# Patient Record
Sex: Male | Born: 1960 | Race: White | Hispanic: No | Marital: Married | Smoking: Current some day smoker
Health system: Southern US, Community
[De-identification: ages and names within clinical notes are randomized; demographics above are authoritative.]

## PROBLEM LIST (undated history)

## (undated) DIAGNOSIS — Z720 Tobacco use: Secondary | ICD-10-CM

## (undated) DIAGNOSIS — I429 Cardiomyopathy, unspecified: Secondary | ICD-10-CM

## (undated) DIAGNOSIS — R112 Nausea with vomiting, unspecified: Secondary | ICD-10-CM

## (undated) DIAGNOSIS — I5043 Acute on chronic combined systolic (congestive) and diastolic (congestive) heart failure: Secondary | ICD-10-CM

## (undated) DIAGNOSIS — I5021 Acute systolic (congestive) heart failure: Secondary | ICD-10-CM

## (undated) DIAGNOSIS — I340 Nonrheumatic mitral (valve) insufficiency: Secondary | ICD-10-CM

## (undated) DIAGNOSIS — Z951 Presence of aortocoronary bypass graft: Secondary | ICD-10-CM

## (undated) DIAGNOSIS — Z9889 Other specified postprocedural states: Secondary | ICD-10-CM

## (undated) DIAGNOSIS — I4891 Unspecified atrial fibrillation: Secondary | ICD-10-CM

## (undated) DIAGNOSIS — I214 Non-ST elevation (NSTEMI) myocardial infarction: Secondary | ICD-10-CM

## (undated) DIAGNOSIS — I639 Cerebral infarction, unspecified: Secondary | ICD-10-CM

## (undated) DIAGNOSIS — E7800 Pure hypercholesterolemia, unspecified: Secondary | ICD-10-CM

## (undated) DIAGNOSIS — I255 Ischemic cardiomyopathy: Secondary | ICD-10-CM

## (undated) DIAGNOSIS — I25110 Atherosclerotic heart disease of native coronary artery with unstable angina pectoris: Secondary | ICD-10-CM

---

## 1989-07-03 HISTORY — PX: KNEE ARTHROSCOPY: SUR90

## 2001-02-22 ENCOUNTER — Emergency Department (HOSPITAL_COMMUNITY): Admission: EM | Admit: 2001-02-22 | Discharge: 2001-02-22 | Payer: Self-pay | Admitting: Emergency Medicine

## 2008-07-10 ENCOUNTER — Emergency Department (HOSPITAL_COMMUNITY): Admission: EM | Admit: 2008-07-10 | Discharge: 2008-07-10 | Payer: Self-pay | Admitting: Emergency Medicine

## 2010-06-29 ENCOUNTER — Other Ambulatory Visit
Admission: RE | Admit: 2010-06-29 | Discharge: 2010-06-29 | Payer: Self-pay | Source: Home / Self Care | Admitting: General Surgery

## 2013-02-10 ENCOUNTER — Ambulatory Visit (INDEPENDENT_AMBULATORY_CARE_PROVIDER_SITE_OTHER): Payer: 59 | Admitting: Family Medicine

## 2013-02-10 ENCOUNTER — Encounter: Payer: Self-pay | Admitting: Family Medicine

## 2013-02-10 VITALS — BP 140/98 | Ht 72.0 in | Wt 232.0 lb

## 2013-02-10 DIAGNOSIS — J310 Chronic rhinitis: Secondary | ICD-10-CM

## 2013-02-10 DIAGNOSIS — J329 Chronic sinusitis, unspecified: Secondary | ICD-10-CM

## 2013-02-10 MED ORDER — AMOXICILLIN-POT CLAVULANATE 875-125 MG PO TABS: 1.0000 | ORAL_TABLET | Freq: Two times a day (BID) | ORAL | Status: AC

## 2013-02-10 NOTE — Patient Instructions (Signed)
Take all the antibiotics  Please consider yearly physicals!

## 2013-02-10 NOTE — Progress Notes (Signed)
  Subjective:    Patient ID: [name] [name], male    DOB: Dec 20, 1960, 52 y.o.   MRN: [id number]  Sinusitis This is a new problem. The problem has been gradually worsening since onset. There has been no fever. His pain is at a severity of 5/10. The pain is moderate. Associated symptoms include congestion, coughing, headaches and a hoarse voice. Past treatments include oral decongestants. The treatment provided mild relief.   Still smokking--maybe a pa per day   Review of Systems  HENT: Positive for congestion and hoarse voice.   Respiratory: Positive for cough.   Neurological: Positive for headaches.       Objective:   Physical Exam  Alert mild malaise. Vitals reviewed. Blood pressure improved on repeat 140/88. Lungs clear other than occasional rhonchi. Heart regular rate and rhythm. Frontal tenderness. Nasal congestion.     Assessment & Plan:  Impression sinusitis-discussed. Slight elevation of blood pressure discussed Plan encouraged to stop smoking. Antibiotics prescribed. Symptomatic care discussed. Wellness exam encourage. WSL

## 2014-07-06 ENCOUNTER — Encounter: Payer: Self-pay | Admitting: Family Medicine

## 2014-07-06 ENCOUNTER — Ambulatory Visit (INDEPENDENT_AMBULATORY_CARE_PROVIDER_SITE_OTHER): Payer: 59 | Admitting: Family Medicine

## 2014-07-06 VITALS — BP 152/90 | Temp 98.6°F | Ht 72.0 in | Wt 227.8 lb

## 2014-07-06 DIAGNOSIS — L03115 Cellulitis of right lower limb: Secondary | ICD-10-CM

## 2014-07-06 MED ORDER — AMOXICILLIN-POT CLAVULANATE 875-125 MG PO TABS: 1.0000 | ORAL_TABLET | Freq: Two times a day (BID) | ORAL | Status: AC

## 2014-07-06 MED ORDER — DOXYCYCLINE HYCLATE 100 MG PO TABS: 100.0000 mg | ORAL_TABLET | Freq: Two times a day (BID) | ORAL | Status: DC

## 2014-07-06 MED ORDER — MUPIROCIN 2 % EX OINT: 1.0000 "application " | TOPICAL_OINTMENT | Freq: Two times a day (BID) | CUTANEOUS | Status: DC

## 2014-07-06 NOTE — Patient Instructions (Signed)
Consider something otc like align to build up the good bacteria

## 2014-07-06 NOTE — Progress Notes (Signed)
   Subjective:    Patient ID: [name] [name], male    DOB: Nov 29, 1960, 54 y.o.   MRN: [id number]  HPI  Patient arrives with infection in right foot for 6 days. The area is under his toes-patient using peroxide. History of fungal infection. Took medicine and pass it caused diarrhea so he stopped.  Tends to sweat a lot.  Often gets athlete's foot between the toes.  Has developed discharge irritation drainage blistering and shedding of skin between the toes. Now accompanied by a substantial odor no fever no chills   Review of Systems Headache no chest pain no back pain ROS otherwise negative    Objective:   Physical Exam  Alert vitals stable H&T normal. Lungs clear heart rare rhythm. Impressive maceration and 10 discharge and substantial odor with swelling of a couple toes. Pulses and sensation good    Assessment & Plan:  Impression maceration with fungal element and now secondary bacterial involvement plan Augmentin and doxy to cover for broad-spectrum of bacteria. Bactroban 3 times a day local measures discussed recheck mid next week warning signs discussed. WSL

## 2014-07-16 ENCOUNTER — Encounter: Payer: Self-pay | Admitting: Family Medicine

## 2014-07-16 ENCOUNTER — Ambulatory Visit (INDEPENDENT_AMBULATORY_CARE_PROVIDER_SITE_OTHER): Payer: 59 | Admitting: Family Medicine

## 2014-07-16 VITALS — BP 130/80 | Ht 72.0 in | Wt 228.2 lb

## 2014-07-16 DIAGNOSIS — L03115 Cellulitis of right lower limb: Secondary | ICD-10-CM

## 2014-07-16 DIAGNOSIS — B353 Tinea pedis: Secondary | ICD-10-CM

## 2014-07-16 MED ORDER — TERBINAFINE HCL 250 MG PO TABS: 250.0000 mg | ORAL_TABLET | Freq: Every day | ORAL | Status: DC

## 2014-07-16 MED ORDER — CEPHALEXIN 500 MG PO CAPS: 500.0000 mg | ORAL_CAPSULE | Freq: Four times a day (QID) | ORAL | Status: DC

## 2014-07-16 MED ORDER — SILDENAFIL CITRATE 50 MG PO TABS: 50.0000 mg | ORAL_TABLET | Freq: Every day | ORAL | Status: DC | PRN

## 2014-07-16 NOTE — Progress Notes (Signed)
   Subjective:    Patient ID: [name] [name], male    DOB: Apr 19, 1961, 54 y.o.   MRN: [id number]  HPI Patient is here today for a recheck of the cellulitis of the right foot. Patient still currently taking Augmentin and Doxycycline. Patient states that he think the area has not improved and is worsening.   Still having discharge but less so. No fever or chest chills.  Long-standing history of probable fungal infection the feet.  Patient states that he has no other concerns at this time.   Patient also expresses substantial concern about erectile dysfunction. This is been progressive in recent years. Has become significantly worse in the past year. Unable to maintain a satisfactory erection.  [phone] Review of Systems No fever no chills no rash elsewhere no headache    Objective:   Physical Exam  Alert no apparent distress lungs clear heart rare rhythm impressive intertrigo rash with discharge swelling and erythema.      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 Rather impressive case. #2 erectile dysfunction discussed at great length. Full 25 minutes spent most in discussion. We'll give a trial of Viagra rationale discussed. Plan recommend podiatry referral. One more round of oral antibiotics. Also initiate oral Lamisil with substantial intertrigo maceration and chronic fungal changes along with blistering.

## 2014-07-22 ENCOUNTER — Other Ambulatory Visit: Payer: Self-pay | Admitting: Family Medicine

## 2014-08-27 ENCOUNTER — Encounter: Payer: Self-pay | Admitting: Family Medicine

## 2015-11-30 ENCOUNTER — Ambulatory Visit (INDEPENDENT_AMBULATORY_CARE_PROVIDER_SITE_OTHER): Payer: 59 | Admitting: Family Medicine

## 2015-11-30 ENCOUNTER — Encounter: Payer: Self-pay | Admitting: Family Medicine

## 2015-11-30 VITALS — BP 144/96 | Temp 98.3°F | Ht 72.0 in | Wt 230.8 lb

## 2015-11-30 DIAGNOSIS — R109 Unspecified abdominal pain: Secondary | ICD-10-CM | POA: Diagnosis not present

## 2015-11-30 DIAGNOSIS — Z13220 Encounter for screening for lipoid disorders: Secondary | ICD-10-CM | POA: Diagnosis not present

## 2015-11-30 DIAGNOSIS — Z125 Encounter for screening for malignant neoplasm of prostate: Secondary | ICD-10-CM | POA: Diagnosis not present

## 2015-11-30 MED ORDER — SUCRALFATE 1 G PO TABS: ORAL_TABLET | ORAL | Status: DC

## 2015-11-30 MED ORDER — PANTOPRAZOLE SODIUM 40 MG PO TBEC: 40.0000 mg | DELAYED_RELEASE_TABLET | Freq: Every day | ORAL | Status: DC

## 2015-11-30 MED ORDER — ONDANSETRON 4 MG PO TBDP: 4.0000 mg | ORAL_TABLET | Freq: Three times a day (TID) | ORAL | Status: DC | PRN

## 2015-11-30 NOTE — Progress Notes (Signed)
   Subjective:    Patient ID: [name] [name], male    DOB: 1961/04/26, 55 y.o.   MRN: [id number]  HPI Patient arrives with c/o abdominal pain, nausea and vomiting for 7-8 weeks.  Pain off and on for 7 or 8 weeks  Pain now worsening   Has had nausea and vom  Night time feels chills and sweating   someitmes worse after meals  Hard time getting a burp etc.  No foods stuck    no hx of h t burn or reflux  Tried no meds other than tyl  Still has gall bladder    Review of Systems No headache, no major weight loss or weight gain, no chest pain no back painNo hip pain pain no change in bowel habits complete ROS otherwise negative     Objective:   Physical Exam Alert no major distress HEENT normal lungs clear heart rare rhythm epigastrium distinctly tender right upper quadrant mild tenderness no rebound no guarding good bowel sounds no organomegaly       Assessment & Plan:  Impression subacute epigastric/right upper quadrant pain on further discussion patient has not had a preventative checkup for long time plan initiate Protonix. Initiate Carafate. Hydrocodone when necessary for pain. Symptom care discussed. Appropriate blood work. Ultrasound of abdomen. Easily 25 minutes spent most in discussion WSL

## 2015-12-01 ENCOUNTER — Other Ambulatory Visit: Payer: Self-pay

## 2015-12-01 ENCOUNTER — Other Ambulatory Visit (HOSPITAL_COMMUNITY)
Admission: RE | Admit: 2015-12-01 | Discharge: 2015-12-01 | Disposition: A | Discharge status: Home / Self Care | Payer: 59 | Source: Ambulatory Visit | Attending: Family Medicine | Admitting: Family Medicine

## 2015-12-01 ENCOUNTER — Ambulatory Visit (HOSPITAL_COMMUNITY)
Admission: RE | Admit: 2015-12-01 | Discharge: 2015-12-01 | Disposition: A | Discharge status: Home / Self Care | Payer: 59 | Source: Ambulatory Visit | Attending: Family Medicine | Admitting: Family Medicine

## 2015-12-01 ENCOUNTER — Telehealth: Payer: Self-pay | Admitting: Family Medicine

## 2015-12-01 DIAGNOSIS — D72829 Elevated white blood cell count, unspecified: Secondary | ICD-10-CM | POA: Insufficient documentation

## 2015-12-01 DIAGNOSIS — R1011 Right upper quadrant pain: Secondary | ICD-10-CM | POA: Diagnosis not present

## 2015-12-01 DIAGNOSIS — Z125 Encounter for screening for malignant neoplasm of prostate: Secondary | ICD-10-CM | POA: Insufficient documentation

## 2015-12-01 DIAGNOSIS — R109 Unspecified abdominal pain: Secondary | ICD-10-CM | POA: Insufficient documentation

## 2015-12-01 DIAGNOSIS — R112 Nausea with vomiting, unspecified: Secondary | ICD-10-CM | POA: Diagnosis not present

## 2015-12-01 DIAGNOSIS — Z79899 Other long term (current) drug therapy: Secondary | ICD-10-CM | POA: Insufficient documentation

## 2015-12-01 DIAGNOSIS — E785 Hyperlipidemia, unspecified: Secondary | ICD-10-CM | POA: Diagnosis not present

## 2015-12-01 LAB — LIPID PANEL
CHOL/HDL RATIO: 6.3 ratio
CHOLESTEROL: 171 mg/dL (ref 0–200)
HDL: 27 mg/dL — ABNORMAL LOW (ref >40)
LDL CALC: 121 mg/dL — AB (ref 0–99)
Triglycerides: 116 mg/dL (ref <150)
VLDL: 23 mg/dL (ref 0–40)

## 2015-12-01 LAB — CBC WITH DIFFERENTIAL/PLATELET
BASOS PCT: 0 %
Basophils Absolute: 0 10*3/uL (ref 0.0–0.1)
EOS ABS: 0.1 10*3/uL (ref 0.0–0.7)
Eosinophils Relative: 1 %
HCT: 41.3 % (ref 39.0–52.0)
Hemoglobin: 14.1 g/dL (ref 13.0–17.0)
Lymphocytes Relative: 19 %
Lymphs Abs: 1.6 10*3/uL (ref 0.7–4.0)
MCH: 31.7 pg (ref 26.0–34.0)
MCHC: 34.1 g/dL (ref 30.0–36.0)
MCV: 92.8 fL (ref 78.0–100.0)
MONO ABS: 0.6 10*3/uL (ref 0.1–1.0)
MONOS PCT: 7 %
NEUTROS PCT: 73 %
Neutro Abs: 6.1 10*3/uL (ref 1.7–7.7)
Platelets: 245 10*3/uL (ref 150–400)
RBC: 4.45 MIL/uL (ref 4.22–5.81)
RDW: 13.2 % (ref 11.5–15.5)
WBC: 8.5 10*3/uL (ref 4.0–10.5)

## 2015-12-01 LAB — AMYLASE: AMYLASE: 29 U/L (ref 28–100)

## 2015-12-01 LAB — BASIC METABOLIC PANEL
Anion gap: 7 (ref 5–15)
BUN: 11 mg/dL (ref 6–20)
CALCIUM: 8.6 mg/dL — AB (ref 8.9–10.3)
CO2: 24 mmol/L (ref 22–32)
CREATININE: 1.1 mg/dL (ref 0.61–1.24)
Chloride: 106 mmol/L (ref 101–111)
Glucose, Bld: 142 mg/dL — ABNORMAL HIGH (ref 65–99)
Potassium: 4.7 mmol/L (ref 3.5–5.1)
Sodium: 137 mmol/L (ref 135–145)

## 2015-12-01 LAB — HEPATIC FUNCTION PANEL
ALBUMIN: 4 g/dL (ref 3.5–5.0)
ALK PHOS: 91 U/L (ref 38–126)
ALT: 25 U/L (ref 17–63)
AST: 21 U/L (ref 15–41)
Bilirubin, Direct: 0.3 mg/dL (ref 0.1–0.5)
Indirect Bilirubin: 1.2 mg/dL — ABNORMAL HIGH (ref 0.3–0.9)
TOTAL PROTEIN: 7.3 g/dL (ref 6.5–8.1)
Total Bilirubin: 1.5 mg/dL — ABNORMAL HIGH (ref 0.3–1.2)

## 2015-12-01 LAB — PSA: PSA: 0.73 ng/mL (ref 0.00–4.00)

## 2015-12-01 LAB — LIPASE, BLOOD: LIPASE: 15 U/L (ref 11–51)

## 2015-12-01 MED ORDER — HYDROCODONE-ACETAMINOPHEN 5-325 MG PO TABS: 1.0000 | ORAL_TABLET | Freq: Four times a day (QID) | ORAL | Status: DC | PRN

## 2015-12-01 NOTE — Telephone Encounter (Signed)
In fact gdt temp and call us ba k if substantial fever we may need to do addtno intervention today

## 2015-12-01 NOTE — Telephone Encounter (Signed)
pts spouse is calling in to see if maybe he could have something for pain He is at home with chills, in pain (breathing through the pain) spouse was  Unable to check temp but stated he was very hot   He is going to take the rest of the week off because he feels so bad   Had is [place] changed to [facility] this morning and had blood work done  Already.    [facility] if he can get something for pain

## 2015-12-01 NOTE — Telephone Encounter (Signed)
Spoke with patient and informed him per Dr.Steve Luking- get temperature checked and call us back if substantial fever we may need to do additional intervention today. Patient verbalized understanding and stated that he would call back with temperature but at the current moment he feels fine,symptoms only worsening at night.

## 2015-12-03 ENCOUNTER — Ambulatory Visit: Payer: 59 | Admitting: Family Medicine

## 2015-12-03 ENCOUNTER — Ambulatory Visit (HOSPITAL_COMMUNITY): Payer: Self-pay

## 2015-12-13 ENCOUNTER — Encounter (HOSPITAL_COMMUNITY): Payer: Self-pay | Admitting: Emergency Medicine

## 2015-12-13 ENCOUNTER — Ambulatory Visit (INDEPENDENT_AMBULATORY_CARE_PROVIDER_SITE_OTHER): Payer: 59 | Admitting: Family Medicine

## 2015-12-13 ENCOUNTER — Emergency Department (HOSPITAL_COMMUNITY): Payer: 59

## 2015-12-13 ENCOUNTER — Encounter: Payer: Self-pay | Admitting: Family Medicine

## 2015-12-13 ENCOUNTER — Inpatient Hospital Stay (HOSPITAL_COMMUNITY)
Admission: EM | Admit: 2015-12-13 | Discharge: 2015-12-22 | DRG: 233 | Disposition: A | Discharge status: Home Health | Payer: 59 | Attending: Thoracic Surgery (Cardiothoracic Vascular Surgery) | Admitting: Thoracic Surgery (Cardiothoracic Vascular Surgery)

## 2015-12-13 VITALS — BP 138/80 | Ht 72.0 in | Wt 228.4 lb

## 2015-12-13 DIAGNOSIS — I480 Paroxysmal atrial fibrillation: Secondary | ICD-10-CM | POA: Diagnosis not present

## 2015-12-13 DIAGNOSIS — Z7982 Long term (current) use of aspirin: Secondary | ICD-10-CM

## 2015-12-13 DIAGNOSIS — I499 Cardiac arrhythmia, unspecified: Secondary | ICD-10-CM

## 2015-12-13 DIAGNOSIS — I481 Persistent atrial fibrillation: Secondary | ICD-10-CM

## 2015-12-13 DIAGNOSIS — E7800 Pure hypercholesterolemia, unspecified: Secondary | ICD-10-CM | POA: Diagnosis present

## 2015-12-13 DIAGNOSIS — D72829 Elevated white blood cell count, unspecified: Secondary | ICD-10-CM

## 2015-12-13 DIAGNOSIS — R112 Nausea with vomiting, unspecified: Secondary | ICD-10-CM | POA: Diagnosis not present

## 2015-12-13 DIAGNOSIS — I214 Non-ST elevation (NSTEMI) myocardial infarction: Principal | ICD-10-CM | POA: Diagnosis present

## 2015-12-13 DIAGNOSIS — I4891 Unspecified atrial fibrillation: Secondary | ICD-10-CM

## 2015-12-13 DIAGNOSIS — Z79899 Other long term (current) drug therapy: Secondary | ICD-10-CM

## 2015-12-13 DIAGNOSIS — I5023 Acute on chronic systolic (congestive) heart failure: Secondary | ICD-10-CM | POA: Diagnosis not present

## 2015-12-13 DIAGNOSIS — I2510 Atherosclerotic heart disease of native coronary artery without angina pectoris: Secondary | ICD-10-CM | POA: Diagnosis not present

## 2015-12-13 DIAGNOSIS — R7989 Other specified abnormal findings of blood chemistry: Secondary | ICD-10-CM

## 2015-12-13 DIAGNOSIS — Z0000 Encounter for general adult medical examination without abnormal findings: Secondary | ICD-10-CM

## 2015-12-13 DIAGNOSIS — I110 Hypertensive heart disease with heart failure: Secondary | ICD-10-CM | POA: Diagnosis present

## 2015-12-13 DIAGNOSIS — I4819 Other persistent atrial fibrillation: Secondary | ICD-10-CM

## 2015-12-13 DIAGNOSIS — I5021 Acute systolic (congestive) heart failure: Secondary | ICD-10-CM

## 2015-12-13 DIAGNOSIS — D62 Acute posthemorrhagic anemia: Secondary | ICD-10-CM | POA: Diagnosis not present

## 2015-12-13 DIAGNOSIS — I340 Nonrheumatic mitral (valve) insufficiency: Secondary | ICD-10-CM | POA: Diagnosis present

## 2015-12-13 DIAGNOSIS — J9811 Atelectasis: Secondary | ICD-10-CM

## 2015-12-13 DIAGNOSIS — K219 Gastro-esophageal reflux disease without esophagitis: Secondary | ICD-10-CM | POA: Diagnosis present

## 2015-12-13 DIAGNOSIS — I509 Heart failure, unspecified: Secondary | ICD-10-CM | POA: Diagnosis not present

## 2015-12-13 DIAGNOSIS — Z720 Tobacco use: Secondary | ICD-10-CM

## 2015-12-13 DIAGNOSIS — I25110 Atherosclerotic heart disease of native coronary artery with unstable angina pectoris: Secondary | ICD-10-CM | POA: Diagnosis present

## 2015-12-13 DIAGNOSIS — I272 Other secondary pulmonary hypertension: Secondary | ICD-10-CM | POA: Diagnosis present

## 2015-12-13 DIAGNOSIS — I6522 Occlusion and stenosis of left carotid artery: Secondary | ICD-10-CM | POA: Diagnosis not present

## 2015-12-13 DIAGNOSIS — I248 Other forms of acute ischemic heart disease: Secondary | ICD-10-CM | POA: Diagnosis not present

## 2015-12-13 DIAGNOSIS — Z951 Presence of aortocoronary bypass graft: Secondary | ICD-10-CM

## 2015-12-13 DIAGNOSIS — R778 Other specified abnormalities of plasma proteins: Secondary | ICD-10-CM | POA: Diagnosis present

## 2015-12-13 DIAGNOSIS — R7301 Impaired fasting glucose: Secondary | ICD-10-CM | POA: Diagnosis not present

## 2015-12-13 DIAGNOSIS — I429 Cardiomyopathy, unspecified: Secondary | ICD-10-CM

## 2015-12-13 DIAGNOSIS — I255 Ischemic cardiomyopathy: Secondary | ICD-10-CM | POA: Diagnosis not present

## 2015-12-13 DIAGNOSIS — R0602 Shortness of breath: Secondary | ICD-10-CM | POA: Diagnosis not present

## 2015-12-13 DIAGNOSIS — Z8249 Family history of ischemic heart disease and other diseases of the circulatory system: Secondary | ICD-10-CM

## 2015-12-13 DIAGNOSIS — F17210 Nicotine dependence, cigarettes, uncomplicated: Secondary | ICD-10-CM | POA: Diagnosis present

## 2015-12-13 DIAGNOSIS — E669 Obesity, unspecified: Secondary | ICD-10-CM

## 2015-12-13 DIAGNOSIS — Z419 Encounter for procedure for purposes other than remedying health state, unspecified: Secondary | ICD-10-CM

## 2015-12-13 HISTORY — DX: Non-ST elevation (NSTEMI) myocardial infarction: I21.4

## 2015-12-13 HISTORY — DX: Pure hypercholesterolemia, unspecified: E78.00

## 2015-12-13 HISTORY — DX: Nonrheumatic mitral (valve) insufficiency: I34.0

## 2015-12-13 HISTORY — DX: Cardiomyopathy, unspecified: I42.9

## 2015-12-13 HISTORY — DX: Atherosclerotic heart disease of native coronary artery with unstable angina pectoris: I25.110

## 2015-12-13 HISTORY — DX: Acute systolic (congestive) heart failure: I50.21

## 2015-12-13 HISTORY — DX: Ischemic cardiomyopathy: I25.5

## 2015-12-13 HISTORY — DX: Unspecified atrial fibrillation: I48.91

## 2015-12-13 HISTORY — DX: Tobacco use: Z72.0

## 2015-12-13 HISTORY — DX: Presence of aortocoronary bypass graft: Z95.1

## 2015-12-13 LAB — CBC
HEMATOCRIT: 39.1 % (ref 39.0–52.0)
HEMOGLOBIN: 13.5 g/dL (ref 13.0–17.0)
MCH: 31.5 pg (ref 26.0–34.0)
MCHC: 34.5 g/dL (ref 30.0–36.0)
MCV: 91.1 fL (ref 78.0–100.0)
Platelets: 365 10*3/uL (ref 150–400)
RBC: 4.29 MIL/uL (ref 4.22–5.81)
RDW: 13.1 % (ref 11.5–15.5)
WBC: 13.8 10*3/uL — ABNORMAL HIGH (ref 4.0–10.5)

## 2015-12-13 LAB — BASIC METABOLIC PANEL
Anion gap: 10 (ref 5–15)
BUN: 13 mg/dL (ref 6–20)
CHLORIDE: 102 mmol/L (ref 101–111)
CO2: 23 mmol/L (ref 22–32)
Calcium: 8.7 mg/dL — ABNORMAL LOW (ref 8.9–10.3)
Creatinine, Ser: 1.03 mg/dL (ref 0.61–1.24)
GFR calc Af Amer: >60 mL/min (ref >60)
GLUCOSE: 113 mg/dL — AB (ref 65–99)
POTASSIUM: 4 mmol/L (ref 3.5–5.1)
Sodium: 135 mmol/L (ref 135–145)

## 2015-12-13 LAB — POCT GLYCOSYLATED HEMOGLOBIN (HGB A1C): HEMOGLOBIN A1C: 4.8

## 2015-12-13 LAB — PROTIME-INR
INR: 1.12 (ref 0.00–1.49)
Prothrombin Time: 14.6 seconds (ref 11.6–15.2)

## 2015-12-13 LAB — TROPONIN I
TROPONIN I: 0.36 ng/mL — AB (ref <0.031)
Troponin I: 0.07 ng/mL — ABNORMAL HIGH (ref <0.031)
Troponin I: 0.57 ng/mL (ref <0.031)

## 2015-12-13 LAB — BRAIN NATRIURETIC PEPTIDE: B NATRIURETIC PEPTIDE 5: 625 pg/mL — AB (ref 0.0–100.0)

## 2015-12-13 LAB — TSH: TSH: 1.552 u[IU]/mL (ref 0.350–4.500)

## 2015-12-13 LAB — MAGNESIUM: Magnesium: 2 mg/dL (ref 1.7–2.4)

## 2015-12-13 MED ORDER — PANTOPRAZOLE SODIUM 40 MG PO TBEC
40.0000 mg | DELAYED_RELEASE_TABLET | Freq: Every day | ORAL | Status: DC
  Administered 2015-12-13 – 2015-12-16 (×4): 40 mg via ORAL
  Filled 2015-12-13 (×4): qty 1

## 2015-12-13 MED ORDER — ONDANSETRON HCL 4 MG/2ML IJ SOLN: 4.0000 mg | Freq: Four times a day (QID) | INTRAMUSCULAR | Status: DC | PRN

## 2015-12-13 MED ORDER — ASPIRIN EC 325 MG PO TBEC
325.0000 mg | DELAYED_RELEASE_TABLET | Freq: Every day | ORAL | Status: DC
  Administered 2015-12-14 – 2015-12-15 (×2): 325 mg via ORAL
  Filled 2015-12-13 (×2): qty 1

## 2015-12-13 MED ORDER — ACETAMINOPHEN 500 MG PO TABS: 500.0000 mg | ORAL_TABLET | Freq: Four times a day (QID) | ORAL | Status: DC | PRN

## 2015-12-13 MED ORDER — DILTIAZEM LOAD VIA INFUSION
20.0000 mg | Freq: Once | INTRAVENOUS | Status: AC
  Administered 2015-12-13: 20 mg via INTRAVENOUS
  Filled 2015-12-13: qty 20

## 2015-12-13 MED ORDER — SODIUM CHLORIDE 0.9% FLUSH: 3.0000 mL | INTRAVENOUS | Status: DC | PRN

## 2015-12-13 MED ORDER — DILTIAZEM HCL 100 MG IV SOLR
5.0000 mg/h | INTRAVENOUS | Status: DC
  Administered 2015-12-13: 5 mg/h via INTRAVENOUS
  Filled 2015-12-13 (×2): qty 100

## 2015-12-13 MED ORDER — SODIUM CHLORIDE 0.9 % IV SOLN: 250.0000 mL | INTRAVENOUS | Status: DC | PRN

## 2015-12-13 MED ORDER — ACETAMINOPHEN 325 MG PO TABS: 650.0000 mg | ORAL_TABLET | ORAL | Status: DC | PRN

## 2015-12-13 MED ORDER — ENOXAPARIN SODIUM 100 MG/ML ~~LOC~~ SOLN
100.0000 mg | Freq: Two times a day (BID) | SUBCUTANEOUS | Status: DC
  Administered 2015-12-14: 100 mg via SUBCUTANEOUS
  Filled 2015-12-13: qty 1

## 2015-12-13 MED ORDER — MORPHINE SULFATE (PF) 2 MG/ML IV SOLN: 2.0000 mg | INTRAVENOUS | Status: DC | PRN

## 2015-12-13 MED ORDER — FUROSEMIDE 10 MG/ML IJ SOLN
20.0000 mg | Freq: Once | INTRAMUSCULAR | Status: AC
  Administered 2015-12-13: 20 mg via INTRAVENOUS
  Filled 2015-12-13: qty 2

## 2015-12-13 MED ORDER — ENOXAPARIN SODIUM 100 MG/ML ~~LOC~~ SOLN
100.0000 mg | Freq: Once | SUBCUTANEOUS | Status: AC
  Administered 2015-12-13: 100 mg via SUBCUTANEOUS
  Filled 2015-12-13: qty 1

## 2015-12-13 MED ORDER — LISINOPRIL 2.5 MG PO TABS
2.5000 mg | ORAL_TABLET | Freq: Every day | ORAL | Status: DC
  Administered 2015-12-14 – 2015-12-16 (×3): 2.5 mg via ORAL
  Filled 2015-12-13 (×3): qty 1

## 2015-12-13 MED ORDER — SODIUM CHLORIDE 0.9% FLUSH
3.0000 mL | Freq: Two times a day (BID) | INTRAVENOUS | Status: DC
  Administered 2015-12-13 – 2015-12-16 (×4): 3 mL via INTRAVENOUS

## 2015-12-13 NOTE — Progress Notes (Signed)
ANTICOAGULATION CONSULT NOTE - Initial Consult  Pharmacy Consult for Lovenox Indication: atrial fibrillation  No Known Allergies  Patient Measurements: Height: 6' (182.9 cm) Weight: 225 lb 15.5 oz (102.5 kg) IBW/kg (Calculated) : 77.6 HEPARIN DW (KG): 98.6   Vital Signs: Temp: 97.3 F (36.3 C) (06/12 2035) Temp Source: Oral (06/12 2035) BP: 114/85 mmHg (06/12 2100) Pulse Rate: 79 (06/12 2100)  Labs:  Recent Labs  12/13/15 1645 12/13/15 2012  HGB 13.5  --   HCT 39.1  --   PLT 365  --   LABPROT 14.6  --   INR 1.12  --   CREATININE 1.03  --   TROPONINI 0.07* 0.36*   Estimated Creatinine Clearance: 102.8 mL/min (by C-G formula based on Cr of 1.03).  Medical History: Past Medical History  Diagnosis Date  . GERD (gastroesophageal reflux disease)   . Tobacco abuse    Medications:  Prescriptions prior to admission  Medication Sig Dispense Refill Last Dose  . acetaminophen (TYLENOL) 500 MG tablet Take 500 mg by mouth every 6 (six) hours as needed for mild pain.   Past Week at Unknown time  . aspirin EC 81 MG tablet Take 324 mg by mouth once.   12/13/2015 at Unknown time  . pantoprazole (PROTONIX) 40 MG tablet Take 1 tablet (40 mg total) by mouth daily. 30 tablet 0 Past Week at Unknown time  . HYDROcodone-acetaminophen (NORCO/VICODIN) 5-325 MG tablet Take 1 tablet by mouth every 6 (six) hours as needed for moderate pain. (Patient not taking: Reported on 12/13/2015) 24 tablet 0 Taking  . mupirocin ointment (BACTROBAN) 2 % PLACE 1 APPLICATION INTO THE NOSE 2 TIMES DAILY. (Patient not taking: Reported on 12/13/2015) 22 g 5 Not Taking  . ondansetron (ZOFRAN ODT) 4 MG disintegrating tablet Take 1 tablet (4 mg total) by mouth every 8 (eight) hours as needed for nausea or vomiting. (Patient not taking: Reported on 12/13/2015) 15 tablet 0 Not Taking  . sucralfate (CARAFATE) 1 g tablet One tablet ac and hs-mix in 2 oz water (Patient not taking: Reported on 12/13/2015) 60 tablet 0 Taking    Assessment: Okay for Protocol, cardiology consult in AM.  Goal of Therapy:  Anti-Xa level 0.6-1 units/ml 4hrs after LMWH dose given (if clinically indicated) Monitor platelets by anticoagulation protocol: Yes   Plan:  Lovenox 100mg  sq every 12 hours. Monitor for signs and symptoms of bleeding.  F/U long term anticoagulation plan.  Pricilla Larsson 12/13/2015,9:30 PM

## 2015-12-13 NOTE — ED Provider Notes (Signed)
CSN: [id number]     Arrival date & time 12/13/15  1618 History   First MD Initiated Contact with Patient 12/13/15 1631     Chief Complaint  Patient presents with  . Abnormal ECG   HPI Patient presents to the emergency room for evaluation of an abnormal EKG. The patient has been having some trouble with pain in his right side, flank area the last few weeks. He was seen by his primary care doctor and had an evaluation for possible gallbladder issues.  Pt had an evaluation that was negative.  Over the last week he has noticed dyspnea with exertion.  He has had some discomfort in the epigastric region and chest.  He went to see Dr [name] today and was noted to be in a fib RVR with abnormal EKG findings.  Pt denies any chest pain now.  He has not realized that his heart has been racing. History reviewed. No pertinent past medical history. History reviewed. No pertinent past surgical history. History reviewed. No pertinent family history. Social History  Substance Use Topics  . Smoking status: Current Some Day Smoker -- 1.00 packs/day    Types: Cigarettes  . Smokeless tobacco: None  . Alcohol Use: No    Review of Systems  All other systems reviewed and are negative.     Allergies  Review of patient's allergies indicates no known allergies.  Home Medications   Prior to Admission medications   Medication Sig Start Date End Date Taking? Authorizing Provider  acetaminophen (TYLENOL) 500 MG tablet Take 500 mg by mouth every 6 (six) hours as needed for mild pain.   Yes Historical Provider, MD  aspirin EC 81 MG tablet Take 324 mg by mouth once.   Yes Historical Provider, MD  pantoprazole (PROTONIX) 40 MG tablet Take 1 tablet (40 mg total) by mouth daily. 11/30/15  Yes Mikey Kirschner, MD  HYDROcodone-acetaminophen (NORCO/VICODIN) 5-325 MG tablet Take 1 tablet by mouth every 6 (six) hours as needed for moderate pain. Patient not taking: Reported on 12/13/2015 12/01/15   Mikey Kirschner, MD   mupirocin ointment (BACTROBAN) 2 % PLACE 1 APPLICATION INTO THE NOSE 2 TIMES DAILY. Patient not taking: Reported on 12/13/2015 07/22/14   Mikey Kirschner, MD  ondansetron (ZOFRAN ODT) 4 MG disintegrating tablet Take 1 tablet (4 mg total) by mouth every 8 (eight) hours as needed for nausea or vomiting. Patient not taking: Reported on 12/13/2015 11/30/15   Mikey Kirschner, MD  sucralfate (CARAFATE) 1 g tablet One tablet ac and hs-mix in 2 oz water Patient not taking: Reported on 12/13/2015 11/30/15   Mikey Kirschner, MD   BP 116/78 mmHg  Pulse 72  Temp(Src) 98.2 F (36.8 C) (Oral)  Resp 20  Ht 6' (1.829 m)  Wt 103.42 kg  BMI 30.92 kg/m2  SpO2 99% Physical Exam  Constitutional: He appears well-developed and well-nourished. No distress.  HENT:  Head: Normocephalic and atraumatic.  Right Ear: External ear normal.  Left Ear: External ear normal.  Eyes: Conjunctivae are normal. Right eye exhibits no discharge. Left eye exhibits no discharge. No scleral icterus.  Neck: Neck supple. No tracheal deviation present.  Cardiovascular: Intact distal pulses.  An irregularly irregular rhythm present. Tachycardia present.   Pulmonary/Chest: Effort normal and breath sounds normal. No stridor. No respiratory distress. He has no wheezes. He has no rales.  Abdominal: Soft. Bowel sounds are normal. He exhibits no distension. There is no tenderness. There is no rebound and no guarding.  Musculoskeletal: He exhibits no edema or tenderness.  Neurological: He is alert. He has normal strength. No cranial nerve deficit (no facial droop, extraocular movements intact, no slurred speech) or sensory deficit. He exhibits normal muscle tone. He displays no seizure activity. Coordination normal.  Skin: Skin is warm and dry. No rash noted.  Psychiatric: He has a normal mood and affect.  Nursing note and vitals reviewed.   ED Course  .Critical Care Performed by: Dorie Rank Authorized by: Dorie Rank Total critical  care time: 30 minutes Critical care was necessary to treat or prevent imminent or life-threatening deterioration of the following conditions: a fib rvr. Critical care was time spent personally by me on the following activities: discussions with primary provider, evaluation of patient's response to treatment, examination of patient, ordering and performing treatments and interventions, obtaining history from patient or surrogate, ordering and review of laboratory studies, ordering and review of radiographic studies, re-evaluation of patient's condition and pulse oximetry.     ED Meds Medications  diltiazem (CARDIZEM) 1 mg/mL load via infusion 20 mg (20 mg Intravenous Given 12/13/15 1701)    And  diltiazem (CARDIZEM) 100 mg in dextrose 5 % 100 mL (1 mg/mL) infusion (7.5 mg/hr Intravenous Rate/Dose Change 12/13/15 1731)  enoxaparin (LOVENOX) injection 100 mg (100 mg Subcutaneous Given 12/13/15 1828)    Labs Review Labs Reviewed  BASIC METABOLIC PANEL - Abnormal; Notable for the following:    Glucose, Bld 113 (*)    Calcium 8.7 (*)    All other components within normal limits  CBC - Abnormal; Notable for the following:    WBC 13.8 (*)    All other components within normal limits  TROPONIN I - Abnormal; Notable for the following:    Troponin I 0.07 (*)    All other components within normal limits  BRAIN NATRIURETIC PEPTIDE - Abnormal; Notable for the following:    B Natriuretic Peptide 625.0 (*)    All other components within normal limits  PROTIME-INR  MAGNESIUM    Imaging Review Dg Chest Port 1 View  12/13/2015  CLINICAL DATA:  Shortness of Breath and atrial fibrillation, history of tobacco use EXAM: PORTABLE CHEST 1 VIEW COMPARISON:  None. FINDINGS: Cardiac shadow is mildly enlarged. Mild vascular congestion is noted without focal infiltrate. No pulmonary edema is seen. No bony abnormality is noted. IMPRESSION: Mild vascular congestion. Electronically Signed   By: Inez Catalina M.D.    On: 12/13/2015 17:05   I have personally reviewed and evaluated these images and lab results as part of my medical decision-making.   EKG Interpretation   Date/Time:  Monday December 13 2015 16:20:40 EDT Ventricular Rate:  147 PR Interval:    QRS Duration: 92 QT Interval:  288 QTC Calculation: 450 R Axis:   86 Text Interpretation:  Atrial fibrillation with rapid ventricular response  Possible Inferior infarct , age undetermined Possible Anterior infarct ,  age undetermined ST \\T \ T wave abnormality, consider lateral ischemia  Abnormal ECG No previous tracing Confirmed by Maxden Naji  MD-J, Analyce Tavares [id number]) on  12/13/2015 4:39:29 PM    EKG Interpretation  Date/Time:  Monday December 13 2015 18:18:42 EDT Ventricular Rate:  75 PR Interval:    QRS Duration: 102 QT Interval:  423 QTC Calculation: 472 R Axis:   89 Text Interpretation:  Atrial fibrillation Probable inferior infarct, old Anterolateral infarct, age indeterminate Since last tracing rate slower Confirmed by Tiziana Cislo  MD-J, Mahonri Seiden [id number]) on 12/13/2015 6:23:19 PM  MDM   Final diagnoses:  Atrial fibrillation, rapid ([place])    Pt presents to the ED with new onset a fib RVR.  Onset is unknown.  HR has improved with cardizem infusion. Trop mildly elevated but likely related to the rate.  Component of CHF likely related to the a fib RVR.  Will repeat EKG and serial enzymes.  Consult with hospitalist for admission for further cardiac workup.  CHadsVasc =0  Will give dose of lovenox in the ED.    Dorie Rank, MD 12/13/15 [phone]

## 2015-12-13 NOTE — Progress Notes (Signed)
Subjective:    Patient ID: [name] [name], male    DOB: 1961/04/12, 55 y.o.   MRN: [id number]  HPI The patient comes in today for a wellness visit.  Took protonix daily and took carafate fairly reg until five d ago and so stopped the carafate   A review of their health history was completed.  A review of medications was also completed.  Any needed refills: none  Eating habits: not good  Falls/  MVA accidents in past few months: none  Regular exercise: sometimes  Specialist pt sees on regular basis: none  Preventative health issues were discussed.   Additional concerns: none  Patient has not had a colonoscopy.  fri night uncomfortable mid epigastric then rad to belly button  bwoel movement s  No colonoscopy yet   Pos smoker, maybe one beer per wk  No major change with meals  Results for orders placed or performed during the hospital encounter of 12/01/15  PSA  Result Value Ref Range   PSA 0.73 0.00 - 4.00 ng/mL  Hepatic function panel  Result Value Ref Range   Total Protein 7.3 6.5 - 8.1 g/dL   Albumin 4.0 3.5 - 5.0 g/dL   AST 21 15 - 41 U/L   ALT 25 17 - 63 U/L   Alkaline Phosphatase 91 38 - 126 U/L   Total Bilirubin 1.5 (H) 0.3 - 1.2 mg/dL   Bilirubin, Direct 0.3 0.1 - 0.5 mg/dL   Indirect Bilirubin 1.2 (H) 0.3 - 0.9 mg/dL  Lipid panel  Result Value Ref Range   Cholesterol 171 0 - 200 mg/dL   Triglycerides 116 <150 mg/dL   HDL 27 (L) >40 mg/dL   Total CHOL/HDL Ratio 6.3 RATIO   VLDL 23 0 - 40 mg/dL   LDL Cholesterol 121 (H) 0 - 99 mg/dL  Basic metabolic panel  Result Value Ref Range   Sodium 137 135 - 145 mmol/L   Potassium 4.7 3.5 - 5.1 mmol/L   Chloride 106 101 - 111 mmol/L   CO2 24 22 - 32 mmol/L   Glucose, Bld 142 (H) 65 - 99 mg/dL   BUN 11 6 - 20 mg/dL   Creatinine, Ser 1.10 0.61 - 1.24 mg/dL   Calcium 8.6 (L) 8.9 - 10.3 mg/dL   GFR calc non Af Amer >60 >60 mL/min   GFR calc Af Amer >60 >60 mL/min   Anion gap 7 5 - 15  CBC with  Differential/Platelet  Result Value Ref Range   WBC 8.5 4.0 - 10.5 K/uL   RBC 4.45 4.22 - 5.81 MIL/uL   Hemoglobin 14.1 13.0 - 17.0 g/dL   HCT 41.3 39.0 - 52.0 %   MCV 92.8 78.0 - 100.0 fL   MCH 31.7 26.0 - 34.0 pg   MCHC 34.1 30.0 - 36.0 g/dL   RDW 13.2 11.5 - 15.5 %   Platelets 245 150 - 400 K/uL   Neutrophils Relative % 73 %   Neutro Abs 6.1 1.7 - 7.7 K/uL   Lymphocytes Relative 19 %   Lymphs Abs 1.6 0.7 - 4.0 K/uL   Monocytes Relative 7 %   Monocytes Absolute 0.6 0.1 - 1.0 K/uL   Eosinophils Relative 1 %   Eosinophils Absolute 0.1 0.0 - 0.7 K/uL   Basophils Relative 0 %   Basophils Absolute 0.0 0.0 - 0.1 K/uL  Lipase, blood  Result Value Ref Range   Lipase 15 11 - 51 U/L  Amylase  Result Value Ref Range  Amylase 29 28 - 100 U/L   Results for orders placed or performed in visit on 12/13/15  POCT glycosylated hemoglobin (Hb A1C)  Result Value Ref Range   Hemoglobin A1C 4.8     Review of Systems  Constitutional: Negative for fever, activity change and appetite change.  HENT: Negative for congestion and rhinorrhea.   Eyes: Negative for discharge.  Respiratory: Negative for cough and wheezing.   Cardiovascular: Negative for chest pain.  Gastrointestinal: Negative for vomiting, abdominal pain and blood in stool.  Genitourinary: Negative for frequency and difficulty urinating.  Musculoskeletal: Negative for neck pain.  Skin: Negative for rash.  Allergic/Immunologic: Negative for environmental allergies and food allergies.  Neurological: Negative for weakness and headaches.  Psychiatric/Behavioral: Negative for agitation.  All other systems reviewed and are negative.      Objective:   Physical Exam  Constitutional: He appears well-developed and well-nourished.  HENT:  Head: Normocephalic and atraumatic.  Right Ear: External ear normal.  Left Ear: External ear normal.  Nose: Nose normal.  Mouth/Throat: Oropharynx is clear and moist.  Eyes: EOM are normal.  Pupils are equal, round, and reactive to light.  Neck: Normal range of motion. Neck supple. No thyromegaly present.  Cardiovascular: Normal rate, regular rhythm and normal heart sounds.   No murmur heard. Heart rate rapid and irregular  Pulmonary/Chest: Effort normal and breath sounds normal. No respiratory distress. He has no wheezes.  Abdominal: Soft. Bowel sounds are normal. He exhibits no distension and no mass. There is no tenderness.  Genitourinary: Penis normal.  Musculoskeletal: Normal range of motion. He exhibits no edema.  Trace edema bilateral  Lymphadenopathy:    He has no cervical adenopathy.  Neurological: He is alert. He exhibits normal muscle tone.  Skin: Skin is warm and dry. No erythema.  Psychiatric: He has a normal mood and affect. His behavior is normal. Judgment normal.  Vitals reviewed.   EKG atrial fibrillation with rapid response Q waves inferiorly      Assessment & Plan:  Impression 1 well adult exam. Screening blood work discussed. Patient definitely needs to get on with colonoscopy discussed #2 more urgently new onset atrial fibrillation with poor control. Patient expressing orthopnea swollen legs and in an exertional component. Concerning not only for A. fib but potential for ischemic component, discussed with patient. I spoke to emergency room. Patient needs to go directly there for further urgent intervention plan also encouraged to stop smoking. Told him I see him when he gets back. We obviously will have a lot to intervene with. Blood work discussed. HDL very well and a separate risk factor discussed

## 2015-12-13 NOTE — Progress Notes (Signed)
CRITICAL VALUE ALERT  Critical value received:  Troponin .57  Date of notification:  6/12  Time of notification: 2338  Critical value read back: yes  Nurse who received alert:  Dustin Folks RN  MD notified (1st page):  Fuller Plan (In person)   Time of first page:    MD notified (2nd page):  Time of second page:  Responding MD:    Time MD responded:   New orders received, will continue to monitor the patient closely.

## 2015-12-13 NOTE — H&P (Signed)
History and Physical    [place] [id number] DOB: 03-Aug-1960 DOA: 12/13/2015  Referring MD/NP/PA: Dr. Tomi Bamberger PCP: Mickie Hillier, MD  Patient coming from: Dr. [name] Office   Chief Complaint: Abnormal EKG  HPI: [name] [name] is a 55 y.o. male with medical history significant of tobacco abuse and GERD; who presents from his primary care office after being found have a elevated heart rate and abnormal EKG. Symptoms had initially started with complaints of right flank pain which started a few weeks ago. Evaluate weighted by his PCP at that time and workup was negative. He was started on Protonix for suspected acid reflux. The right sided flank pain had moved more epigastrically and over the last week symptoms have progressively worsened to the point where he started noticing that he was more short of breath with exertion. Associated symptoms include some lower leg swelling, orthopnea, intermittent chest pains, increased sensitivity to smell. Denies any palpitations, lightheadedness, rash, fever, chills, diarrhea, or abdominal distention. Family history is significant for his paternal grandfather died from a heart attack in his early 64s,  and his father in his 79s. Unsure any other significant heart history and other family members. He reports smoking 1 pack of cigarettes per day average for last 4 years or so. He declines need of a nicotine patch while hospitalized.  ED Course: Upon admission patient was evaluated and seen to be in A. fib with heart rates into the 140s, and all other vital signs within normal limits. Lab work revealed elevated WBC of 13.8, potassium 4, magnesium 2, troponin 0.07. EKG showed atrial fibrillation with Q waves. Patient was placed on a diltiazem drip and given Lovenox. TRH called to have patient admitted to stepdown unit. Patient notes shortness of breath symptoms improved  Review of Systems: As per HPI otherwise 10 point review of systems negative.   Past Medical  History  Diagnosis Date  . GERD (gastroesophageal reflux disease)   . Tobacco abuse     History reviewed. No pertinent past surgical history.   reports that he has been smoking Cigarettes.  He has been smoking about 1.00 pack per day. He does not have any smokeless tobacco history on file. He reports that he does not drink alcohol or use illicit drugs.  No Known Allergies  History reviewed. No pertinent family history.  Prior to Admission medications   Medication Sig Start Date End Date Taking? Authorizing Provider  acetaminophen (TYLENOL) 500 MG tablet Take 500 mg by mouth every 6 (six) hours as needed for mild pain.   Yes Historical Provider, MD  aspirin EC 81 MG tablet Take 324 mg by mouth once.   Yes Historical Provider, MD  pantoprazole (PROTONIX) 40 MG tablet Take 1 tablet (40 mg total) by mouth daily. 11/30/15  Yes Mikey Kirschner, MD  HYDROcodone-acetaminophen (NORCO/VICODIN) 5-325 MG tablet Take 1 tablet by mouth every 6 (six) hours as needed for moderate pain. Patient not taking: Reported on 12/13/2015 12/01/15   Mikey Kirschner, MD  mupirocin ointment (BACTROBAN) 2 % PLACE 1 APPLICATION INTO THE NOSE 2 TIMES DAILY. Patient not taking: Reported on 12/13/2015 07/22/14   Mikey Kirschner, MD  ondansetron (ZOFRAN ODT) 4 MG disintegrating tablet Take 1 tablet (4 mg total) by mouth every 8 (eight) hours as needed for nausea or vomiting. Patient not taking: Reported on 12/13/2015 11/30/15   Mikey Kirschner, MD  sucralfate (CARAFATE) 1 g tablet One tablet ac and hs-mix in 2 oz water Patient not  taking: Reported on 12/13/2015 11/30/15   Mikey Kirschner, MD    Physical Exam:    Constitutional: NAD, calm, comfortable Filed Vitals:   12/13/15 1659 12/13/15 1700 12/13/15 1730 12/13/15 1800  BP: 128/93 136/118 124/72 116/78  Pulse: 69 116 91 72  Temp:      TempSrc:      Resp: 25 15 14 20   Height:      Weight:      SpO2: 96% 96% 96% 99%   Eyes: PERRL, lids and conjunctivae  normal ENMT: Mucous membranes are moist. Posterior pharynx clear of any exudate or lesions.Normal dentition.  Neck: normal, supple, no masses, no thyromegaly Respiratory: Bilateral crackles appreciated Normal respiratory effort. No accessory muscle use.  Cardiovascular: Regular rate and rhythm, no murmurs / rubs / gallops. Trace bilateral lower extremity edema. 2+ pedal pulses. No carotid bruits.  Abdomen: no tenderness, no masses palpated. No hepatosplenomegaly. Bowel sounds positive.  Musculoskeletal: no clubbing / cyanosis. No joint deformity upper and lower extremities. Good ROM, no contractures. Normal muscle tone.  Skin: no rashes, lesions, ulcers. No induration Neurologic: CN 2-12 grossly intact. Sensation intact, DTR normal. Strength 5/5 in all 4.  Psychiatric: Normal judgment and insight. Alert and oriented x 3. Normal mood.     Labs on Admission: I have personally reviewed following labs and imaging studies  CBC:  Recent Labs Lab 12/13/15 1645  WBC 13.8*  HGB 13.5  HCT 39.1  MCV 91.1  PLT 99991111   Basic Metabolic Panel:  Recent Labs Lab 12/13/15 1645  NA 135  K 4.0  CL 102  CO2 23  GLUCOSE 113*  BUN 13  CREATININE 1.03  CALCIUM 8.7*  MG 2.0   GFR: Estimated Creatinine Clearance: 103.1 mL/min (by C-G formula based on Cr of 1.03). Liver Function Tests: No results for input(s): AST, ALT, ALKPHOS, BILITOT, PROT, ALBUMIN in the last 168 hours. No results for input(s): LIPASE, AMYLASE in the last 168 hours. No results for input(s): AMMONIA in the last 168 hours. Coagulation Profile:  Recent Labs Lab 12/13/15 1645  INR 1.12   Cardiac Enzymes:  Recent Labs Lab 12/13/15 1645  TROPONINI 0.07*   BNP (last 3 results) No results for input(s): PROBNP in the last 8760 hours. HbA1C:  Recent Labs  12/13/15 1525  HGBA1C 4.8   CBG: No results for input(s): GLUCAP in the last 168 hours. Lipid Profile: No results for input(s): CHOL, HDL, LDLCALC, TRIG,  CHOLHDL, LDLDIRECT in the last 72 hours. Thyroid Function Tests: No results for input(s): TSH, T4TOTAL, FREET4, T3FREE, THYROIDAB in the last 72 hours. Anemia Panel: No results for input(s): VITAMINB12, FOLATE, FERRITIN, TIBC, IRON, RETICCTPCT in the last 72 hours. Urine analysis: No results found for: COLORURINE, APPEARANCEUR, LABSPEC, PHURINE, GLUCOSEU, HGBUR, BILIRUBINUR, KETONESUR, PROTEINUR, UROBILINOGEN, NITRITE, LEUKOCYTESUR Sepsis Labs: No results found for this or any previous visit (from the past 240 hour(s)).   Radiological Exams on Admission: Dg Chest Port 1 View  12/13/2015  CLINICAL DATA:  Shortness of Breath and atrial fibrillation, history of tobacco use EXAM: PORTABLE CHEST 1 VIEW COMPARISON:  None. FINDINGS: Cardiac shadow is mildly enlarged. Mild vascular congestion is noted without focal infiltrate. No pulmonary edema is seen. No bony abnormality is noted. IMPRESSION: Mild vascular congestion. Electronically Signed   By: Inez Catalina M.D.   On: 12/13/2015 17:05    EKG: Independently reviewed. Atrial fibrillation with a rapid ventricular rate of 147. There are signs of Q waves present in multiple leads.  Assessment/Plan  Atrial fibrillation with RVR: Acute. Patient found in atrial fibrillation with a rapid ventricular rate of 147 on admission. Started on a diltiazem drip with improvement in heart rates. - Admit to stepdown unit - Continue diltiazem drip - Check TSH - Check an EKG in am - Lovenox per pharmacy - Consult ordered for cardiology to see in a.m.  New-onset CHF: BNP was elevated at 625 to suggest acute decompensated heart failure. On physical exam patient while with trace edema and bilateral crackles heard on lung exam. Chest x-ray showing mild vascular congestion. - Strict I&Os and daily weights - Lasix 20 mg IV 1 dose, reassess need for further diuresis in a.m. - Check echocardiogram  Elevated troponin: On admission patient's initial troponin was 0.07.  EKG showed no significant ST wave elevations. Suspected this is secondary to acute supply and demand. - Trend cardiac troponins   Leukocytosis: Acute. WBC elevated at 13.8. Suspected that this could be reactive - check UA in am - Follow-up repeat CBC in a.m.  Tobacco abuse - Counseled the patient on the need of cessation of tobacco use  DVT prophylaxis: Lovenox   Code Status:  Full  Family Communication:Discussed plan with the patient's family including wife, son, and daughter present at bedside  Disposition Plan: home w Consults called: None Admission status: Stepdown observation  Norval Morton MD [facility] Pager [phone]  If 7PM-7AM, please contact night-coverage [email or web address] Password [facility]  12/13/2015, 6:54 PM

## 2015-12-13 NOTE — ED Notes (Signed)
Pt sent from Dr. Lance Sell office for abnormal EKG.  Pt has been having issues for a while with chest pain and shortness of breath.  Pt c/o epigastric pain today,.

## 2015-12-14 ENCOUNTER — Observation Stay (HOSPITAL_BASED_OUTPATIENT_CLINIC_OR_DEPARTMENT_OTHER): Payer: 59

## 2015-12-14 ENCOUNTER — Encounter (HOSPITAL_COMMUNITY): Payer: Self-pay | Admitting: Adult Health

## 2015-12-14 DIAGNOSIS — I10 Essential (primary) hypertension: Secondary | ICD-10-CM

## 2015-12-14 DIAGNOSIS — I4891 Unspecified atrial fibrillation: Secondary | ICD-10-CM

## 2015-12-14 DIAGNOSIS — I340 Nonrheumatic mitral (valve) insufficiency: Secondary | ICD-10-CM

## 2015-12-14 DIAGNOSIS — Z720 Tobacco use: Secondary | ICD-10-CM | POA: Diagnosis not present

## 2015-12-14 DIAGNOSIS — I429 Cardiomyopathy, unspecified: Secondary | ICD-10-CM

## 2015-12-14 DIAGNOSIS — I248 Other forms of acute ischemic heart disease: Secondary | ICD-10-CM

## 2015-12-14 DIAGNOSIS — I255 Ischemic cardiomyopathy: Secondary | ICD-10-CM

## 2015-12-14 HISTORY — DX: Nonrheumatic mitral (valve) insufficiency: I34.0

## 2015-12-14 HISTORY — DX: Ischemic cardiomyopathy: I25.5

## 2015-12-14 LAB — CBC WITH DIFFERENTIAL/PLATELET
BASOS PCT: 1 %
Basophils Absolute: 0.1 10*3/uL (ref 0.0–0.1)
EOS ABS: 0.3 10*3/uL (ref 0.0–0.7)
EOS PCT: 3 %
HCT: 36.7 % — ABNORMAL LOW (ref 39.0–52.0)
Hemoglobin: 12.5 g/dL — ABNORMAL LOW (ref 13.0–17.0)
LYMPHS ABS: 2.7 10*3/uL (ref 0.7–4.0)
Lymphocytes Relative: 34 %
MCH: 31.3 pg (ref 26.0–34.0)
MCHC: 34.1 g/dL (ref 30.0–36.0)
MCV: 91.8 fL (ref 78.0–100.0)
Monocytes Absolute: 0.8 10*3/uL (ref 0.1–1.0)
Monocytes Relative: 10 %
Neutro Abs: 4 10*3/uL (ref 1.7–7.7)
Neutrophils Relative %: 52 %
PLATELETS: 305 10*3/uL (ref 150–400)
RBC: 4 MIL/uL — AB (ref 4.22–5.81)
RDW: 13.2 % (ref 11.5–15.5)
WBC: 7.9 10*3/uL (ref 4.0–10.5)

## 2015-12-14 LAB — ECHOCARDIOGRAM COMPLETE
CHL CUP MV DEC (S): 162
CHL CUP TV REG PEAK VELOCITY: 302 cm/s
E/e' ratio: 12.24
EWDT: 162 ms
FS: 11 % — AB (ref 28–44)
Height: 72 in
IVS/LV PW RATIO, ED: 1.25
LASIZE: 49 mm
LAVOL: 79.4 mL
LAVOLA4C: 66.2 mL
LEFT ATRIUM END SYS DIAM: 49 mm
LV PW d: 11.3 mm — AB (ref 0.6–1.1)
LVEEAVG: 12.24
LVEEMED: 12.24
LVELAT: 7.73 cm/s
LVOT area: 3.14 cm2
LVOT diameter: 20 mm
MVPG: 4 mmHg
MVPKAVEL: 34.4 m/s
MVPKEVEL: 94.6 m/s
TAPSE: 15.8 mm
TDI e' lateral: 7.73
TDI e' medial: 4.65
TRMAXVEL: 302 cm/s
VTI: 123 cm
Weight: 3615.54 oz

## 2015-12-14 LAB — URINALYSIS, ROUTINE W REFLEX MICROSCOPIC
BILIRUBIN URINE: NEGATIVE
GLUCOSE, UA: NEGATIVE mg/dL
Hgb urine dipstick: NEGATIVE
KETONES UR: NEGATIVE mg/dL
Leukocytes, UA: NEGATIVE
NITRITE: NEGATIVE
PH: 6 (ref 5.0–8.0)
Protein, ur: NEGATIVE mg/dL
Specific Gravity, Urine: <1.005 — ABNORMAL LOW (ref 1.005–1.030)

## 2015-12-14 LAB — BASIC METABOLIC PANEL
Anion gap: 8 (ref 5–15)
BUN: 14 mg/dL (ref 6–20)
CHLORIDE: 106 mmol/L (ref 101–111)
CO2: 23 mmol/L (ref 22–32)
Calcium: 8.4 mg/dL — ABNORMAL LOW (ref 8.9–10.3)
Creatinine, Ser: 0.91 mg/dL (ref 0.61–1.24)
GFR calc Af Amer: >60 mL/min (ref >60)
GFR calc non Af Amer: >60 mL/min (ref >60)
Glucose, Bld: 125 mg/dL — ABNORMAL HIGH (ref 65–99)
POTASSIUM: 3.6 mmol/L (ref 3.5–5.1)
SODIUM: 137 mmol/L (ref 135–145)

## 2015-12-14 LAB — PROTIME-INR
INR: 1.24 (ref 0.00–1.49)
PROTHROMBIN TIME: 15.7 s — AB (ref 11.6–15.2)

## 2015-12-14 LAB — TROPONIN I
TROPONIN I: 0.61 ng/mL — AB (ref <0.031)
Troponin I: 0.54 ng/mL (ref <0.031)
Troponin I: 0.33 ng/mL — ABNORMAL HIGH (ref <0.031)

## 2015-12-14 LAB — MRSA PCR SCREENING: MRSA by PCR: NEGATIVE

## 2015-12-14 MED ORDER — SODIUM CHLORIDE 0.9 % IV SOLN
INTRAVENOUS | Status: DC
  Administered 2015-12-15: 06:00:00 via INTRAVENOUS

## 2015-12-14 MED ORDER — SODIUM CHLORIDE 0.9 % IV SOLN: 250.0000 mL | INTRAVENOUS | Status: DC | PRN

## 2015-12-14 MED ORDER — CARVEDILOL 6.25 MG PO TABS
6.2500 mg | ORAL_TABLET | Freq: Two times a day (BID) | ORAL | Status: DC
  Administered 2015-12-14 – 2015-12-17 (×5): 6.25 mg via ORAL
  Filled 2015-12-14 (×5): qty 1

## 2015-12-14 MED ORDER — SODIUM CHLORIDE 0.9% FLUSH: 3.0000 mL | Freq: Two times a day (BID) | INTRAVENOUS | Status: DC

## 2015-12-14 MED ORDER — DILTIAZEM HCL ER COATED BEADS 120 MG PO CP24
120.0000 mg | ORAL_CAPSULE | Freq: Every day | ORAL | Status: DC
  Administered 2015-12-14: 120 mg via ORAL
  Filled 2015-12-14: qty 1

## 2015-12-14 MED ORDER — PERFLUTREN LIPID MICROSPHERE
1.0000 mL | INTRAVENOUS | Status: DC | PRN
  Administered 2015-12-14: 1 mL via INTRAVENOUS
  Filled 2015-12-14: qty 10

## 2015-12-14 MED ORDER — ASPIRIN 81 MG PO CHEW
81.0000 mg | CHEWABLE_TABLET | ORAL | Status: AC
  Administered 2015-12-15: 81 mg via ORAL
  Filled 2015-12-14: qty 1

## 2015-12-14 MED ORDER — ATORVASTATIN CALCIUM 20 MG PO TABS: 20.0000 mg | ORAL_TABLET | Freq: Every day | ORAL | Status: DC

## 2015-12-14 MED ORDER — ATORVASTATIN CALCIUM 80 MG PO TABS
80.0000 mg | ORAL_TABLET | Freq: Every day | ORAL | Status: DC
  Administered 2015-12-14 – 2015-12-21 (×6): 80 mg via ORAL
  Filled 2015-12-14 (×7): qty 1

## 2015-12-14 MED ORDER — HEPARIN (PORCINE) IN NACL 100-0.45 UNIT/ML-% IJ SOLN
1550.0000 [IU]/h | INTRAMUSCULAR | Status: DC
  Administered 2015-12-14: 1550 [IU]/h via INTRAVENOUS
  Filled 2015-12-14 (×2): qty 250

## 2015-12-14 MED ORDER — ZOLPIDEM TARTRATE 5 MG PO TABS
5.0000 mg | ORAL_TABLET | Freq: Every evening | ORAL | Status: DC | PRN
  Administered 2015-12-14 – 2015-12-15 (×2): 5 mg via ORAL
  Filled 2015-12-14 (×2): qty 1

## 2015-12-14 MED ORDER — SODIUM CHLORIDE 0.9% FLUSH: 3.0000 mL | INTRAVENOUS | Status: DC | PRN

## 2015-12-14 NOTE — Progress Notes (Signed)
*  PRELIMINARY RESULTS* Echocardiogram 2D Echocardiogram has been performed.  [name] [name] 12/14/2015, 9:58 AM

## 2015-12-14 NOTE — Progress Notes (Signed)
Patient asked to walk. Walked out to window on 2a tolerated well. Heartrate remained under 100. No pain or discomforts.

## 2015-12-14 NOTE — Progress Notes (Signed)
Patient loaded to [employer] and acquired all paperwork given. Tried to call [facility] to give report and they will call me back

## 2015-12-14 NOTE — Progress Notes (Signed)
PROGRESS NOTE  [name] [name] [id number] DOB: 02-Jan-1961 DOA: 12/13/2015 PCP: Mickie Hillier, MD  Brief Narrative: 30 yom with a hx of tobacco abuse presented from his PCP office after being found to have an elevated heart rate and abnormal EKG. Upon admission, patient was noted to be in a-fib with rates in the 140's. He was placed on a diltiazem drip and given Lovenox. He was admitted to stepdown unit with some improvement in symptoms. ECHO was performed 6/12 with results pending. Patient will be evaluated by cardiology this am.   Assessment/Plan: 1. Afib with RVR new dx, duration unknown. Converted to sinus rhythm on diltiazem drip. CHA2DS2-VASc 0. TSH wnl. EKG shows SR, inferior and anterior MI age unknown. Pt has a family history of heart disease, and his father died at 61 after several heart attacks and stroke.  2. Demand ischemia secondary to rapid rate. Troponins peaked 0.61. Currently trending down. 3. CHF vs pulmonary congestion secondary to rapid rate. Initial BNP 625. CXR showed mild vascular congestion.  ECHO results pending.  4. Tobacco use disorder.    Much improved. Plan echocardiogram today. Follow cardiology recommendations. Aspirin on discharge.    change to oral diltiazem pending LVEF.  Likely home later today  DVT prophylaxis: Lovenox Code Status: Full Family Communication: Discussed with patient and wife at bedside Disposition Plan: Discharge home once improved  Murray Hodgkins, MD  [facility] Direct contact: [phone] --[place]  --[email or web address]; password [email or web address]  7PM-7AM contact night coverage as above 12/14/2015, 5:17 AM    Consultants:  Cardiology  Procedures:  ECHO 6/12 results pending  Antimicrobials:  none  HPI/Subjective: Doing well. Feels great today relative to past few days. Denies pain, trouble breathing, nausea, or cough. Denies any significant swelling in LE. Patient was sitting up at bedside and appears well.    Objective: Filed Vitals:   12/14/15 0100 12/14/15 0200 12/14/15 0300 12/14/15 0400  BP: 95/69 102/71 103/62 100/71  Pulse: 65 65 72 71  Temp:      TempSrc:      Resp: 12 18 25 17   Height:      Weight:      SpO2: 90% 94% 92% 92%    Intake/Output Summary (Last 24 hours) at 12/14/15 0517 Last data filed at 12/14/15 0400  Gross per 24 hour  Intake   50.5 ml  Output    900 ml  Net -849.5 ml     Filed Weights   12/13/15 1622 12/13/15 2035  Weight: 103.42 kg (228 lb) 102.5 kg (225 lb 15.5 oz)    Exam:    Constitutional:  . Appears calm and comfortable Respiratory:  . CTA bilaterally, no w/r/r.  . Respiratory effort normal. No retractions or accessory muscle use Cardiovascular:  . RRR, no m/r/g . Trace pedal edema bilaterally  . Telemetry SR Psychiatric:  . judgement and insight appear normal . Mental status o Mood, affect appropriate  I have personally reviewed following labs and imaging studies:  Troponin Peaked, now down to 0.54  TSH wnl  Blood sugar wnl  UA negative  MRSA by PCR negative  Scheduled Meds: . aspirin EC  325 mg Oral Daily  . enoxaparin (LOVENOX) injection  100 mg Subcutaneous Q12H  . lisinopril  2.5 mg Oral Daily  . pantoprazole  40 mg Oral Daily  . sodium chloride flush  3 mL Intravenous Q12H   Continuous Infusions: . diltiazem (CARDIZEM) infusion 5 mg/hr (12/14/15 0400)    Principal Problem:  Atrial fibrillation with RVR (HCC) Active Problems:   CHF (congestive heart failure) (HCC)   Tobacco abuse   Leukocytosis     Time spent 25 minutes  By signing my name below, I, Delene Ruffini, attest that this documentation has been prepared under the direction and in the presence of Kaijah Abts P. Sarajane Jews, MD. Electronically Signed: Delene Ruffini, Scribe.  12/14/2015 8:33am     I personally performed the services described in this documentation. All medical record entries made by the scribe were at my direction. I have  reviewed the chart and agree that the record reflects my personal performance and is accurate and complete. Murray Hodgkins, MD

## 2015-12-14 NOTE — Consult Note (Signed)
CARDIOLOGY CONSULT NOTE   Patient ID: [name] [name] MRN: [id number] DOB/AGE: 1961/05/24 55 y.o.  Admit Date: 12/13/2015 Referring Physician: THR-Goodrich MD Primary Physician: Mickie Hillier, MD Consulting Cardiologist: Rozann Lesches MD  Reason for Consultation: Atrial fibrillation, abnormal troponin I, cardiomyopathy  Clinical Summary [name] [name] is a 55 y.o.male who works in hospital maintenance at [employer], history of GERD, ongoing tobacco abuse, seen in Dr. Malachy Moan office on 12/13/2015 for wellness visit follow up after being started on PPI for abdominal pain and GERD symptoms. During examination, he was noted to have irregular rapid HR. EKG revealed atrial fibrillation. He was given 4 baby ASA and sent to ED.   He states he has noticed that he has been having more trouble breathing over the last week and has had to stop while walking across the parking lot while at work. He has feel a fullness beneath his breast bone for about a week. He is unaware of rapid irregular heart rate.   In ED BP was found to be 153/101, HR 85, O2 Sat 100%, afebrile. Troponin 0.07; 0.36, WBC 13.8. BNP 625. CXR with mild vascular congestion. EKG revealed atrial fibrillation HR of 75 bpm with anterior lateral Q waves. He was treated with diltiazem bolus and started on diltiazem gtt. He converted to NSR by the time he reached ICU. Echocardiogram has been completed   He is feeling better since return to NSR with no further complaints of chest pain with improvement in breathing. He is being transitioned to PO diltiazem Troponin 0.57, 0.61; 0.54 respectively. Repeat  EKG this am demonstrated NSR with inferior lateral Q waves.   No Known Allergies  Medications Scheduled Medications: . aspirin EC  325 mg Oral Daily  . atorvastatin  80 mg Oral q1800  . carvedilol  6.25 mg Oral BID WC  . enoxaparin (LOVENOX) injection  100 mg Subcutaneous Q12H  . lisinopril  2.5 mg Oral Daily  . pantoprazole  40 mg Oral Daily  .  sodium chloride flush  3 mL Intravenous Q12H    PRN Medications: sodium chloride, acetaminophen, ondansetron (ZOFRAN) IV, perflutren lipid microspheres (DEFINITY) IV suspension, sodium chloride flush   Past Medical History  Diagnosis Date  . GERD (gastroesophageal reflux disease)   . Tobacco abuse     History reviewed. No pertinent past surgical history.  Family History  Problem Relation Age of Onset  . Heart attack Paternal Grandfather   . Heart failure Father   . Atrial fibrillation Father     Social History [name] [name] reports that he has been smoking Cigarettes.  He has been smoking about 1.00 pack per day. He does not have any smokeless tobacco history on file. [name] [name] reports that he does not drink alcohol.  Review of Systems Complete review of systems are found to be negative unless outlined in H&P above. No bleeding problems.  Physical Examination Blood pressure 123/84, pulse 79, temperature 97.3 F (36.3 C), temperature source Oral, resp. rate 25, height 6' (1.829 m), weight 225 lb 15.5 oz (102.5 kg), SpO2 100 %.  Intake/Output Summary (Last 24 hours) at 12/14/15 1144 Last data filed at 12/14/15 0700  Gross per 24 hour  Intake   65.5 ml  Output   1500 ml  Net -1434.5 ml    Telemetry: NSR rates in the 80's   GEN: No acute distress  HEENT: Conjunctiva and lids normal, oropharynx clear. Neck: Supple, no elevated JVP, possible soft left carotid bruit, no thyromegaly. Lungs: Clear to  auscultation, nonlabored breathing at rest. Cardiac: Regular rate and rhythm, no S3 or significant systolic murmur, no pericardial rub. Abdomen: Soft, nontender, bowel sounds present, no guarding or rebound. Extremities: No pitting edema, distal pulses 2+. Skin: Warm and dry. Musculoskeletal: No kyphosis. Neuropsychiatric: Alert and oriented x3, affect grossly appropriate.  Prior Cardiac Testing/Procedures [place]  Lab Results  Basic Metabolic Panel:  Recent Labs Lab  12/13/15 1645 12/14/15 0440  NA 135 137  K 4.0 3.6  CL 102 106  CO2 23 23  GLUCOSE 113* 125*  BUN 13 14  CREATININE 1.03 0.91  CALCIUM 8.7* 8.4*  MG 2.0  --     CBC:  Recent Labs Lab 12/13/15 1645 12/14/15 0440  WBC 13.8* 7.9  NEUTROABS  --  4.0  HGB 13.5 12.5*  HCT 39.1 36.7*  MCV 91.1 91.8  PLT 365 305    Cardiac Enzymes:  Recent Labs Lab 12/13/15 1645 12/13/15 2012 12/13/15 2251 12/14/15 0158 12/14/15 0440  TROPONINI 0.07* 0.36* 0.57* 0.61* 0.54*    BNP: 625  Radiology: Dg Chest Port 1 View  12/13/2015  CLINICAL DATA:  Shortness of Breath and atrial fibrillation, history of tobacco use EXAM: PORTABLE CHEST 1 VIEW COMPARISON:  None. FINDINGS: Cardiac shadow is mildly enlarged. Mild vascular congestion is noted without focal infiltrate. No pulmonary edema is seen. No bony abnormality is noted. IMPRESSION: Mild vascular congestion. Electronically Signed   By: Inez Catalina M.D.   On: 12/13/2015 17:05   ECG: Currently, NSR with inferior/lateral Q waves.   Echocardiogram 12/14/2015: Study Conclusions  - Left ventricle: The cavity size was normal. Wall thickness was  increased in a pattern of mild LVH. Systolic function was  severely reduced. The estimated ejection fraction was in the  range of 20% to 25%. Diffuse hypokinesis. There is akinesis of  the basalinferior myocardium. There is severe hypokinesis of the  mid-apicalanteroseptal myocardium. Doppler parameters are  consistent with restrictive physiology, indicative of decreased  left ventricular diastolic compliance and/or increased left  atrial pressure. - Aortic valve: Mildly calcified annulus. Trileaflet; mildly  calcified leaflets. - Mitral valve: There was mild to moderate regurgitation. - Left atrium: The atrium was severely dilated. - Right ventricle: Systolic function was moderately reduced. - Right atrium: The atrium was at the upper limits of normal in  size. Central venous  pressure (est): 15 mm Hg. - Tricuspid valve: There was trivial regurgitation. - Pulmonary arteries: PA peak pressure: 51 mm Hg (S). - Pericardium, extracardiac: There was no pericardial effusion.  Impressions:  - Mild LVH with LVEF 20-25%. There is diffuse hypokinesis with  basal inferior akinesis and severe hypokinesis of the mid to  apical anteroseptal walls. Acoustic shadowing noted at apex,  Definity contrast shows slow swirling flow in the LV apex  insistent with low output but no definitive mural thrombus.  Restrictive diastolic filling pattern with increased LV filling  pressures. Severe left atrial enlargement. Mild to moderate  mitral regurgitation. Mildly calcified aortic annulus. Moderately  reduced RV contraction. Trivial tricuspid regurgitation with PASP  estimated 51 mmHg and elevated CVP.  Impression and Recommendations  1. Paroxysmal Atrial fibrillation: Newly diagnosed. He has now converted to NSR with diltiazem. Continue ASA for now. CHADSVASC Score of 2-3 so anticoagulation will need to be clarified after ischemic evaluation.  2. Elevated troponin I: Demand ischemia most likely with low level elevation, however with cardiomyopathy and WMAs ischemic heart disease is suspected. Plan is transfer to [facility] in anticipation of left and  right heart catheterization. Starting heparin.  3. Newly documented cardiomyopathy: LVEF 20-25% with wall motion abnormalities suggestive of ischemic etiology, no definite mural thrombus with Definity contrast. He has biventricular failure with reduced RV contraction as well.  4. Ongoing tobacco abuse: 1/2 pack a day for over 40 years. Recommend cessation.   5. Hypercholesterolemia: TC 171, LDL 121, HDL 27. Will start low dose statin. Atorvastatin 20 mg daily.    Signed: Phill Myron. Lawrence NP [place]  12/14/2015, 11:44 AM Co-Sign MD   Attending note:  Patient seen and examined. Reviewed records and discussed the  case with Ms. [employer], modified above note. 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 [name]. He reports having right sided abdominal discomfort approximately 2 weeks ago, thought to be having reflux symptoms at that time and placed on PPI. Indicates that those symptoms improved, however one week ago he began to experience an epigastric discomfort that was associated with shortness of breath, orthopnea, and exertional dyspnea while walking across the parking lot. He was noted to have an irregular, rapid heart rate on office visit yesterday with Dr. [name] and referred for further evaluation in the ER. Diagnosed with atrial fibrillation, spontaneously converted to sinus rhythm with diltiazem. Troponin I levels increased up to 0.61. ECG suggests old inferior and possibly anterior infarct pattern, and echocardiogram shows LVEF 20-25% with wall motion abnormalities as outlined, moderately reduced RV contraction. No definite mural thrombus with Definity contrast.  On examination this morning he appears comfortable, sitting in bed side chair, no chest pain or palpitations. Lungs are clear. Cardiac exam reveals indistinct PMI with RRR and no S3. Chemistries reveal no pitting edema. Lab work shows creatinine 0.91, hemoglobin 0.5, platelets 305. Chest x-ray reveals mild vascular congestion.  Recent diagnosis of transient atrial fibrillation, CHADSVASC score is in the 2-3 range with workup pending. He has spontaneously converted to sinus rhythm with diltiazem. More concerning is diagnosis of cardiomyopathy of uncertain duration with severe left ventricular dysfunction, moderate right ventricular dysfunction. ECG and wall motion abnormalities suggest ischemic etiology although troponin I elevations are fairly low level and could be consistent with demand ischemia. Most recent exertional symptoms present over the last week. Discussed with patient and family members in the room. Plan  will be to transfer to [facility] in anticipation of a left and right heart catheterization to help guide further potential options for revascularization as well as medical therapy. Anticoagulation will be ultimately need to be discussed in light of thrombolic risk score, however this may be impacted by antiplatelet regimen if in fact stent placement is needed. Smoking cessation will be important. Starting heparin and empiric statin. No longer on diltiazem, will start Coreg. He is on lisinopril at baseline.  Satira Sark, M.D., F.A.C.C.

## 2015-12-14 NOTE — Progress Notes (Addendum)
ANTICOAGULATION CONSULT NOTE   Pharmacy Consult for Heparin Indication: ACS  No Known Allergies  Patient Measurements: Height: 6' (182.9 cm) Weight: 225 lb 15.5 oz (102.5 kg) IBW/kg (Calculated) : 77.6 HEPARIN DW (KG): 98.6   Vital Signs: Temp: 97.3 F (36.3 C) (06/13 0400) Temp Source: Oral (06/13 0400) BP: 121/83 mmHg (06/13 0800) Pulse Rate: 75 (06/13 0800)  Labs:  Recent Labs  12/13/15 1645  12/13/15 2251 12/14/15 0158 12/14/15 0440  HGB 13.5  --   --   --  12.5*  HCT 39.1  --   --   --  36.7*  PLT 365  --   --   --  305  LABPROT 14.6  --   --   --  15.7*  INR 1.12  --   --   --  1.24  CREATININE 1.03  --   --   --  0.91  TROPONINI 0.07*  < > 0.57* 0.61* 0.54*  < > = values in this interval not displayed. Estimated Creatinine Clearance: 116.3 mL/min (by C-G formula based on Cr of 0.91).  Medical History: Past Medical History  Diagnosis Date  . GERD (gastroesophageal reflux disease)   . Tobacco abuse    Medications:  Prescriptions prior to admission  Medication Sig Dispense Refill Last Dose  . acetaminophen (TYLENOL) 500 MG tablet Take 500 mg by mouth every 6 (six) hours as needed for mild pain.   Past Week at Unknown time  . aspirin EC 81 MG tablet Take 324 mg by mouth once.   12/13/2015 at Unknown time  . pantoprazole (PROTONIX) 40 MG tablet Take 1 tablet (40 mg total) by mouth daily. 30 tablet 0 Past Week at Unknown time  . HYDROcodone-acetaminophen (NORCO/VICODIN) 5-325 MG tablet Take 1 tablet by mouth every 6 (six) hours as needed for moderate pain. (Patient not taking: Reported on 12/13/2015) 24 tablet 0 Taking  . mupirocin ointment (BACTROBAN) 2 % PLACE 1 APPLICATION INTO THE NOSE 2 TIMES DAILY. (Patient not taking: Reported on 12/13/2015) 22 g 5 Not Taking  . ondansetron (ZOFRAN ODT) 4 MG disintegrating tablet Take 1 tablet (4 mg total) by mouth every 8 (eight) hours as needed for nausea or vomiting. (Patient not taking: Reported on 12/13/2015) 15 tablet 0  Not Taking  . 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 Pharmacy consulted to restart heparin 8 hours after sheath removal (removed at ~ 5pm and TR band placed). The patient was previously on heparin at 1550 units/hr with heparin level= 0.67  Goal of Therapy:  Heparin level 0.3-0.7 units/ml Monitor platelets by anticoagulation protocol: Yes   Plan:  -Restart heparin at 1450 units/hr at 1am on 6/15 -Heparin level in 6 hours and daily wth CBC daily  Hildred Laser, Pharm D 12/15/2015 5:33 PM

## 2015-12-15 ENCOUNTER — Encounter: Payer: Self-pay | Admitting: Family Medicine

## 2015-12-15 ENCOUNTER — Encounter (HOSPITAL_COMMUNITY)
Admission: EM | Disposition: A | Discharge status: Home Health | Payer: Self-pay | Source: Home / Self Care | Attending: Thoracic Surgery (Cardiothoracic Vascular Surgery)

## 2015-12-15 DIAGNOSIS — I2510 Atherosclerotic heart disease of native coronary artery without angina pectoris: Secondary | ICD-10-CM

## 2015-12-15 DIAGNOSIS — R7989 Other specified abnormal findings of blood chemistry: Secondary | ICD-10-CM

## 2015-12-15 DIAGNOSIS — I214 Non-ST elevation (NSTEMI) myocardial infarction: Principal | ICD-10-CM

## 2015-12-15 DIAGNOSIS — E669 Obesity, unspecified: Secondary | ICD-10-CM

## 2015-12-15 DIAGNOSIS — I25110 Atherosclerotic heart disease of native coronary artery with unstable angina pectoris: Secondary | ICD-10-CM

## 2015-12-15 DIAGNOSIS — I255 Ischemic cardiomyopathy: Secondary | ICD-10-CM

## 2015-12-15 DIAGNOSIS — R778 Other specified abnormalities of plasma proteins: Secondary | ICD-10-CM | POA: Diagnosis present

## 2015-12-15 DIAGNOSIS — E7800 Pure hypercholesterolemia, unspecified: Secondary | ICD-10-CM

## 2015-12-15 HISTORY — PX: CARDIAC CATHETERIZATION: SHX172

## 2015-12-15 HISTORY — DX: Atherosclerotic heart disease of native coronary artery with unstable angina pectoris: I25.110

## 2015-12-15 HISTORY — DX: Pure hypercholesterolemia, unspecified: E78.00

## 2015-12-15 LAB — CBC
HCT: 35.4 % — ABNORMAL LOW (ref 39.0–52.0)
HEMOGLOBIN: 11.5 g/dL — AB (ref 13.0–17.0)
MCH: 29.7 pg (ref 26.0–34.0)
MCHC: 32.5 g/dL (ref 30.0–36.0)
MCV: 91.5 fL (ref 78.0–100.0)
PLATELETS: 263 10*3/uL (ref 150–400)
RBC: 3.87 MIL/uL — AB (ref 4.22–5.81)
RDW: 13.3 % (ref 11.5–15.5)
WBC: 7.4 10*3/uL (ref 4.0–10.5)

## 2015-12-15 LAB — POCT I-STAT 3, ART BLOOD GAS (G3+)
Bicarbonate: 24 mEq/L (ref 20.0–24.0)
O2 Saturation: 97 %
TCO2: 25 mmol/L (ref 0–100)
pCO2 arterial: 36.2 mmHg (ref 35.0–45.0)
pH, Arterial: 7.429 (ref 7.350–7.450)
pO2, Arterial: 89 mmHg (ref 80.0–100.0)

## 2015-12-15 LAB — POCT I-STAT 3, VENOUS BLOOD GAS (G3P V)
Bicarbonate: 24.7 mEq/L — ABNORMAL HIGH (ref 20.0–24.0)
O2 Saturation: 65 %
TCO2: 26 mmol/L (ref 0–100)
pCO2, Ven: 39.3 mmHg — ABNORMAL LOW (ref 45.0–50.0)
pH, Ven: 7.406 — ABNORMAL HIGH (ref 7.250–7.300)
pO2, Ven: 33 mmHg (ref 31.0–45.0)

## 2015-12-15 LAB — PROTIME-INR
INR: 1.19 (ref 0.00–1.49)
PROTHROMBIN TIME: 15.3 s — AB (ref 11.6–15.2)

## 2015-12-15 LAB — TROPONIN I: TROPONIN I: 0.22 ng/mL — AB (ref <0.031)

## 2015-12-15 LAB — HEPARIN LEVEL (UNFRACTIONATED): HEPARIN UNFRACTIONATED: 0.67 [IU]/mL (ref 0.30–0.70)

## 2015-12-15 SURGERY — RIGHT/LEFT HEART CATH AND CORONARY ANGIOGRAPHY

## 2015-12-15 MED ORDER — MOVING RIGHT ALONG BOOK
Freq: Once | Status: AC
  Administered 2015-12-15: 22:00:00
  Filled 2015-12-15: qty 1

## 2015-12-15 MED ORDER — LIDOCAINE HCL (PF) 1 % IJ SOLN
INTRAMUSCULAR | Status: DC | PRN
  Administered 2015-12-15: 5 mL
  Administered 2015-12-15: 2 mL

## 2015-12-15 MED ORDER — OFF THE BEAT BOOK
Freq: Once | Status: DC
  Filled 2015-12-15: qty 1

## 2015-12-15 MED ORDER — HEPARIN (PORCINE) IN NACL 2-0.9 UNIT/ML-% IJ SOLN
INTRAMUSCULAR | Status: DC | PRN
  Administered 2015-12-15: 1000 mL

## 2015-12-15 MED ORDER — LIVING BETTER WITH HEART FAILURE BOOK: Freq: Once | Status: DC

## 2015-12-15 MED ORDER — HEPARIN SODIUM (PORCINE) 1000 UNIT/ML IJ SOLN
INTRAMUSCULAR | Status: AC
  Filled 2015-12-15: qty 1

## 2015-12-15 MED ORDER — FUROSEMIDE 10 MG/ML IJ SOLN
20.0000 mg | Freq: Two times a day (BID) | INTRAMUSCULAR | Status: DC
  Administered 2015-12-15 – 2015-12-16 (×3): 20 mg via INTRAVENOUS
  Filled 2015-12-15 (×4): qty 2

## 2015-12-15 MED ORDER — ASPIRIN EC 81 MG PO TBEC
81.0000 mg | DELAYED_RELEASE_TABLET | Freq: Every day | ORAL | Status: DC
  Administered 2015-12-16: 81 mg via ORAL
  Filled 2015-12-15: qty 1

## 2015-12-15 MED ORDER — IOPAMIDOL (ISOVUE-370) INJECTION 76%
INTRAVENOUS | Status: AC
  Filled 2015-12-15: qty 100

## 2015-12-15 MED ORDER — HEPARIN SODIUM (PORCINE) 1000 UNIT/ML IJ SOLN
INTRAMUSCULAR | Status: DC | PRN
  Administered 2015-12-15: 5000 [IU] via INTRAVENOUS

## 2015-12-15 MED ORDER — VERAPAMIL HCL 2.5 MG/ML IV SOLN
INTRAVENOUS | Status: AC
  Filled 2015-12-15: qty 2

## 2015-12-15 MED ORDER — SODIUM CHLORIDE 0.9 % IV SOLN: 250.0000 mL | INTRAVENOUS | Status: DC | PRN

## 2015-12-15 MED ORDER — HEPARIN (PORCINE) IN NACL 100-0.45 UNIT/ML-% IJ SOLN
1450.0000 [IU]/h | INTRAMUSCULAR | Status: AC
  Administered 2015-12-16: 1450 [IU]/h via INTRAVENOUS
  Filled 2015-12-15 (×2): qty 250

## 2015-12-15 MED ORDER — VERAPAMIL HCL 2.5 MG/ML IV SOLN
INTRAVENOUS | Status: DC | PRN
  Administered 2015-12-15: 10 mL via INTRA_ARTERIAL

## 2015-12-15 MED ORDER — LIDOCAINE HCL (PF) 1 % IJ SOLN
INTRAMUSCULAR | Status: AC
  Filled 2015-12-15: qty 30

## 2015-12-15 MED ORDER — FENTANYL CITRATE (PF) 100 MCG/2ML IJ SOLN
INTRAMUSCULAR | Status: DC | PRN
  Administered 2015-12-15: 25 ug via INTRAVENOUS

## 2015-12-15 MED ORDER — SODIUM CHLORIDE 0.9% FLUSH
3.0000 mL | INTRAVENOUS | Status: DC | PRN
Start: 2015-12-15 — End: 2015-12-17

## 2015-12-15 MED ORDER — MIDAZOLAM HCL 2 MG/2ML IJ SOLN
INTRAMUSCULAR | Status: DC | PRN
  Administered 2015-12-15: 1 mg via INTRAVENOUS

## 2015-12-15 MED ORDER — MIDAZOLAM HCL 2 MG/2ML IJ SOLN
INTRAMUSCULAR | Status: AC
  Filled 2015-12-15: qty 2

## 2015-12-15 MED ORDER — HEPARIN (PORCINE) IN NACL 2-0.9 UNIT/ML-% IJ SOLN
INTRAMUSCULAR | Status: AC
  Filled 2015-12-15: qty 1000

## 2015-12-15 MED ORDER — FENTANYL CITRATE (PF) 100 MCG/2ML IJ SOLN
INTRAMUSCULAR | Status: AC
  Filled 2015-12-15: qty 2

## 2015-12-15 MED ORDER — SODIUM CHLORIDE 0.9% FLUSH: 3.0000 mL | Freq: Two times a day (BID) | INTRAVENOUS | Status: DC

## 2015-12-15 SURGICAL SUPPLY — 10 items
CATH BALLN WEDGE 5F 110CM (CATHETERS) ×2 IMPLANT
CATH INFINITI JR4 5F (CATHETERS) ×2 IMPLANT
CATH OPTITORQUE JACKY 4.0 5F (CATHETERS) ×2 IMPLANT
DEVICE RAD COMP TR BAND LRG (VASCULAR PRODUCTS) ×2 IMPLANT
GLIDESHEATH SLEND SS 6F .021 (SHEATH) ×4 IMPLANT
KIT HEART LEFT (KITS) ×2 IMPLANT
PACK CARDIAC CATHETERIZATION (CUSTOM PROCEDURE TRAY) ×2 IMPLANT
TRANSDUCER W/STOPCOCK (MISCELLANEOUS) ×2 IMPLANT
TUBING CIL FLEX 10 FLL-RA (TUBING) ×2 IMPLANT
WIRE SAFE-T 1.5MM-J .035X260CM (WIRE) ×2 IMPLANT

## 2015-12-15 NOTE — Interval H&P Note (Signed)
Cath Lab Visit (complete for each Cath Lab visit)  Clinical Evaluation Leading to the Procedure:   ACS: Yes.    Non-ACS:    Anginal Classification: CCS IV  Anti-ischemic medical therapy: Minimal Therapy (1 class of medications)  Non-Invasive Test Results: No non-invasive testing performed  Prior CABG: No previous CABG      History and Physical Interval Note:  12/15/2015 4:02 PM  [name] [name]  has presented today for surgery, with the diagnosis of cp  The various methods of treatment have been discussed with the patient and family. After consideration of risks, benefits and other options for treatment, the patient has consented to  Procedure(s): Right/Left Heart Cath and Coronary Angiography (N/A) as a surgical intervention .  The patient's history has been reviewed, patient examined, no change in status, stable for surgery.  I have reviewed the patient's chart and labs.  Questions were answered to the patient's satisfaction.     Kathlyn Sacramento

## 2015-12-15 NOTE — Progress Notes (Signed)
Patient: [name] [name] / Admit Date: 12/13/2015 / Date of Encounter: 12/15/2015, 9:12 AM  Subjective: Walked the halls without any further SOB. No chest pain. He says he can lay flat for cath today.  Objective: Telemetry: NSR occ PACs, rare blocked PACs Physical Exam: Blood pressure 130/81, pulse 88, temperature 98.2 F (36.8 C), temperature source Oral, resp. rate 20, height 6' (1.829 m), weight 224 lb 3.3 oz (101.7 kg), SpO2 96 %. General: Well developed, well nourished WM, in no acute distress. Head: Normocephalic, atraumatic, sclera non-icteric, no xanthomas, nares are without discharge. Neck: Negative for carotid bruits. JVP not elevated. Lungs: Soft crackles at bases, otherwise no wheezes or rhonchi. Breathing is unlabored. Heart: RRR S1 S2 without murmurs, rubs, or gallops.  Abdomen: Soft, non-tender, non-distended with normoactive bowel sounds. No rebound/guarding. Extremities: No clubbing or cyanosis. No edema. Distal pedal pulses are 2+ and equal bilaterally. Neuro: Alert and oriented X 3. Moves all extremities spontaneously. Psych:  Responds to questions appropriately with a normal affect.  Intake/Output Summary (Last 24 hours) at 12/15/15 0912 Last data filed at 12/14/15 2014  Gross per 24 hour  Intake 391.26 ml  Output   1100 ml  Net -708.74 ml   Inpatient Medications:  . [START ON 12/16/2015] aspirin EC  81 mg Oral Daily  . atorvastatin  80 mg Oral q1800  . carvedilol  6.25 mg Oral BID WC  . lisinopril  2.5 mg Oral Daily  . off the beat book   Does not apply Once  . pantoprazole  40 mg Oral Daily  . sodium chloride flush  3 mL Intravenous Q12H  . sodium chloride flush  3 mL Intravenous Q12H   Infusions:  . sodium chloride 10 mL/hr at 12/15/15 0618  . heparin 1,550 Units/hr (12/14/15 1813)   Labs:  Recent Labs  12/13/15 1645 12/14/15 0440  NA 135 137  K 4.0 3.6  CL 102 106  CO2 23 23  GLUCOSE 113* 125*  BUN 13 14  CREATININE 1.03 0.91  CALCIUM 8.7*  8.4*  MG 2.0  --    No results for input(s): AST, ALT, ALKPHOS, BILITOT, PROT, ALBUMIN in the last 72 hours.  Recent Labs  12/14/15 0440 12/15/15 0537  WBC 7.9 7.4  NEUTROABS 4.0  --   HGB 12.5* 11.5*  HCT 36.7* 35.4*  MCV 91.8 91.5  PLT 305 263    Recent Labs  12/14/15 0158 12/14/15 0440 12/14/15 1138 12/15/15 0537  TROPONINI 0.61* 0.54* 0.33* 0.22*   Invalid input(s): POCBNP  Recent Labs  12/13/15 1525  HGBA1C 4.8    Radiology/Studies:  US Abdomen Complete  12/01/2015  CLINICAL DATA:  Seven-eight weeks of right upper quadrant pain with nausea and vomiting; history of gastroesophageal reflux. EXAM: ABDOMEN ULTRASOUND COMPLETE COMPARISON:  None FINDINGS: Gallbladder: No gallstones or wall thickening visualized. No sonographic Murphy sign noted by sonographer. Common bile duct: Diameter: 2 mm Liver: No focal lesion identified. Within normal limits in parenchymal echogenicity. IVC: No abnormality visualized. Pancreas: Visualization of the pancreas was limited due to bowel gas. Spleen: Size and appearance within normal limits. Right Kidney: Length: 11.9 cm. Echogenicity within normal limits. No mass or hydronephrosis visualized. Left Kidney: Length: 11.3 cm. Echogenicity within normal limits. No mass or hydronephrosis visualized. Abdominal aorta: No aneurysm visualized. Other findings: There is no ascites. IMPRESSION: 1. No acute hepatobiliary abnormality is observed. Visualization of the pancreas was limited however. If gallbladder dysfunction is suspected clinically, a nuclear medicine hepatobiliary scan may  be useful. 2. No acute abnormality is observed within the abdomen. Electronically Signed   By: David  Martinique M.D.   On: 12/01/2015 09:55   Dg Chest Port 1 View  12/13/2015  CLINICAL DATA:  Shortness of Breath and atrial fibrillation, history of tobacco use EXAM: PORTABLE CHEST 1 VIEW COMPARISON:  None. FINDINGS: Cardiac shadow is mildly enlarged. Mild vascular congestion is  noted without focal infiltrate. No pulmonary edema is seen. No bony abnormality is noted. IMPRESSION: Mild vascular congestion. Electronically Signed   By: Inez Catalina M.D.   On: 12/13/2015 17:05   TTE: 12/14/2015 - Left ventricle: The cavity size was normal. Wall thickness was  increased in a pattern of mild LVH. Systolic function was  severely reduced. The estimated ejection fraction was in the  range of 20% to 25%. Diffuse hypokinesis. There is akinesis of  the basalinferior myocardium. There is severe hypokinesis of the  mid-apicalanteroseptal myocardium. Doppler parameters are  consistent with restrictive physiology, indicative of decreased  left ventricular diastolic compliance and/or increased left  atrial pressure. - Aortic valve: Mildly calcified annulus. Trileaflet; mildly  calcified leaflets. - Mitral valve: There was mild to moderate regurgitation. - Left atrium: The atrium was severely dilated. - Right ventricle: Systolic function was moderately reduced. - Right atrium: The atrium was at the upper limits of normal in  size. Central venous pressure (est): 15 mm Hg. - Tricuspid valve: There was trivial regurgitation. - Pulmonary arteries: PA peak pressure: 51 mm Hg (S). - Pericardium, extracardiac: There was no pericardial effusion.  Impressions:  - Mild LVH with LVEF 20-25%. There is diffuse hypokinesis with  basal inferior akinesis and severe hypokinesis of the mid to  apical anteroseptal walls. Acoustic shadowing noted at apex,  Definity contrast shows slow swirling flow in the LV apex  insistent with low output but no definitive mural thrombus.  Restrictive diastolic filling pattern with increased LV filling  pressures. Severe left atrial enlargement. Mild to moderate  mitral regurgitation. Mildly calcified aortic annulus. Moderately  reduced RV contraction. Trivial tricuspid regurgitation with PASP  estimated 51 mmHg and elevated CVP.      Assessment and Plan   60M with obesity, GERD & ongoing tobacco abuse found to be in atrial fib at PCP's office, sent to [facility] where he was subsequently txf to [facility]. He was found to be hypertensive with elevated troponin, leukocytosis, hyperlipidemia (LDL 121), and newly documented cardiomyopathy with biventricular failure, EF 20-25%. Converted to NSR after being treated with IV diltiazem. TSH wnl. A1C 4.8.   1. Paroxysmal atrial fibrillation, newly identified - currently on heparin and BB. Not yet transitioned to [facility] due to need for cath today.  2. Elevated troponin I (peak 0.61) - possible NSTEMI - need to exclude CAD given new cardiomyopathy. Plan R/LHC today. Risks and benefits of cardiac catheterization have been discussed with the patient. These include bleeding, infection, kidney damage, stroke, heart attack, death.  The patient understands these risks and is willing to proceed. Continue heparin. Decrease ASA to 81mg  daily. Continue BB and statin. D/c q6hr troponins.  3. Newly documented cardiomyopathy: plan cath today to r/o CAD. Continue newly started BB, ACEI. F/u BMET in AM. Discussed new CHF education.  4. Ongoing tobacco abuse: cessation advised.  5. Hypercholesterolemia: now on new statin. Will need recheck LFTs/lipids in 6 weeks.   Signed, Melina Copa PA-C Pager: [phone]  The patient was seen, examined and discussed with Melina Copa, PA-C and I agree with the above.  55 year old gentleman with no prior medical history of heart disease, who presented with typical CHF symptoms, and was found to have LVEF 25-30% with regional wall motion abnormalities,grade 3 diastolic dysfunction, severely dilated left atrium, moderately decreased RV function.He is in atrial fibrillation that is currently rate controlled. He was also found to be in non-STEMI with elevated troponin peak 0.6 was currently on IV heparin. He is awaiting left and right cardiac cath today. On physical exam his fluid overloaded  so I will give him Lasix IV 20 mg twice a day and possibly increasing tomorrow if creatinine normal.we will continue aspirin, atorvastatin, lisinopril, and further increased carvedilol to 12.5 twice a day.  Ena Dawley 12/15/2015

## 2015-12-15 NOTE — Plan of Care (Signed)
Problem: Phase II Progression Outcomes Goal: Cath/PCI Day Path if indicated Outcome: Progressing Orders placed for heart cath awaiting the client to be place into the schedule plan is to go today.

## 2015-12-15 NOTE — H&P (View-Only) (Signed)
Patient: [name] [name] / Admit Date: 12/13/2015 / Date of Encounter: 12/15/2015, 9:12 AM  Subjective: Walked the halls without any further SOB. No chest pain. He says he can lay flat for cath today.  Objective: Telemetry: NSR occ PACs, rare blocked PACs Physical Exam: Blood pressure 130/81, pulse 88, temperature 98.2 F (36.8 C), temperature source Oral, resp. rate 20, height 6' (1.829 m), weight 224 lb 3.3 oz (101.7 kg), SpO2 96 %. General: Well developed, well nourished WM, in no acute distress. Head: Normocephalic, atraumatic, sclera non-icteric, no xanthomas, nares are without discharge. Neck: Negative for carotid bruits. JVP not elevated. Lungs: Soft crackles at bases, otherwise no wheezes or rhonchi. Breathing is unlabored. Heart: RRR S1 S2 without murmurs, rubs, or gallops.  Abdomen: Soft, non-tender, non-distended with normoactive bowel sounds. No rebound/guarding. Extremities: No clubbing or cyanosis. No edema. Distal pedal pulses are 2+ and equal bilaterally. Neuro: Alert and oriented X 3. Moves all extremities spontaneously. Psych:  Responds to questions appropriately with a normal affect.  Intake/Output Summary (Last 24 hours) at 12/15/15 0912 Last data filed at 12/14/15 2014  Gross per 24 hour  Intake 391.26 ml  Output   1100 ml  Net -708.74 ml   Inpatient Medications:  . [START ON 12/16/2015] aspirin EC  81 mg Oral Daily  . atorvastatin  80 mg Oral q1800  . carvedilol  6.25 mg Oral BID WC  . lisinopril  2.5 mg Oral Daily  . off the beat book   Does not apply Once  . pantoprazole  40 mg Oral Daily  . sodium chloride flush  3 mL Intravenous Q12H  . sodium chloride flush  3 mL Intravenous Q12H   Infusions:  . sodium chloride 10 mL/hr at 12/15/15 0618  . heparin 1,550 Units/hr (12/14/15 1813)   Labs:  Recent Labs  12/13/15 1645 12/14/15 0440  NA 135 137  K 4.0 3.6  CL 102 106  CO2 23 23  GLUCOSE 113* 125*  BUN 13 14  CREATININE 1.03 0.91  CALCIUM 8.7*  8.4*  MG 2.0  --    No results for input(s): AST, ALT, ALKPHOS, BILITOT, PROT, ALBUMIN in the last 72 hours.  Recent Labs  12/14/15 0440 12/15/15 0537  WBC 7.9 7.4  NEUTROABS 4.0  --   HGB 12.5* 11.5*  HCT 36.7* 35.4*  MCV 91.8 91.5  PLT 305 263    Recent Labs  12/14/15 0158 12/14/15 0440 12/14/15 1138 12/15/15 0537  TROPONINI 0.61* 0.54* 0.33* 0.22*   Invalid input(s): POCBNP  Recent Labs  12/13/15 1525  HGBA1C 4.8    Radiology/Studies:  US Abdomen Complete  12/01/2015  CLINICAL DATA:  Seven-eight weeks of right upper quadrant pain with nausea and vomiting; history of gastroesophageal reflux. EXAM: ABDOMEN ULTRASOUND COMPLETE COMPARISON:  None FINDINGS: Gallbladder: No gallstones or wall thickening visualized. No sonographic Murphy sign noted by sonographer. Common bile duct: Diameter: 2 mm Liver: No focal lesion identified. Within normal limits in parenchymal echogenicity. IVC: No abnormality visualized. Pancreas: Visualization of the pancreas was limited due to bowel gas. Spleen: Size and appearance within normal limits. Right Kidney: Length: 11.9 cm. Echogenicity within normal limits. No mass or hydronephrosis visualized. Left Kidney: Length: 11.3 cm. Echogenicity within normal limits. No mass or hydronephrosis visualized. Abdominal aorta: No aneurysm visualized. Other findings: There is no ascites. IMPRESSION: 1. No acute hepatobiliary abnormality is observed. Visualization of the pancreas was limited however. If gallbladder dysfunction is suspected clinically, a nuclear medicine hepatobiliary scan may  be useful. 2. No acute abnormality is observed within the abdomen. Electronically Signed   By: David  Martinique M.D.   On: 12/01/2015 09:55   Dg Chest Port 1 View  12/13/2015  CLINICAL DATA:  Shortness of Breath and atrial fibrillation, history of tobacco use EXAM: PORTABLE CHEST 1 VIEW COMPARISON:  None. FINDINGS: Cardiac shadow is mildly enlarged. Mild vascular congestion is  noted without focal infiltrate. No pulmonary edema is seen. No bony abnormality is noted. IMPRESSION: Mild vascular congestion. Electronically Signed   By: Inez Catalina M.D.   On: 12/13/2015 17:05   TTE: 12/14/2015 - Left ventricle: The cavity size was normal. Wall thickness was  increased in a pattern of mild LVH. Systolic function was  severely reduced. The estimated ejection fraction was in the  range of 20% to 25%. Diffuse hypokinesis. There is akinesis of  the basalinferior myocardium. There is severe hypokinesis of the  mid-apicalanteroseptal myocardium. Doppler parameters are  consistent with restrictive physiology, indicative of decreased  left ventricular diastolic compliance and/or increased left  atrial pressure. - Aortic valve: Mildly calcified annulus. Trileaflet; mildly  calcified leaflets. - Mitral valve: There was mild to moderate regurgitation. - Left atrium: The atrium was severely dilated. - Right ventricle: Systolic function was moderately reduced. - Right atrium: The atrium was at the upper limits of normal in  size. Central venous pressure (est): 15 mm Hg. - Tricuspid valve: There was trivial regurgitation. - Pulmonary arteries: PA peak pressure: 51 mm Hg (S). - Pericardium, extracardiac: There was no pericardial effusion.  Impressions:  - Mild LVH with LVEF 20-25%. There is diffuse hypokinesis with  basal inferior akinesis and severe hypokinesis of the mid to  apical anteroseptal walls. Acoustic shadowing noted at apex,  Definity contrast shows slow swirling flow in the LV apex  insistent with low output but no definitive mural thrombus.  Restrictive diastolic filling pattern with increased LV filling  pressures. Severe left atrial enlargement. Mild to moderate  mitral regurgitation. Mildly calcified aortic annulus. Moderately  reduced RV contraction. Trivial tricuspid regurgitation with PASP  estimated 51 mmHg and elevated CVP.      Assessment and Plan   55M with obesity, GERD & ongoing tobacco abuse found to be in atrial fib at PCP's office, sent to [facility] where he was subsequently txf to [facility]. He was found to be hypertensive with elevated troponin, leukocytosis, hyperlipidemia (LDL 121), and newly documented cardiomyopathy with biventricular failure, EF 20-25%. Converted to NSR after being treated with IV diltiazem. TSH wnl. A1C 4.8.   1. Paroxysmal atrial fibrillation, newly identified - currently on heparin and BB. Not yet transitioned to [facility] due to need for cath today.  2. Elevated troponin I (peak 0.61) - possible NSTEMI - need to exclude CAD given new cardiomyopathy. Plan R/LHC today. Risks and benefits of cardiac catheterization have been discussed with the patient. These include bleeding, infection, kidney damage, stroke, heart attack, death.  The patient understands these risks and is willing to proceed. Continue heparin. Decrease ASA to 81mg  daily. Continue BB and statin. D/c q6hr troponins.  3. Newly documented cardiomyopathy: plan cath today to r/o CAD. Continue newly started BB, ACEI. F/u BMET in AM. Discussed new CHF education.  4. Ongoing tobacco abuse: cessation advised.  5. Hypercholesterolemia: now on new statin. Will need recheck LFTs/lipids in 6 weeks.   Signed, Melina Copa PA-C Pager: [phone]  The patient was seen, examined and discussed with Melina Copa, PA-C and I agree with the above.  55 year old gentleman with no prior medical history of heart disease, who presented with typical CHF symptoms, and was found to have LVEF 25-30% with regional wall motion abnormalities,grade 3 diastolic dysfunction, severely dilated left atrium, moderately decreased RV function.He is in atrial fibrillation that is currently rate controlled. He was also found to be in non-STEMI with elevated troponin peak 0.6 was currently on IV heparin. He is awaiting left and right cardiac cath today. On physical exam his fluid overloaded  so I will give him Lasix IV 20 mg twice a day and possibly increasing tomorrow if creatinine normal.we will continue aspirin, atorvastatin, lisinopril, and further increased carvedilol to 12.5 twice a day.  Ena Dawley 12/15/2015

## 2015-12-15 NOTE — Progress Notes (Addendum)
ANTICOAGULATION CONSULT NOTE   Pharmacy Consult for Heparin Indication: ACS & new afib  No Known Allergies  Patient Measurements: Height: 6' (182.9 cm) Weight: 224 lb 3.3 oz (101.7 kg) IBW/kg (Calculated) : 77.6 HEPARIN DW (KG): 98.7   Vital Signs: Temp: 98.2 F (36.8 C) (06/14 0611) Temp Source: Oral (06/14 0611) BP: 130/81 mmHg (06/14 0611) Pulse Rate: 88 (06/14 0611)  Labs:  Recent Labs  12/13/15 1645  12/14/15 0440 12/14/15 1138 12/15/15 0537 12/15/15 0914  HGB 13.5  --  12.5*  --  11.5*  --   HCT 39.1  --  36.7*  --  35.4*  --   PLT 365  --  305  --  263  --   LABPROT 14.6  --  15.7*  --  15.3*  --   INR 1.12  --  1.24  --  1.19  --   HEPARINUNFRC  --   --   --   --   --  0.67  CREATININE 1.03  --  0.91  --   --   --   TROPONINI 0.07*  < > 0.54* 0.33* 0.22*  --   < > = values in this interval not displayed. Estimated Creatinine Clearance: 115.8 mL/min (by C-G formula based on Cr of 0.91).  Assessment: 55 yo man on IV heparin for ACS and new afib, plan for cath this afternoon. Heparin level 0.67, therapeutic on 1550 units/hr. Noted initially started on lovenox at [facility], last dose of lovenox was 0830 on 6/13.    Goal of Therapy:  Heparin level 0.3-0.7 units/ml Monitor platelets by anticoagulation protocol: Yes   Plan:  Continue heparin gtt at current rate F/u after cath F/u plans for oral anticoagulation  Maryanna Shape, PharmD, BCPS  Clinical Pharmacist  Pager: [phone]   12/15/2015,10:54 AM

## 2015-12-16 ENCOUNTER — Other Ambulatory Visit: Payer: Self-pay | Admitting: *Deleted

## 2015-12-16 ENCOUNTER — Inpatient Hospital Stay (HOSPITAL_COMMUNITY): Payer: 59

## 2015-12-16 ENCOUNTER — Encounter (HOSPITAL_COMMUNITY): Payer: Self-pay | Admitting: Cardiovascular Disease

## 2015-12-16 DIAGNOSIS — I2510 Atherosclerotic heart disease of native coronary artery without angina pectoris: Secondary | ICD-10-CM

## 2015-12-16 DIAGNOSIS — I4891 Unspecified atrial fibrillation: Secondary | ICD-10-CM | POA: Diagnosis not present

## 2015-12-16 DIAGNOSIS — I5021 Acute systolic (congestive) heart failure: Secondary | ICD-10-CM | POA: Diagnosis present

## 2015-12-16 DIAGNOSIS — D72829 Elevated white blood cell count, unspecified: Secondary | ICD-10-CM | POA: Diagnosis present

## 2015-12-16 DIAGNOSIS — I272 Other secondary pulmonary hypertension: Secondary | ICD-10-CM | POA: Diagnosis present

## 2015-12-16 DIAGNOSIS — K219 Gastro-esophageal reflux disease without esophagitis: Secondary | ICD-10-CM | POA: Diagnosis present

## 2015-12-16 DIAGNOSIS — I480 Paroxysmal atrial fibrillation: Secondary | ICD-10-CM | POA: Diagnosis present

## 2015-12-16 DIAGNOSIS — I429 Cardiomyopathy, unspecified: Secondary | ICD-10-CM | POA: Diagnosis not present

## 2015-12-16 DIAGNOSIS — I214 Non-ST elevation (NSTEMI) myocardial infarction: Secondary | ICD-10-CM | POA: Diagnosis not present

## 2015-12-16 DIAGNOSIS — E7800 Pure hypercholesterolemia, unspecified: Secondary | ICD-10-CM | POA: Diagnosis not present

## 2015-12-16 DIAGNOSIS — Z79899 Other long term (current) drug therapy: Secondary | ICD-10-CM | POA: Diagnosis not present

## 2015-12-16 DIAGNOSIS — J90 Pleural effusion, not elsewhere classified: Secondary | ICD-10-CM | POA: Diagnosis not present

## 2015-12-16 DIAGNOSIS — F17210 Nicotine dependence, cigarettes, uncomplicated: Secondary | ICD-10-CM | POA: Diagnosis present

## 2015-12-16 DIAGNOSIS — Z8249 Family history of ischemic heart disease and other diseases of the circulatory system: Secondary | ICD-10-CM | POA: Diagnosis not present

## 2015-12-16 DIAGNOSIS — I255 Ischemic cardiomyopathy: Secondary | ICD-10-CM | POA: Diagnosis present

## 2015-12-16 DIAGNOSIS — D62 Acute posthemorrhagic anemia: Secondary | ICD-10-CM | POA: Diagnosis not present

## 2015-12-16 DIAGNOSIS — R918 Other nonspecific abnormal finding of lung field: Secondary | ICD-10-CM | POA: Diagnosis not present

## 2015-12-16 DIAGNOSIS — I340 Nonrheumatic mitral (valve) insufficiency: Secondary | ICD-10-CM | POA: Diagnosis present

## 2015-12-16 DIAGNOSIS — I110 Hypertensive heart disease with heart failure: Secondary | ICD-10-CM | POA: Diagnosis present

## 2015-12-16 DIAGNOSIS — J811 Chronic pulmonary edema: Secondary | ICD-10-CM | POA: Diagnosis not present

## 2015-12-16 DIAGNOSIS — J9811 Atelectasis: Secondary | ICD-10-CM | POA: Diagnosis not present

## 2015-12-16 DIAGNOSIS — Z7982 Long term (current) use of aspirin: Secondary | ICD-10-CM | POA: Diagnosis not present

## 2015-12-16 DIAGNOSIS — I5023 Acute on chronic systolic (congestive) heart failure: Secondary | ICD-10-CM | POA: Diagnosis present

## 2015-12-16 DIAGNOSIS — I25110 Atherosclerotic heart disease of native coronary artery with unstable angina pectoris: Secondary | ICD-10-CM

## 2015-12-16 DIAGNOSIS — I080 Rheumatic disorders of both mitral and aortic valves: Secondary | ICD-10-CM | POA: Diagnosis not present

## 2015-12-16 DIAGNOSIS — I6522 Occlusion and stenosis of left carotid artery: Secondary | ICD-10-CM | POA: Diagnosis not present

## 2015-12-16 DIAGNOSIS — R112 Nausea with vomiting, unspecified: Secondary | ICD-10-CM | POA: Diagnosis not present

## 2015-12-16 DIAGNOSIS — Z951 Presence of aortocoronary bypass graft: Secondary | ICD-10-CM | POA: Diagnosis not present

## 2015-12-16 LAB — CBC
HCT: 39.1 % (ref 39.0–52.0)
Hemoglobin: 13 g/dL (ref 13.0–17.0)
MCH: 30.5 pg (ref 26.0–34.0)
MCHC: 33.2 g/dL (ref 30.0–36.0)
MCV: 91.8 fL (ref 78.0–100.0)
Platelets: 316 10*3/uL (ref 150–400)
RBC: 4.26 MIL/uL (ref 4.22–5.81)
RDW: 13.3 % (ref 11.5–15.5)
WBC: 9.1 10*3/uL (ref 4.0–10.5)

## 2015-12-16 LAB — COMPREHENSIVE METABOLIC PANEL
ALK PHOS: 108 U/L (ref 38–126)
ALT: 23 U/L (ref 17–63)
AST: 20 U/L (ref 15–41)
Albumin: 3.6 g/dL (ref 3.5–5.0)
Anion gap: 9 (ref 5–15)
BILIRUBIN TOTAL: 1 mg/dL (ref 0.3–1.2)
BUN: 10 mg/dL (ref 6–20)
CALCIUM: 9.3 mg/dL (ref 8.9–10.3)
CO2: 28 mmol/L (ref 22–32)
CREATININE: 1.1 mg/dL (ref 0.61–1.24)
Chloride: 103 mmol/L (ref 101–111)
Glucose, Bld: 99 mg/dL (ref 65–99)
Potassium: 3.7 mmol/L (ref 3.5–5.1)
Sodium: 140 mmol/L (ref 135–145)
Total Protein: 7.2 g/dL (ref 6.5–8.1)

## 2015-12-16 LAB — PROTIME-INR
INR: 1.16 (ref 0.00–1.49)
Prothrombin Time: 15 seconds (ref 11.6–15.2)

## 2015-12-16 LAB — BASIC METABOLIC PANEL
ANION GAP: 11 (ref 5–15)
BUN: 11 mg/dL (ref 6–20)
CHLORIDE: 103 mmol/L (ref 101–111)
CO2: 25 mmol/L (ref 22–32)
Calcium: 9 mg/dL (ref 8.9–10.3)
Creatinine, Ser: 1.1 mg/dL (ref 0.61–1.24)
Glucose, Bld: 110 mg/dL — ABNORMAL HIGH (ref 65–99)
POTASSIUM: 3.9 mmol/L (ref 3.5–5.1)
SODIUM: 139 mmol/L (ref 135–145)

## 2015-12-16 LAB — HEPARIN LEVEL (UNFRACTIONATED)
Heparin Unfractionated: 0.14 IU/mL — ABNORMAL LOW (ref 0.30–0.70)
Heparin Unfractionated: 0.31 IU/mL (ref 0.30–0.70)

## 2015-12-16 LAB — TYPE AND SCREEN
ABO/RH(D): O POS
ANTIBODY SCREEN: NEGATIVE

## 2015-12-16 LAB — PREALBUMIN: Prealbumin: 19.3 mg/dL (ref 18–38)

## 2015-12-16 LAB — ABO/RH: ABO/RH(D): O POS

## 2015-12-16 MED ORDER — TEMAZEPAM 15 MG PO CAPS: 15.0000 mg | ORAL_CAPSULE | Freq: Once | ORAL | Status: DC | PRN

## 2015-12-16 MED ORDER — NITROGLYCERIN IN D5W 200-5 MCG/ML-% IV SOLN
2.0000 ug/min | INTRAVENOUS | Status: DC
  Filled 2015-12-16: qty 250

## 2015-12-16 MED ORDER — SODIUM CHLORIDE 0.9 % IV SOLN
INTRAVENOUS | Status: DC
  Filled 2015-12-16: qty 30

## 2015-12-16 MED ORDER — EPINEPHRINE HCL 1 MG/ML IJ SOLN
0.0000 ug/min | INTRAVENOUS | Status: DC
  Filled 2015-12-16: qty 4

## 2015-12-16 MED ORDER — SODIUM CHLORIDE 0.9 % IV SOLN
INTRAVENOUS | Status: AC
  Administered 2015-12-17: 14 mL/h via INTRAVENOUS
  Filled 2015-12-16: qty 40

## 2015-12-16 MED ORDER — VANCOMYCIN HCL 1000 MG IV SOLR
INTRAVENOUS | Status: AC
  Administered 2015-12-17: 1000 mL
  Filled 2015-12-16: qty 1000

## 2015-12-16 MED ORDER — DOPAMINE-DEXTROSE 3.2-5 MG/ML-% IV SOLN
0.0000 ug/kg/min | INTRAVENOUS | Status: AC
  Administered 2015-12-17: 3 ug/kg/min via INTRAVENOUS
  Filled 2015-12-16 (×2): qty 250

## 2015-12-16 MED ORDER — DEXTROSE 5 % IV SOLN
750.0000 mg | INTRAVENOUS | Status: DC
  Filled 2015-12-16: qty 750

## 2015-12-16 MED ORDER — VANCOMYCIN HCL 10 G IV SOLR
1250.0000 mg | INTRAVENOUS | Status: AC
  Administered 2015-12-17: 1250 mg via INTRAVENOUS
  Filled 2015-12-16: qty 1250

## 2015-12-16 MED ORDER — BISACODYL 5 MG PO TBEC: 5.0000 mg | DELAYED_RELEASE_TABLET | Freq: Once | ORAL | Status: DC

## 2015-12-16 MED ORDER — CHLORHEXIDINE GLUCONATE 4 % EX LIQD
60.0000 mL | Freq: Once | CUTANEOUS | Status: AC
  Administered 2015-12-17: 4 via TOPICAL
  Filled 2015-12-16: qty 60

## 2015-12-16 MED ORDER — CHLORHEXIDINE GLUCONATE 4 % EX LIQD
60.0000 mL | Freq: Once | CUTANEOUS | Status: DC
  Filled 2015-12-16: qty 60

## 2015-12-16 MED ORDER — PLASMA-LYTE 148 IV SOLN
INTRAVENOUS | Status: AC
  Administered 2015-12-17: 500 mL
  Filled 2015-12-16: qty 2.5

## 2015-12-16 MED ORDER — ALPRAZOLAM 0.5 MG PO TABS: 0.5000 mg | ORAL_TABLET | Freq: Two times a day (BID) | ORAL | Status: DC | PRN

## 2015-12-16 MED ORDER — CHLORHEXIDINE GLUCONATE 0.12 % MT SOLN
15.0000 mL | Freq: Once | OROMUCOSAL | Status: DC
Start: 2015-12-17 — End: 2015-12-17

## 2015-12-16 MED ORDER — POTASSIUM CHLORIDE 2 MEQ/ML IV SOLN
80.0000 meq | INTRAVENOUS | Status: DC
  Filled 2015-12-16: qty 40

## 2015-12-16 MED ORDER — METOPROLOL TARTRATE 12.5 MG HALF TABLET: 12.5000 mg | ORAL_TABLET | Freq: Once | ORAL | Status: DC

## 2015-12-16 MED ORDER — MAGNESIUM SULFATE 50 % IJ SOLN
40.0000 meq | INTRAMUSCULAR | Status: DC
  Filled 2015-12-16: qty 10

## 2015-12-16 MED ORDER — DEXTROSE 5 % IV SOLN
30.0000 ug/min | INTRAVENOUS | Status: AC
  Administered 2015-12-17: 40 ug/min via INTRAVENOUS
  Filled 2015-12-16: qty 2

## 2015-12-16 MED ORDER — DEXTROSE 5 % IV SOLN
1.5000 g | INTRAVENOUS | Status: AC
  Administered 2015-12-17: 1.5 g via INTRAVENOUS
  Administered 2015-12-17: .75 g via INTRAVENOUS
  Filled 2015-12-16: qty 1.5

## 2015-12-16 MED ORDER — DEXMEDETOMIDINE HCL IN NACL 400 MCG/100ML IV SOLN
0.1000 ug/kg/h | INTRAVENOUS | Status: AC
Start: 2015-12-17 — End: 2015-12-17
  Administered 2015-12-17: .2 ug/kg/h via INTRAVENOUS
  Filled 2015-12-16: qty 100

## 2015-12-16 MED ORDER — INSULIN REGULAR HUMAN 100 UNIT/ML IJ SOLN
INTRAMUSCULAR | Status: AC
  Administered 2015-12-17: .5 [IU]/h via INTRAVENOUS
  Filled 2015-12-16: qty 2.5

## 2015-12-16 NOTE — Consult Note (Signed)
[address]       [place], [address]             [id number]          CARDIOTHORACIC SURGERY CONSULTATION REPORT  PCP is Mickie Hillier, MD Referring Provider is Ena Dawley, MD Primary Cardiologist is Satira Sark, MD  Reason for consultation:  Severe multi-vessel CAD  HPI:  Patient is a 55 year old male with no previous history of coronary artery disease but risk factors notable for long-standing tobacco abuse and hypercholesterolemia who has been referred for surgical consultation to discuss treatment options for management of severe multivessel coronary artery disease with unstable angina, recent non-ST segment elevation myocardial infarction, new onset paroxysmal atrial fibrillation and acute systolic congestive heart failure.  The patient states that he has been physically active and healthy for all of his life. He admits to long-standing tobacco abuse. Approximately 4-6 weeks ago he began to experience intermittent dull pain in the right flank. Symptoms waxed and waned for a period of time but he ultimately presented to his primary care physician. He had routine lab work and underwent abdominal ultrasound because of suspicion of possible gallbladder disease.  Abdominal ultrasound was reportedly normal. He was treated for presumed GE reflux disease. Approximately 6 days ago the patient was awoke from his sleep with dull midepigastric and substernal chest pain associated with shortness of breath. Symptoms persisted for several hours and then resolved. 2 days later symptoms recurred.  He presented to his primary care physician's office and was noted to be in rapid atrial fibrillation. He was sent directly to the emergency department.  Troponin levels were elevated. He was treated with intravenous diltiazem and converted to sinus rhythm. Symptoms of chest discomfort and shortness of breath improved.  Transthoracic echocardiogram was performed demonstrating severe  left ventricular systolic dysfunction with ejection fraction estimated 20-25%. There was moderate mitral regurgitation and severe left atrial enlargement. There were findings suggestive of moderate to severe pulmonary hypertension. The patient was treated with diuretics and shortness of breath improved. He diuresed more than 8 pounds over 48 hours.  Left and right heart catheterization was performed 12/15/2015. Patient was found to have moderate to severe left ventricular systolic dysfunction with 100% chronic occlusion of the mid left anterior descending coronary artery, 100% ostial occlusion of the right coronary artery, 70% stenosis of left main coronary artery and 70% proximal stenosis of the left circumflex coronary artery. There are left to right collaterals filling the right coronary circulation. There was mild pulmonary hypertension with moderately elevated filling pressures. Cardiothoracic surgical consultation was requested.  The patient is married and lives with his wife in [place].  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 He has been physically active and without other significant limitations up until presently. Prior to his acute illness the patient denies any history of exertional chest pain or shortness of breath. At the time of hospital presentation the patient complained of orthopnea and resting shortness of breath. His breathing has much improved over the past 72 hours. He denies any history of palpitations, dizzy spells, or syncope. He has never had lower extremity edema. He has a long term smoker currently smoking between one half and 1 pack of cigarettes daily.  Past Medical History  Diagnosis Date  . GERD (gastroesophageal reflux disease)   . Tobacco abuse   . Atrial fibrillation with rapid ventricular response (  [place]) 12/13/2015  . Cardiomyopathy ([place]) 12/13/2015  . NSTEMI (non-ST elevated myocardial  infarction) ([place])   . Cardiomyopathy, ischemic 12/14/2015    EF 20-25%  . Mitral regurgitation 12/14/2015    moderate  . Acute systolic congestive heart failure ([place])   . Coronary artery disease involving native coronary artery with unstable angina pectoris ([place]) 12/15/2015  . Hypercholesterolemia 12/15/2015    Past Surgical History  Procedure Laterality Date  . Cardiac catheterization N/A 12/15/2015    Procedure: Right/Left Heart Cath and Coronary Angiography;  Surgeon: Wellington Hampshire, MD;  Location: [facility];  Service: Cardiovascular;  Laterality: N/A;  . Knee arthroscopy Bilateral     Family History  Problem Relation Age of Onset  . Heart attack Paternal Grandfather   . Heart failure Father   . Atrial fibrillation Father     Social History   Social History  . Marital Status: Married    Spouse Name: N/A  . Number of Children: N/A  . Years of Education: N/A   Occupational History  . Maintanence [employer]   Social History Main Topics  . Smoking status: Current Some Day Smoker -- 1.00 packs/day    Types: Cigarettes  . Smokeless tobacco: Not on file  . Alcohol Use: No  . Drug Use: No  . Sexual Activity: Not on file   Other Topics Concern  . Not on file   Social History Narrative    Prior to Admission medications   Medication Sig Start Date End Date Taking? Authorizing Provider  acetaminophen (TYLENOL) 500 MG tablet Take 500 mg by mouth every 6 (six) hours as needed for mild pain.   Yes Historical Provider, MD  aspirin EC 81 MG tablet Take 324 mg by mouth once.   Yes Historical Provider, MD  pantoprazole (PROTONIX) 40 MG tablet Take 1 tablet (40 mg total) by mouth daily. 11/30/15  Yes Mikey Kirschner, MD  HYDROcodone-acetaminophen (NORCO/VICODIN) 5-325 MG tablet Take 1 tablet by mouth every 6 (six) hours as needed for moderate pain. Patient not taking: Reported on 12/13/2015 12/01/15   Mikey Kirschner, MD  mupirocin ointment (BACTROBAN) 2 % PLACE 1  APPLICATION INTO THE NOSE 2 TIMES DAILY. Patient not taking: Reported on 12/13/2015 07/22/14   Mikey Kirschner, MD  ondansetron (ZOFRAN ODT) 4 MG disintegrating tablet Take 1 tablet (4 mg total) by mouth every 8 (eight) hours as needed for nausea or vomiting. Patient not taking: Reported on 12/13/2015 11/30/15   Mikey Kirschner, MD  sucralfate (CARAFATE) 1 g tablet One tablet ac and hs-mix in 2 oz water Patient not taking: Reported on 12/13/2015 11/30/15   Mikey Kirschner, MD    Current Facility-Administered Medications  Medication Dose Route Frequency Provider Last Rate Last Dose  . 0.9 %  sodium chloride infusion  250 mL Intravenous PRN Rondell A Tamala Julian, MD      . 0.9 %  sodium chloride infusion  250 mL Intravenous PRN Wellington Hampshire, MD      . acetaminophen (TYLENOL) tablet 650 mg  650 mg Oral Q4H PRN Norval Morton, MD      . aspirin EC tablet 81 mg  81 mg Oral Daily Dayna N Dunn, PA-C   81 mg at 12/16/15 0834  . atorvastatin (LIPITOR) tablet 80 mg  80 mg Oral q1800 Lendon Colonel, NP   80 mg at 12/16/15 1736  . carvedilol (COREG) tablet 6.25 mg  6.25 mg Oral BID  [place], NP   6.25 mg at 12/16/15 1736  . furosemide (LASIX) injection 20 mg  20 mg Intravenous BID Dorothy Spark, MD   20 mg at 12/16/15 1737  . heparin ADULT infusion 100 units/mL (25000 units/246mL sodium chloride 0.45%)  1,450 Units/hr Intravenous Continuous [name], MD 14.5 mL/hr at 12/16/15 0115 1,450 Units/hr at 12/16/15 0115  . lisinopril (PRINIVIL,ZESTRIL) tablet 2.5 mg  2.5 mg Oral Daily Norval Morton, MD   2.5 mg at 12/16/15 0834  . Living Better with Heart Failure Book   Does not apply Once Dayna N Dunn, PA-C      . off the beat book   Does not apply Once Vern Claude, Therapist, sports      . ondansetron (ZOFRAN) injection 4 mg  4 mg Intravenous Q6H PRN Norval Morton, MD      . pantoprazole (PROTONIX) EC tablet 40 mg  40 mg Oral Daily Norval Morton, MD   40 mg at 12/16/15 0834  . sodium chloride  flush (NS) 0.9 % injection 3 mL  3 mL Intravenous Q12H Rondell Charmayne Sheer, MD   3 mL at 12/15/15 0100  . sodium chloride flush (NS) 0.9 % injection 3 mL  3 mL Intravenous PRN Rondell A Tamala Julian, MD      . sodium chloride flush (NS) 0.9 % injection 3 mL  3 mL Intravenous Q12H Wellington Hampshire, MD   0 mL at 12/15/15 2200  . sodium chloride flush (NS) 0.9 % injection 3 mL  3 mL Intravenous PRN Wellington Hampshire, MD      . zolpidem (AMBIEN) tablet 5 mg  5 mg Oral QHS PRN Jeryl Columbia, NP   5 mg at 12/15/15 2152    No Known Allergies    Review of Systems:   General:  normal appetite, decreased energy, no weight gain, no weight loss, no fever  Cardiac:  + chest pain with exertion, + chest pain at rest, +SOB with exertion, + resting SOB, + PND, + orthopnea, no palpitations, + arrhythmia, + atrial fibrillation, no LE edema, no dizzy spells, no syncope  Respiratory:  + shortness of breath, no home oxygen, + productive cough, + dry cough, no bronchitis, no wheezing, no hemoptysis, no asthma, no pain with inspiration or cough, no sleep apnea, no CPAP at night  GI:   no difficulty swallowing, no reflux, no frequent heartburn, no hiatal hernia, no abdominal pain, no constipation, no diarrhea, no hematochezia, no hematemesis, no melena  GU:   no dysuria,  no frequency, no urinary tract infection, no hematuria, no enlarged prostate, no kidney stones, no kidney disease  Vascular:  no pain suggestive of claudication, no pain in feet, no leg cramps, no varicose veins, no DVT, no non-healing foot ulcer  Neuro:   no stroke, no TIA's, no seizures, no headaches, no temporary blindness one eye,  no slurred speech, no peripheral neuropathy, no chronic pain, no instability of gait, no memory/cognitive dysfunction  Musculoskeletal: no arthritis, no joint swelling, no myalgias, no difficulty walking, normal mobility   Skin:   no rash, no itching, no skin infections, no pressure sores or ulcerations  Psych:   no anxiety,  no depression, no nervousness, no unusual recent stress  Eyes:   no blurry vision, no floaters, no recent vision changes, does not wear glasses or contacts  ENT:   no hearing loss, no loose or painful teeth, no dentures  Hematologic:  no easy  bruising, no abnormal bleeding, no clotting disorder, no frequent epistaxis  Endocrine:  no diabetes, does not check CBG's at home     Physical Exam:   BP 124/78 mmHg  Pulse 89  Temp(Src) 97.6 F (36.4 C) (Oral)  Resp 18  Ht 6' (1.829 m)  Wt 218 lb 1.6 oz (98.93 kg)  BMI 29.57 kg/m2  SpO2 99%  General:    well-appearing  HEENT:  Unremarkable   Neck:   no JVD, no bruits, no adenopathy   Chest:   clear to auscultation, symmetrical breath sounds, no wheezes, no rhonchi   CV:   RRR, no murmur   Abdomen:  soft, non-tender, no masses   Extremities:  warm, well-perfused, pulses palpable, no lower extremity edema  Rectal/GU  Deferred  Neuro:   Grossly non-focal and symmetrical throughout  Skin:   Clean and dry, no rashes, no breakdown  Diagnostic Tests:  Transthoracic Echocardiography  Patient: [name] [name], [name] [name] MR #: [id number] Study Date: 12/14/2015 Gender: M Age: 70 Height: Weight: BSA: Pt. Status: Room: IC03  ATTENDING Samuella Cota SONOGRAPHER Leavy Cella PERFORMING [facility] ADMITTING Avis, Rondell A ORDERING Smith, Rondell A REFERRING Smith, Rondell A  cc:  ------------------------------------------------------------------- LV EF: 20% - 25%  ------------------------------------------------------------------- Indications: Atrial fibrillation - 427.31.  ------------------------------------------------------------------- History: PMH: Leukocytosis. Atrial fibrillation. Congestive heart failure. Risk factors: Current tobacco use.  ------------------------------------------------------------------- Study Conclusions  - Left ventricle:  The cavity size was normal. Wall thickness was  increased in a pattern of mild LVH. Systolic function was  severely reduced. The estimated ejection fraction was in the  range of 20% to 25%. Diffuse hypokinesis. There is akinesis of  the basalinferior myocardium. There is severe hypokinesis of the  mid-apicalanteroseptal myocardium. Doppler parameters are  consistent with restrictive physiology, indicative of decreased  left ventricular diastolic compliance and/or increased left  atrial pressure. - Aortic valve: Mildly calcified annulus. Trileaflet; mildly  calcified leaflets. - Mitral valve: There was mild to moderate regurgitation. - Left atrium: The atrium was severely dilated. - Right ventricle: Systolic function was moderately reduced. - Right atrium: The atrium was at the upper limits of normal in  size. Central venous pressure (est): 15 mm Hg. - Tricuspid valve: There was trivial regurgitation. - Pulmonary arteries: PA peak pressure: 51 mm Hg (S). - Pericardium, extracardiac: There was no pericardial effusion.  Impressions:  - Mild LVH with LVEF 20-25%. There is diffuse hypokinesis with  basal inferior akinesis and severe hypokinesis of the mid to  apical anteroseptal walls. Acoustic shadowing noted at apex,  Definity contrast shows slow swirling flow in the LV apex  insistent with low output but no definitive mural thrombus.  Restrictive diastolic filling pattern with increased LV filling  pressures. Severe left atrial enlargement. Mild to moderate  mitral regurgitation. Mildly calcified aortic annulus. Moderately  reduced RV contraction. Trivial tricuspid regurgitation with PASP  estimated 51 mmHg and elevated CVP.  Transthoracic echocardiography. M-mode, complete 2D, spectral Doppler, and color Doppler. Birthdate: Patient birthdate: 12/22/1960. Age: Patient is 55 yr old. Sex: Gender: male. Blood pressure: 121/83 Patient status:  Inpatient. Study date: Study date: 12/14/2015. Study time: 09:14 AM. Location: Bedside.  -------------------------------------------------------------------  ------------------------------------------------------------------- Left ventricle: The cavity size was normal. Wall thickness was increased in a pattern of mild LVH. Systolic function was severely reduced. The estimated ejection fraction was in the range of 20% to 25%. Diffuse hypokinesis. Regional wall motion abnormalities: There is akinesis of the basalinferior myocardium. There is severe hypokinesis of the  mid-apicalanteroseptal myocardium. Doppler parameters are consistent with restrictive physiology, indicative of decreased left ventricular diastolic compliance and/or increased left atrial pressure.  ------------------------------------------------------------------- Aortic valve: Mildly calcified annulus. Trileaflet; mildly calcified leaflets. Doppler: There was no significant regurgitation.  ------------------------------------------------------------------- Aorta: Aortic root: The aortic root was normal in size.  ------------------------------------------------------------------- Mitral valve: The valve appears to be grossly normal. Doppler: There was mild to moderate regurgitation. Peak gradient (D): 4 mm Hg.  ------------------------------------------------------------------- Left atrium: The atrium was severely dilated.  ------------------------------------------------------------------- Right ventricle: The cavity size was normal. Systolic function was moderately reduced.  ------------------------------------------------------------------- Pulmonic valve: The valve appears to be grossly normal. Doppler: There was physiologic regurgitation.  ------------------------------------------------------------------- Tricuspid valve: The valve appears to be grossly  normal. Doppler: There was trivial regurgitation.  ------------------------------------------------------------------- Right atrium: The atrium was at the upper limits of normal in size.  ------------------------------------------------------------------- Pericardium: There was no pericardial effusion.  ------------------------------------------------------------------- Systemic veins: Inferior vena cava: The vessel was dilated. The respirophasic diameter changes were blunted (< 50%), consistent with elevated central venous pressure.  ------------------------------------------------------------------- Measurements  Left ventricle Value Reference LV ID, ED, PLAX chordal 48 mm 43 - 52 LV ID, ES, PLAX chordal (H) 42.8 mm 23 - 38 LV fx shortening, PLAX chordal (L) 11 % >=29 LV PW thickness, ED 11.3 mm --------- IVS/LV PW ratio, ED 1.25 <=1.3 LV e&', lateral 7.73 cm/s --------- LV E/e&', lateral 12.24 --------- LV e&', medial 4.65 cm/s --------- LV E/e&', medial 20.34 --------- LV e&', average 6.19 cm/s --------- LV E/e&', average 15.28 ---------  Ventricular septum Value Reference IVS thickness, ED 14.1 mm ---------  LVOT Value Reference LVOT ID, S 20 mm --------- LVOT area 3.14 cm^2 ---------  Aorta Value Reference Aortic root ID, ED 27  mm ---------  Left atrium Value Reference LA ID, A-P, ES 49 mm --------- LA volume, S 79.4 ml --------- LA volume, ES, 1-p A4C 66.2 ml --------- LA volume, ES, 1-p A2C 95.3 ml ---------  Mitral valve Value Reference Mitral E-wave peak velocity 94.6 cm/s --------- Mitral A-wave peak velocity 34.4 cm/s --------- Mitral deceleration time 162 ms 150 - 230 Mitral peak gradient, D 4 mm Hg --------- Mitral E/A ratio, peak 2.8 --------- Mitral maximal regurg velocity, PISA 429 cm/s --------- Mitral regurg VTI, PISA 123 cm ---------  Pulmonary arteries Value Reference PA pressure, S, DP (H) 51 mm Hg <=30  Tricuspid valve Value Reference Tricuspid regurg peak velocity 302 cm/s --------- Tricuspid peak RV-RA gradient 36 mm Hg ---------  Systemic veins Value Reference Estimated CVP 15 mm Hg ---------  Right ventricle Value Reference TAPSE 15.8 mm --------- RV pressure, S, DP (H) 51 mm Hg <=30  Legend: (L) and (H) mark values outside specified reference range.  ------------------------------------------------------------------- Prepared and Electronically Authenticated by  Rozann Lesches, M.D. 2017-06-13T11:16:10    CARDIAC CATHETERIZATION Procedures    Right/Left Heart Cath and Coronary  Angiography    Conclusion     There is moderate to severe left ventricular systolic dysfunction.  Ost LM to LM lesion, 70% stenosed.  Ost RCA lesion, 100% stenosed.  Mid LAD lesion, 100% stenosed.  Prox LAD to Mid LAD lesion, 90% stenosed.  Ost Cx to Prox Cx lesion, 70% stenosed.  Mid Cx lesion, 80% stenosed.  1. Significant left main and severe three-vessel coronary artery disease with ostial occlusion of the right coronary artery and occlusion of the mid LAD. 2. Moderately to severely reduced LV systolic function with an ejection fraction of 30-35%. 3. Right heart catheterization showed mild pulmonary hypertension,  moderately elevated filling pressures and normal cardiac output.  Recommendations: CABG. I left a message to cardiothoracic surgery. Please note that there was severe pressure dampening every time I engaged the left main coronary artery with PVCs on the monitor. Thus, I only took 3 shots. The right coronary artery appears to be flush occluded at the ostium and could not be engaged. Collaterals are seen from the left side all the way back to the proximal RCA. The LAD is occluded in the midsegment with only faint collaterals and might not be suitable for grafting. However, there are good targets in the diagonal, left circumflex and right coronary artery.     Indications    NSTEMI (non-ST elevated myocardial infarction) ([place]) [I21.4 (ICD-10-CM)]   Cardiomyopathy, ischemic [I25.5 (ICD-10-CM)]    Technique and Indications    Procedural Details: The pre-existing IV in the left antecubital vein was exchanged under sterile fashion to a slender sheath. Right heart catheterization was performed using a 5 French Swan-Ganz catheter. Cardiac output was calculated by the Fick method. The right wrist was prepped, draped, and anesthetized with 1% lidocaine. Using the modified Seldinger technique, a 5 French sheath was introduced into the right radial artery. 3 mg of verapamil  was administered through the sheath, weight-based unfractionated heparin was administered intravenously. A Jackie catheter was used for selective coronary angiography and left ventriculography.  A JR4 diagnostic catheter was used but even with this the right coronary artery could not be engaged. It appears that the vessel is occluded at the ostium. Catheter exchanges were performed over an exchange length guidewire. There were no immediate procedural complications. A TR band was used for radial hemostasis at the completion of the procedure. The patient was transferred to the post catheterization recovery area for further monitoring.  During this procedure the patient is administered a total of Versed 1 mg and Fentanyl 25 mcg to achieve and maintain moderate conscious sedation. The patient's heart rate, blood pressure, and oxygen saturation are monitored continuously during the procedure. The period of conscious sedation is 37 minutes, of which I was present face-to-face 100% of this time.  Estimated blood loss <50 mL. There were no immediate complications during the procedure.    Coronary Findings    Dominance: Right   Left Main   . Ost LM to LM lesion, 70% stenosed.     Left Anterior Descending   . Prox LAD to Mid LAD lesion, 90% stenosed.   . Mid LAD lesion, 100% stenosed.   . First Diagonal Branch   The vessel is small in size.     Left Circumflex   . Ost Cx to Prox Cx lesion, 70% stenosed.   . Mid Cx lesion, 80% stenosed.   . First Obtuse Marginal Branch   The vessel is small in size and exhibits minimal luminal irregularities.   . Second Obtuse Marginal Branch   The vessel exhibits minimal luminal irregularities.     Right Coronary Artery   . Ost RCA lesion, 100% stenosed.   . Right Posterior Descending Artery   RPDA filled by collaterals from 3rd Mrg.       Right Heart Pressures Hemodynamic findings consistent with mild pulmonary hypertension. Elevated LV EDP consistent  with volume overload.    Wall Motion                 Left Heart    Left Ventricle There is moderate to severe left ventricular systolic dysfunction. The ejection fraction could not be assess  due to 30-35.    Coronary Diagrams    Diagnostic Diagram            Implants     No implant documentation for this case.    PACS Images    Show images for Cardiac catheterization     Link to Procedure Log    Procedure Log      Hemo Data       Most Recent Value   Fick Cardiac Output  5.95 L/min   Fick Cardiac Output Index  2.66 (L/min)/BSA   RA A Wave  11 mmHg   RA V Wave  10 mmHg   RA Mean  8 mmHg   RV Systolic Pressure  45 mmHg   RV Diastolic Pressure  4 mmHg   RV EDP  10 mmHg   PA Systolic Pressure  42 mmHg   PA Diastolic Pressure  18 mmHg   PA Mean  28 mmHg   PW A Wave  22 mmHg   PW V Wave  27 mmHg   PW Mean  19 mmHg   AO Systolic Pressure  99 mmHg   AO Diastolic Pressure  66 mmHg   AO Mean  81 mmHg   LV Systolic Pressure  95 mmHg   LV Diastolic Pressure  4 mmHg   LV EDP  22 mmHg   Arterial Occlusion Pressure Extended Systolic Pressure  92 mmHg   Arterial Occlusion Pressure Extended Diastolic Pressure  62 mmHg   Arterial Occlusion Pressure Extended Mean Pressure  76 mmHg   Left Ventricular Apex Extended Systolic Pressure  123XX123 mmHg   Left Ventricular Apex Extended Diastolic Pressure  11 mmHg   Left Ventricular Apex Extended EDP Pressure  23 mmHg   QP/QS  1   TPVR Index  10.54 HRUI   TSVR Index  30.49 HRUI   PVR SVR Ratio  0.12   TPVR/TSVR Ratio  0.35       Impression:  Patient has left main and severe three-vessel coronary artery disease with ischemic cardiomyopathy. He presents with recent onset symptoms of chest pain and shortness of breath consistent with unstable angina complicated by acute systolic congestive heart failure and new onset paroxysmal atrial fibrillation. The patient converted to sinus rhythm  at the time of his admission and has maintained sinus rhythm ever since. Symptoms of congestive heart failure have improved rapidly. The patient has not had recurrent chest pain since admission. I have personally reviewed the patient's transthoracic echocardiogram and diagnostic catheterization. The patient has severe left ventricular systolic dysfunction with findings suggestive of previous myocardial infarctions in the past. There was type IIIB dysfunction of the mitral valve and at moderate mitral regurgitation at the time of his echocardiogram on 12/14/2015.  At present the patient does not have a systolic murmur on physical exam. Diagnostic catheterization reveals severe left main and three-vessel coronary artery disease with 70% left main stenosis, 100% chronic occlusion of the mid left anterior descending coronary artery, 100% chronic occlusion of the ostial right coronary artery, and 70% proximal stenosis of the left circumflex coronary artery. At the time of catheterization there was only mild pulmonary hypertension and cardiac output was normal. Distal target vessels are not well visualized but the patient appears to have graftable targets in all 3 major vascular territories. I agree the patient needs surgical revascularization.      Plan:  I have reviewed the indications, risks, and potential benefits of coronary artery bypass grafting with the patient and his  wife.  Alternative treatment strategies have been discussed, including the relative risks, benefits and long term prognosis associated with medical therapy, percutaneous coronary intervention, and surgical revascularization.  Plan for intraoperative transesophageal echocardiogram and possible mitral valve repair at been discussed. Plans for clipping of the left atrial appendage have been discussed. The patient understands and accepts all potential associated risks of surgery including but not limited to risk of death, stroke or other  neurologic complication, myocardial infarction, congestive heart failure, respiratory failure, renal failure, bleeding requiring blood transfusion and/or reexploration, aortic dissection or other major vascular complication, arrhythmia, heart block or bradycardia requiring permanent pacemaker, pneumonia, pleural effusion, wound infection, pulmonary embolus or other thromboembolic complication, chronic pain or other delayed complications related to median sternotomy, or the late recurrence of symptomatic ischemic heart disease and/or congestive heart failure.  The importance of long term risk modification have been emphasized, most notably that the patient needs to quit smoking permanently.  All questions answered.  We tentatively plan to proceed with surgery on Friday, 12/17/2015.   I spent in excess of 120 minutes during the conduct of this hospital consultation and >50% of this time involved direct face-to-face encounter for counseling and/or coordination of the patient's care.    Valentina Gu. Roxy Manns, MD 12/16/2015 6:17 PM

## 2015-12-16 NOTE — Progress Notes (Signed)
Patient: [name] [name] / Admit Date: 12/13/2015 / Date of Encounter: 12/16/2015, 2:23 PM  Subjective: Walked the halls without any further SOB. No chest pain. He says he can lay flat for cath today.  Objective: Telemetry: NSR occ PACs, rare blocked PACs Physical Exam: Blood pressure 133/84, pulse 81, temperature 98.3 F (36.8 C), temperature source Oral, resp. rate 18, height 6' (1.829 m), weight 218 lb 1.6 oz (98.93 kg), SpO2 98 %. General: Well developed, well nourished WM, in no acute distress. Head: Normocephalic, atraumatic, sclera non-icteric, no xanthomas, nares are without discharge. Neck: Negative for carotid bruits. JVP not elevated. Lungs: Soft crackles at bases, otherwise no wheezes or rhonchi. Breathing is unlabored. Heart: RRR S1 S2 without murmurs, rubs, or gallops.  Abdomen: Soft, non-tender, non-distended with normoactive bowel sounds. No rebound/guarding. Extremities: No clubbing or cyanosis. No edema. Distal pedal pulses are 2+ and equal bilaterally. Neuro: Alert and oriented X 3. Moves all extremities spontaneously. Psych:  Responds to questions appropriately with a normal affect.  Intake/Output Summary (Last 24 hours) at 12/16/15 1423 Last data filed at 12/16/15 0900  Gross per 24 hour  Intake    765 ml  Output      0 ml  Net    765 ml   Inpatient Medications:  . aspirin EC  81 mg Oral Daily  . atorvastatin  80 mg Oral q1800  . carvedilol  6.25 mg Oral BID WC  . furosemide  20 mg Intravenous BID  . lisinopril  2.5 mg Oral Daily  . Living Better with Heart Failure Book   Does not apply Once  . off the beat book   Does not apply Once  . pantoprazole  40 mg Oral Daily  . sodium chloride flush  3 mL Intravenous Q12H  . sodium chloride flush  3 mL Intravenous Q12H   Infusions:  . heparin 1,450 Units/hr (12/16/15 0115)   Labs:  Recent Labs  12/13/15 1645 12/14/15 0440 12/16/15 0714  NA 135 137 139  K 4.0 3.6 3.9  CL 102 106 103  CO2 23 23 25     GLUCOSE 113* 125* 110*  BUN 13 14 11   CREATININE 1.03 0.91 1.10  CALCIUM 8.7* 8.4* 9.0  MG 2.0  --   --    No results for input(s): AST, ALT, ALKPHOS, BILITOT, PROT, ALBUMIN in the last 72 hours.  Recent Labs  12/14/15 0440 12/15/15 0537 12/16/15 1221  WBC 7.9 7.4 9.1  NEUTROABS 4.0  --   --   HGB 12.5* 11.5* 13.0  HCT 36.7* 35.4* 39.1  MCV 91.8 91.5 91.8  PLT 305 263 316    Recent Labs  12/14/15 0158 12/14/15 0440 12/14/15 1138 12/15/15 0537  TROPONINI 0.61* 0.54* 0.33* 0.22*   Invalid input(s): POCBNP  Recent Labs  12/13/15 1525  HGBA1C 4.8    Radiology/Studies:  US Abdomen Complete  12/01/2015  CLINICAL DATA:  Seven-eight weeks of right upper quadrant pain with nausea and vomiting; history of gastroesophageal reflux. EXAM: ABDOMEN ULTRASOUND COMPLETE COMPARISON:  None FINDINGS: Gallbladder: No gallstones or wall thickening visualized. No sonographic Murphy sign noted by sonographer. Common bile duct: Diameter: 2 mm Liver: No focal lesion identified. Within normal limits in parenchymal echogenicity. IVC: No abnormality visualized. Pancreas: Visualization of the pancreas was limited due to bowel gas. Spleen: Size and appearance within normal limits. Right Kidney: Length: 11.9 cm. Echogenicity within normal limits. No mass or hydronephrosis visualized. Left Kidney: Length: 11.3 cm. Echogenicity within normal limits.  No mass or hydronephrosis visualized. Abdominal aorta: No aneurysm visualized. Other findings: There is no ascites. IMPRESSION: 1. No acute hepatobiliary abnormality is observed. Visualization of the pancreas was limited however. If gallbladder dysfunction is suspected clinically, a nuclear medicine hepatobiliary scan may be useful. 2. No acute abnormality is observed within the abdomen. Electronically Signed   By: David  Martinique M.D.   On: 12/01/2015 09:55   Dg Chest Port 1 View  12/13/2015  CLINICAL DATA:  Shortness of Breath and atrial fibrillation, history of  tobacco use EXAM: PORTABLE CHEST 1 VIEW COMPARISON:  None. FINDINGS: Cardiac shadow is mildly enlarged. Mild vascular congestion is noted without focal infiltrate. No pulmonary edema is seen. No bony abnormality is noted. IMPRESSION: Mild vascular congestion. Electronically Signed   By: Inez Catalina M.D.   On: 12/13/2015 17:05   TTE: 12/14/2015 - Left ventricle: The cavity size was normal. Wall thickness was  increased in a pattern of mild LVH. Systolic function was  severely reduced. The estimated ejection fraction was in the  range of 20% to 25%. Diffuse hypokinesis. There is akinesis of  the basalinferior myocardium. There is severe hypokinesis of the  mid-apicalanteroseptal myocardium. Doppler parameters are  consistent with restrictive physiology, indicative of decreased  left ventricular diastolic compliance and/or increased left  atrial pressure. - Aortic valve: Mildly calcified annulus. Trileaflet; mildly  calcified leaflets. - Mitral valve: There was mild to moderate regurgitation. - Left atrium: The atrium was severely dilated. - Right ventricle: Systolic function was moderately reduced. - Right atrium: The atrium was at the upper limits of normal in  size. Central venous pressure (est): 15 mm Hg. - Tricuspid valve: There was trivial regurgitation. - Pulmonary arteries: PA peak pressure: 51 mm Hg (S). - Pericardium, extracardiac: There was no pericardial effusion.  Impressions:  - Mild LVH with LVEF 20-25%. There is diffuse hypokinesis with  basal inferior akinesis and severe hypokinesis of the mid to  apical anteroseptal walls. Acoustic shadowing noted at apex,  Definity contrast shows slow swirling flow in the LV apex  insistent with low output but no definitive mural thrombus.  Restrictive diastolic filling pattern with increased LV filling  pressures. Severe left atrial enlargement. Mild to moderate  mitral regurgitation. Mildly calcified aortic  annulus. Moderately  reduced RV contraction. Trivial tricuspid regurgitation with PASP  estimated 51 mmHg and elevated CVP.  LHC: 12/15/2015  There is moderate to severe left ventricular systolic dysfunction.  Ost LM to LM lesion, 70% stenosed.  Ost RCA lesion, 100% stenosed.  Mid LAD lesion, 100% stenosed.  Prox LAD to Mid LAD lesion, 90% stenosed.  Ost Cx to Prox Cx lesion, 70% stenosed.  Mid Cx lesion, 80% stenosed.  1. Significant left main and severe three-vessel coronary artery disease with ostial occlusion of the right coronary artery and occlusion of the mid LAD. 2. Moderately to severely reduced LV systolic function with an ejection fraction of 30-35%. 3. Right heart catheterization showed mild pulmonary hypertension, moderately elevated filling pressures and normal cardiac output.     Assessment and Plan   42M with obesity, GERD & ongoing tobacco abuse found to be in atrial fib at PCP's office, sent to [facility] where he was subsequently txf to [facility]. He was found to be hypertensive with elevated troponin, leukocytosis, hyperlipidemia (LDL 121), and newly documented cardiomyopathy with biventricular failure, EF 20-25%. Converted to NSR after being treated with IV diltiazem. TSH wnl. A1C 4.8.   1. NSTEMI, patient's with cardiac cath yesterday showed severe  three-vessel disease including 70% of ostial left main with significant LV dysfunction, he is awaiting CT surgery evaluation for possible bypass surgery. Will continue IV heparin. Also continue aspirin and high-dose atorvastatin.  2. Ischemic cardiomyopathy with LVEF 25-30% and regional wall motion abnormalities, based on  severely dilated left atrium these is a chronic process. He was started on carvedilol and lisinopril yesterday.  3. Newly diagnosed atrial fibrillation now in sinus rhythm who continued to monitor. He is on IV heparin for anticoagulation.  4. Ongoing tobacco abuse: cessation advised.  5.  Hypercholesterolemia: now on new statin. Will need recheck LFTs/lipids in 6 weeks.    Ena Dawley 12/16/2015

## 2015-12-16 NOTE — Progress Notes (Signed)
Pt is ambulating independently without CP. Discussed sternal precautions, mobility, IS, d/c planning. Gave booklet, guideline, and video to watch. Voiced understanding. He can walk independently as long as he is not having CP. Will f/u as low priority. [id number] Yves Dill CES, ACSM 3:06 PM 12/16/2015

## 2015-12-16 NOTE — Progress Notes (Addendum)
ANTICOAGULATION CONSULT NOTE   Pharmacy Consult for Heparin Indication: ACS  No Known Allergies  Patient Measurements: Height: 6' (182.9 cm) Weight: 218 lb 1.6 oz (98.93 kg) IBW/kg (Calculated) : 77.6 HEPARIN DW (KG): 98.7   Vital Signs: Temp: 98.3 F (36.8 C) (06/15 0500) Temp Source: Oral (06/15 0500) BP: 133/84 mmHg (06/15 0500) Pulse Rate: 81 (06/14 2300)  Labs:  Recent Labs  12/13/15 1645  12/14/15 0440 12/14/15 1138 12/15/15 0537 12/15/15 0914  HGB 13.5  --  12.5*  --  11.5*  --   HCT 39.1  --  36.7*  --  35.4*  --   PLT 365  --  305  --  263  --   LABPROT 14.6  --  15.7*  --  15.3*  --   INR 1.12  --  1.24  --  1.19  --   HEPARINUNFRC  --   --   --   --   --  0.67  CREATININE 1.03  --  0.91  --   --   --   TROPONINI 0.07*  < > 0.54* 0.33* 0.22*  --   < > = values in this interval not displayed. Estimated Creatinine Clearance: 114.3 mL/min (by C-G formula based on Cr of 0.91).   Assessment: 55 yo man with ACS s/p cath with 3V CAD for possible CABG. Heparin resumed post cath and heparin level= 0.67.  Goal of Therapy:  Heparin level 0.3-0.7 units/ml Monitor platelets by anticoagulation protocol: Yes   Plan:  -No heparin changes neeed -Heparin level in 6 hours to confirm and heparin level daily wth CBC daily  Hildred Laser, Pharm D 12/16/2015 8:28 AM  Addendum -Heparin level = 0.31 -The prior heparin level was 0.14 not 0.67 however the heparin rate has not been changed so the prior lab may be an error  Plan  -No heparin changes -Daily heparin level and CBC  Hildred Laser, Pharm D 12/16/2015 3:11 PM

## 2015-12-17 ENCOUNTER — Inpatient Hospital Stay (HOSPITAL_COMMUNITY): Payer: 59

## 2015-12-17 ENCOUNTER — Inpatient Hospital Stay (HOSPITAL_COMMUNITY): Payer: 59 | Admitting: Anesthesiology

## 2015-12-17 ENCOUNTER — Encounter (HOSPITAL_COMMUNITY)
Admission: EM | Disposition: A | Discharge status: Home Health | Payer: Self-pay | Source: Home / Self Care | Attending: Thoracic Surgery (Cardiothoracic Vascular Surgery)

## 2015-12-17 ENCOUNTER — Encounter (HOSPITAL_COMMUNITY): Payer: Self-pay | Admitting: Thoracic Surgery (Cardiothoracic Vascular Surgery)

## 2015-12-17 DIAGNOSIS — Z951 Presence of aortocoronary bypass graft: Secondary | ICD-10-CM

## 2015-12-17 DIAGNOSIS — I2510 Atherosclerotic heart disease of native coronary artery without angina pectoris: Secondary | ICD-10-CM

## 2015-12-17 DIAGNOSIS — I6522 Occlusion and stenosis of left carotid artery: Secondary | ICD-10-CM

## 2015-12-17 HISTORY — DX: Presence of aortocoronary bypass graft: Z95.1

## 2015-12-17 HISTORY — PX: CLIPPING OF ATRIAL APPENDAGE: SHX5773

## 2015-12-17 HISTORY — PX: CORONARY ARTERY BYPASS GRAFT: SHX141

## 2015-12-17 LAB — BASIC METABOLIC PANEL
Anion gap: 7 (ref 5–15)
BUN: 11 mg/dL (ref 6–20)
CHLORIDE: 104 mmol/L (ref 101–111)
CO2: 29 mmol/L (ref 22–32)
CREATININE: 1.17 mg/dL (ref 0.61–1.24)
Calcium: 9.1 mg/dL (ref 8.9–10.3)
GFR calc non Af Amer: >60 mL/min (ref >60)
Glucose, Bld: 107 mg/dL — ABNORMAL HIGH (ref 65–99)
POTASSIUM: 4.2 mmol/L (ref 3.5–5.1)
Sodium: 140 mmol/L (ref 135–145)

## 2015-12-17 LAB — POCT I-STAT 3, ART BLOOD GAS (G3+)
Acid-Base Excess: 2 mmol/L (ref 0.0–2.0)
Acid-base deficit: 1 mmol/L (ref 0.0–2.0)
Acid-base deficit: 2 mmol/L (ref 0.0–2.0)
Bicarbonate: 28.4 mEq/L — ABNORMAL HIGH (ref 20.0–24.0)
Bicarbonate: 24.7 mEq/L — ABNORMAL HIGH (ref 20.0–24.0)
Bicarbonate: 23.5 mEq/L (ref 20.0–24.0)
O2 Saturation: 100 %
O2 Saturation: 89 %
O2 Saturation: 91 %
Patient temperature: 36.6
Patient temperature: 37
TCO2: 30 mmol/L (ref 0–100)
TCO2: 26 mmol/L (ref 0–100)
TCO2: 25 mmol/L (ref 0–100)
pCO2 arterial: 50.2 mmHg — ABNORMAL HIGH (ref 35.0–45.0)
pCO2 arterial: 43.3 mmHg (ref 35.0–45.0)
pCO2 arterial: 39.9 mmHg (ref 35.0–45.0)
pH, Arterial: 7.36 (ref 7.350–7.450)
pH, Arterial: 7.362 (ref 7.350–7.450)
pH, Arterial: 7.377 (ref 7.350–7.450)
pO2, Arterial: 343 mmHg — ABNORMAL HIGH (ref 80.0–100.0)
pO2, Arterial: 57 mmHg — ABNORMAL LOW (ref 80.0–100.0)
pO2, Arterial: 62 mmHg — ABNORMAL LOW (ref 80.0–100.0)

## 2015-12-17 LAB — POCT I-STAT, CHEM 8
BUN: 10 mg/dL (ref 6–20)
BUN: 9 mg/dL (ref 6–20)
BUN: 9 mg/dL (ref 6–20)
BUN: 9 mg/dL (ref 6–20)
BUN: 9 mg/dL (ref 6–20)
Calcium, Ion: 1.17 mmol/L (ref 1.12–1.23)
Calcium, Ion: 1.19 mmol/L (ref 1.12–1.23)
Calcium, Ion: 1.03 mmol/L — ABNORMAL LOW (ref 1.12–1.23)
Calcium, Ion: 1.08 mmol/L — ABNORMAL LOW (ref 1.12–1.23)
Calcium, Ion: 1.31 mmol/L — ABNORMAL HIGH (ref 1.12–1.23)
Chloride: 101 mmol/L (ref 101–111)
Chloride: 102 mmol/L (ref 101–111)
Chloride: 98 mmol/L — ABNORMAL LOW (ref 101–111)
Chloride: 101 mmol/L (ref 101–111)
Chloride: 104 mmol/L (ref 101–111)
Creatinine, Ser: 0.8 mg/dL (ref 0.61–1.24)
Creatinine, Ser: 0.8 mg/dL (ref 0.61–1.24)
Creatinine, Ser: 0.7 mg/dL (ref 0.61–1.24)
Creatinine, Ser: 0.8 mg/dL (ref 0.61–1.24)
Creatinine, Ser: 0.9 mg/dL (ref 0.61–1.24)
Glucose, Bld: 87 mg/dL (ref 65–99)
Glucose, Bld: 104 mg/dL — ABNORMAL HIGH (ref 65–99)
Glucose, Bld: 95 mg/dL (ref 65–99)
Glucose, Bld: 112 mg/dL — ABNORMAL HIGH (ref 65–99)
Glucose, Bld: 119 mg/dL — ABNORMAL HIGH (ref 65–99)
HCT: 35 % — ABNORMAL LOW (ref 39.0–52.0)
HCT: 33 % — ABNORMAL LOW (ref 39.0–52.0)
HCT: 28 % — ABNORMAL LOW (ref 39.0–52.0)
HCT: 28 % — ABNORMAL LOW (ref 39.0–52.0)
HCT: 30 % — ABNORMAL LOW (ref 39.0–52.0)
Hemoglobin: 11.9 g/dL — ABNORMAL LOW (ref 13.0–17.0)
Hemoglobin: 11.2 g/dL — ABNORMAL LOW (ref 13.0–17.0)
Hemoglobin: 9.5 g/dL — ABNORMAL LOW (ref 13.0–17.0)
Hemoglobin: 9.5 g/dL — ABNORMAL LOW (ref 13.0–17.0)
Hemoglobin: 10.2 g/dL — ABNORMAL LOW (ref 13.0–17.0)
Potassium: 3.6 mmol/L (ref 3.5–5.1)
Potassium: 3.6 mmol/L (ref 3.5–5.1)
Potassium: 3.7 mmol/L (ref 3.5–5.1)
Potassium: 4.5 mmol/L (ref 3.5–5.1)
Potassium: 3.9 mmol/L (ref 3.5–5.1)
Sodium: 143 mmol/L (ref 135–145)
Sodium: 140 mmol/L (ref 135–145)
Sodium: 139 mmol/L (ref 135–145)
Sodium: 138 mmol/L (ref 135–145)
Sodium: 139 mmol/L (ref 135–145)
TCO2: 30 mmol/L (ref 0–100)
TCO2: 27 mmol/L (ref 0–100)
TCO2: 27 mmol/L (ref 0–100)
TCO2: 26 mmol/L (ref 0–100)
TCO2: 27 mmol/L (ref 0–100)

## 2015-12-17 LAB — CBC
HEMATOCRIT: 40 % (ref 39.0–52.0)
HEMATOCRIT: 33.1 % — AB (ref 39.0–52.0)
HEMOGLOBIN: 13.2 g/dL (ref 13.0–17.0)
HEMOGLOBIN: 10.9 g/dL — AB (ref 13.0–17.0)
MCH: 29.9 pg (ref 26.0–34.0)
MCH: 29.7 pg (ref 26.0–34.0)
MCHC: 33 g/dL (ref 30.0–36.0)
MCHC: 32.9 g/dL (ref 30.0–36.0)
MCV: 90.5 fL (ref 78.0–100.0)
MCV: 90.2 fL (ref 78.0–100.0)
Platelets: 312 10*3/uL (ref 150–400)
Platelets: 192 10*3/uL (ref 150–400)
RBC: 4.42 MIL/uL (ref 4.22–5.81)
RBC: 3.67 MIL/uL — AB (ref 4.22–5.81)
RDW: 13 % (ref 11.5–15.5)
RDW: 13 % (ref 11.5–15.5)
WBC: 7.9 10*3/uL (ref 4.0–10.5)
WBC: 14.8 10*3/uL — ABNORMAL HIGH (ref 4.0–10.5)

## 2015-12-17 LAB — POCT I-STAT 4, (NA,K, GLUC, HGB,HCT)
Glucose, Bld: 122 mg/dL — ABNORMAL HIGH (ref 65–99)
HCT: 31 % — ABNORMAL LOW (ref 39.0–52.0)
Hemoglobin: 10.5 g/dL — ABNORMAL LOW (ref 13.0–17.0)
Potassium: 3.8 mmol/L (ref 3.5–5.1)
Sodium: 141 mmol/L (ref 135–145)

## 2015-12-17 LAB — SURGICAL PCR SCREEN
MRSA, PCR: NEGATIVE
Staphylococcus aureus: NEGATIVE

## 2015-12-17 LAB — URINALYSIS, ROUTINE W REFLEX MICROSCOPIC
GLUCOSE, UA: NEGATIVE mg/dL
HGB URINE DIPSTICK: NEGATIVE
KETONES UR: NEGATIVE mg/dL
LEUKOCYTES UA: NEGATIVE
Nitrite: NEGATIVE
PH: 6 (ref 5.0–8.0)
PROTEIN: NEGATIVE mg/dL
Specific Gravity, Urine: 1.024 (ref 1.005–1.030)

## 2015-12-17 LAB — PLATELET COUNT: Platelets: 249 10*3/uL (ref 150–400)

## 2015-12-17 LAB — HEMOGLOBIN AND HEMATOCRIT, BLOOD
HCT: 27 % — ABNORMAL LOW (ref 39.0–52.0)
HEMOGLOBIN: 8.9 g/dL — AB (ref 13.0–17.0)

## 2015-12-17 LAB — PROTIME-INR
INR: 1.47 (ref 0.00–1.49)
PROTHROMBIN TIME: 17.9 s — AB (ref 11.6–15.2)

## 2015-12-17 LAB — APTT
APTT: 35 s (ref 24–37)
aPTT: 34 seconds (ref 24–37)

## 2015-12-17 SURGERY — CORONARY ARTERY BYPASS GRAFTING (CABG)
Anesthesia: General | Site: Chest

## 2015-12-17 MED ORDER — ACETAMINOPHEN 160 MG/5ML PO SOLN
650.0000 mg | Freq: Once | ORAL | Status: AC
  Administered 2015-12-17: 649.6 mg

## 2015-12-17 MED ORDER — AMIODARONE HCL IN DEXTROSE 360-4.14 MG/200ML-% IV SOLN
INTRAVENOUS | Status: DC | PRN
  Administered 2015-12-17: 30 mg/h via INTRAVENOUS

## 2015-12-17 MED ORDER — METOPROLOL TARTRATE 12.5 MG HALF TABLET: 12.5000 mg | ORAL_TABLET | Freq: Two times a day (BID) | ORAL | Status: DC

## 2015-12-17 MED ORDER — ROCURONIUM BROMIDE 50 MG/5ML IV SOLN
INTRAVENOUS | Status: AC
Start: 2015-12-17 — End: 2015-12-17
  Filled 2015-12-17: qty 1

## 2015-12-17 MED ORDER — ACETAMINOPHEN 650 MG RE SUPP: 650.0000 mg | Freq: Once | RECTAL | Status: AC

## 2015-12-17 MED ORDER — HEPARIN SODIUM (PORCINE) 1000 UNIT/ML IJ SOLN
INTRAMUSCULAR | Status: DC | PRN
  Administered 2015-12-17: 3000 [IU] via INTRAVENOUS
  Administered 2015-12-17: 37000 [IU] via INTRAVENOUS

## 2015-12-17 MED ORDER — PROTAMINE SULFATE 10 MG/ML IV SOLN
INTRAVENOUS | Status: AC
  Filled 2015-12-17: qty 50

## 2015-12-17 MED ORDER — MIDAZOLAM HCL 2 MG/2ML IJ SOLN
2.0000 mg | Freq: Once | INTRAMUSCULAR | Status: AC
  Administered 2015-12-17: 2 mg via INTRAVENOUS

## 2015-12-17 MED ORDER — PROTAMINE SULFATE 10 MG/ML IV SOLN
INTRAVENOUS | Status: DC | PRN
  Administered 2015-12-17: 290 mg via INTRAVENOUS
  Administered 2015-12-17: 10 mg via INTRAVENOUS

## 2015-12-17 MED ORDER — VECURONIUM BROMIDE 10 MG IV SOLR
INTRAVENOUS | Status: AC
  Filled 2015-12-17: qty 20

## 2015-12-17 MED ORDER — BISACODYL 5 MG PO TBEC
10.0000 mg | DELAYED_RELEASE_TABLET | Freq: Every day | ORAL | Status: DC
  Administered 2015-12-18 – 2015-12-20 (×3): 10 mg via ORAL
  Filled 2015-12-17 (×3): qty 2

## 2015-12-17 MED ORDER — NITROGLYCERIN IN D5W 200-5 MCG/ML-% IV SOLN: 0.0000 ug/min | INTRAVENOUS | Status: DC

## 2015-12-17 MED ORDER — MIDAZOLAM HCL 2 MG/2ML IJ SOLN
INTRAMUSCULAR | Status: AC
  Administered 2015-12-17: 2 mg
  Filled 2015-12-17: qty 2

## 2015-12-17 MED ORDER — DEXTROSE 5 % IV SOLN
1.5000 g | Freq: Two times a day (BID) | INTRAVENOUS | Status: AC
  Administered 2015-12-17 – 2015-12-19 (×4): 1.5 g via INTRAVENOUS
  Filled 2015-12-17 (×4): qty 1.5

## 2015-12-17 MED ORDER — MIDAZOLAM HCL 5 MG/5ML IJ SOLN
INTRAMUSCULAR | Status: DC | PRN
Start: 2015-12-17 — End: 2015-12-17
  Administered 2015-12-17: 2 mg via INTRAVENOUS
  Administered 2015-12-17: 1 mg via INTRAVENOUS
  Administered 2015-12-17: 2 mg via INTRAVENOUS
  Administered 2015-12-17: 3 mg via INTRAVENOUS
  Administered 2015-12-17: 2 mg via INTRAVENOUS

## 2015-12-17 MED ORDER — PHENYLEPHRINE HCL 10 MG/ML IJ SOLN
0.0000 ug/min | INTRAVENOUS | Status: DC
  Administered 2015-12-17: 5 ug/min via INTRAVENOUS
  Administered 2015-12-18: 45 ug/min via INTRAVENOUS
  Administered 2015-12-19: 15 ug/min via INTRAVENOUS
  Filled 2015-12-17 (×4): qty 2

## 2015-12-17 MED ORDER — LACTATED RINGERS IV SOLN
INTRAVENOUS | Status: DC | PRN
  Administered 2015-12-17: 12:00:00 via INTRAVENOUS

## 2015-12-17 MED ORDER — PROTAMINE SULFATE 10 MG/ML IV SOLN
INTRAVENOUS | Status: AC
  Filled 2015-12-17: qty 5

## 2015-12-17 MED ORDER — DEXMEDETOMIDINE HCL IN NACL 200 MCG/50ML IV SOLN
0.0000 ug/kg/h | INTRAVENOUS | Status: DC
  Administered 2015-12-17 – 2015-12-18 (×4): 0.7 ug/kg/h via INTRAVENOUS
  Filled 2015-12-17 (×5): qty 50

## 2015-12-17 MED ORDER — MIDAZOLAM HCL 2 MG/2ML IJ SOLN
2.0000 mg | INTRAMUSCULAR | Status: DC | PRN
  Administered 2015-12-17 – 2015-12-18 (×4): 2 mg via INTRAVENOUS
  Filled 2015-12-17 (×6): qty 2

## 2015-12-17 MED ORDER — SODIUM CHLORIDE 0.9 % IV SOLN
INTRAVENOUS | Status: AC
  Administered 2015-12-17: 100 mL/h via INTRAVENOUS

## 2015-12-17 MED ORDER — INSULIN REGULAR BOLUS VIA INFUSION
0.0000 [IU] | Freq: Three times a day (TID) | INTRAVENOUS | Status: DC
  Filled 2015-12-17: qty 10

## 2015-12-17 MED ORDER — AMINOCAPROIC ACID 250 MG/ML IV SOLN
INTRAVENOUS | Status: DC | PRN
  Administered 2015-12-17: 5 g via INTRAVENOUS

## 2015-12-17 MED ORDER — DOPAMINE-DEXTROSE 3.2-5 MG/ML-% IV SOLN: 0.0000 ug/kg/min | INTRAVENOUS | Status: DC

## 2015-12-17 MED ORDER — METOPROLOL TARTRATE 25 MG/10 ML ORAL SUSPENSION: 12.5000 mg | Freq: Two times a day (BID) | ORAL | Status: DC

## 2015-12-17 MED ORDER — MIDAZOLAM HCL 10 MG/2ML IJ SOLN
INTRAMUSCULAR | Status: AC
  Filled 2015-12-17: qty 2

## 2015-12-17 MED ORDER — DOPAMINE-DEXTROSE 3.2-5 MG/ML-% IV SOLN
0.0000 ug/kg/min | INTRAVENOUS | Status: DC
  Administered 2015-12-17: 3 ug/kg/min via INTRAVENOUS
  Administered 2015-12-18: 5 ug/kg/min via INTRAVENOUS
  Filled 2015-12-17: qty 250

## 2015-12-17 MED ORDER — PHENYLEPHRINE 40 MCG/ML (10ML) SYRINGE FOR IV PUSH (FOR BLOOD PRESSURE SUPPORT)
PREFILLED_SYRINGE | INTRAVENOUS | Status: AC
Start: 2015-12-17 — End: 2015-12-17
  Filled 2015-12-17: qty 40

## 2015-12-17 MED ORDER — PHENYLEPHRINE HCL 10 MG/ML IJ SOLN
INTRAMUSCULAR | Status: DC | PRN
  Administered 2015-12-17: 40 ug via INTRAVENOUS
  Administered 2015-12-17: 80 ug via INTRAVENOUS

## 2015-12-17 MED ORDER — MORPHINE SULFATE (PF) 2 MG/ML IV SOLN
1.0000 mg | INTRAVENOUS | Status: AC | PRN
  Administered 2015-12-17: 4 mg via INTRAVENOUS
  Filled 2015-12-17 (×3): qty 2

## 2015-12-17 MED ORDER — ASPIRIN 81 MG PO CHEW: 324.0000 mg | CHEWABLE_TABLET | Freq: Every day | ORAL | Status: DC

## 2015-12-17 MED ORDER — ONDANSETRON HCL 4 MG/2ML IJ SOLN
4.0000 mg | Freq: Four times a day (QID) | INTRAMUSCULAR | Status: DC | PRN
  Administered 2015-12-18 – 2015-12-20 (×6): 4 mg via INTRAVENOUS
  Filled 2015-12-17 (×6): qty 2

## 2015-12-17 MED ORDER — TRAMADOL HCL 50 MG PO TABS
50.0000 mg | ORAL_TABLET | ORAL | Status: DC | PRN
  Administered 2015-12-19: 100 mg via ORAL
  Filled 2015-12-17: qty 2

## 2015-12-17 MED ORDER — SODIUM CHLORIDE 0.9% FLUSH
3.0000 mL | Freq: Two times a day (BID) | INTRAVENOUS | Status: DC
  Administered 2015-12-18 – 2015-12-21 (×7): 3 mL via INTRAVENOUS

## 2015-12-17 MED ORDER — ARTIFICIAL TEARS OP OINT
TOPICAL_OINTMENT | OPHTHALMIC | Status: DC | PRN
  Administered 2015-12-17: 1 via OPHTHALMIC

## 2015-12-17 MED ORDER — SODIUM CHLORIDE 0.9 % IV SOLN
INTRAVENOUS | Status: DC
  Administered 2015-12-17: 1.2 [IU]/h via INTRAVENOUS

## 2015-12-17 MED ORDER — MORPHINE SULFATE (PF) 2 MG/ML IV SOLN
2.0000 mg | INTRAVENOUS | Status: DC | PRN
  Administered 2015-12-17: 4 mg via INTRAVENOUS
  Administered 2015-12-18: 2 mg via INTRAVENOUS
  Administered 2015-12-18 (×6): 4 mg via INTRAVENOUS
  Administered 2015-12-19: 2 mg via INTRAVENOUS
  Administered 2015-12-19: 4 mg via INTRAVENOUS
  Filled 2015-12-17 (×5): qty 2
  Filled 2015-12-17: qty 1
  Filled 2015-12-17: qty 2
  Filled 2015-12-17: qty 1

## 2015-12-17 MED ORDER — AMIODARONE HCL IN DEXTROSE 360-4.14 MG/200ML-% IV SOLN
30.0000 mg/h | INTRAVENOUS | Status: DC
  Filled 2015-12-17: qty 200

## 2015-12-17 MED ORDER — ACETAMINOPHEN 500 MG PO TABS
1000.0000 mg | ORAL_TABLET | Freq: Four times a day (QID) | ORAL | Status: DC
  Administered 2015-12-18 – 2015-12-21 (×6): 1000 mg via ORAL
  Filled 2015-12-17 (×10): qty 2

## 2015-12-17 MED ORDER — METOPROLOL TARTRATE 5 MG/5ML IV SOLN: 2.5000 mg | INTRAVENOUS | Status: DC | PRN

## 2015-12-17 MED ORDER — OXYCODONE HCL 5 MG PO TABS
5.0000 mg | ORAL_TABLET | ORAL | Status: DC | PRN
  Administered 2015-12-18 – 2015-12-19 (×5): 10 mg via ORAL
  Filled 2015-12-17 (×5): qty 2

## 2015-12-17 MED ORDER — AMIODARONE LOAD VIA INFUSION
150.0000 mg | INTRAVENOUS | Status: DC
  Filled 2015-12-17: qty 83.34

## 2015-12-17 MED ORDER — SODIUM CHLORIDE 0.9 % IR SOLN
Status: DC | PRN
  Administered 2015-12-17: 5000 mL

## 2015-12-17 MED ORDER — LIDOCAINE HCL (CARDIAC) 20 MG/ML IV SOLN
INTRAVENOUS | Status: DC | PRN
  Administered 2015-12-17: 100 mg via INTRAVENOUS

## 2015-12-17 MED ORDER — ONDANSETRON HCL 4 MG/2ML IJ SOLN
INTRAMUSCULAR | Status: AC
  Filled 2015-12-17: qty 2

## 2015-12-17 MED ORDER — PROPOFOL 10 MG/ML IV BOLUS
INTRAVENOUS | Status: AC
  Filled 2015-12-17: qty 20

## 2015-12-17 MED ORDER — LACTATED RINGERS IV SOLN
INTRAVENOUS | Status: DC
  Administered 2015-12-17: 10:00:00 via INTRAVENOUS

## 2015-12-17 MED ORDER — ALBUMIN HUMAN 5 % IV SOLN
250.0000 mL | INTRAVENOUS | Status: AC | PRN
Start: 2015-12-17 — End: 2015-12-18
  Administered 2015-12-17: 500 mL via INTRAVENOUS
  Administered 2015-12-18: 250 mL via INTRAVENOUS
  Filled 2015-12-17: qty 250

## 2015-12-17 MED ORDER — MIDAZOLAM HCL 2 MG/2ML IJ SOLN
INTRAMUSCULAR | Status: AC
  Administered 2015-12-17: 2 mg via INTRAVENOUS
  Filled 2015-12-17: qty 2

## 2015-12-17 MED ORDER — SODIUM CHLORIDE 0.9% FLUSH: 3.0000 mL | INTRAVENOUS | Status: DC | PRN

## 2015-12-17 MED ORDER — ROCURONIUM BROMIDE 100 MG/10ML IV SOLN
INTRAVENOUS | Status: DC | PRN
  Administered 2015-12-17: 100 mg via INTRAVENOUS

## 2015-12-17 MED ORDER — HEPARIN SODIUM (PORCINE) 1000 UNIT/ML IJ SOLN
INTRAMUSCULAR | Status: AC
  Filled 2015-12-17: qty 2

## 2015-12-17 MED ORDER — LACTATED RINGERS IV SOLN
500.0000 mL | Freq: Once | INTRAVENOUS | Status: AC | PRN
  Administered 2015-12-17: 500 mL via INTRAVENOUS

## 2015-12-17 MED ORDER — LACTATED RINGERS IV SOLN
INTRAVENOUS | Status: DC
  Administered 2015-12-17: 20 mL/h via INTRAVENOUS

## 2015-12-17 MED ORDER — EPINEPHRINE HCL 0.1 MG/ML IJ SOSY
PREFILLED_SYRINGE | INTRAMUSCULAR | Status: AC
  Filled 2015-12-17: qty 10

## 2015-12-17 MED ORDER — METOPROLOL TARTRATE 12.5 MG HALF TABLET: 12.5000 mg | ORAL_TABLET | Freq: Once | ORAL | Status: DC

## 2015-12-17 MED ORDER — PANTOPRAZOLE SODIUM 40 MG PO TBEC
40.0000 mg | DELAYED_RELEASE_TABLET | Freq: Every day | ORAL | Status: DC
  Administered 2015-12-19 – 2015-12-20 (×2): 40 mg via ORAL
  Filled 2015-12-17 (×2): qty 1

## 2015-12-17 MED ORDER — BISACODYL 10 MG RE SUPP: 10.0000 mg | Freq: Every day | RECTAL | Status: DC

## 2015-12-17 MED ORDER — LACTATED RINGERS IV SOLN
INTRAVENOUS | Status: DC | PRN
  Administered 2015-12-17: 13:00:00 via INTRAVENOUS

## 2015-12-17 MED ORDER — VECURONIUM BROMIDE 10 MG IV SOLR
INTRAVENOUS | Status: DC | PRN
  Administered 2015-12-17: 5 mg via INTRAVENOUS
  Administered 2015-12-17: 3 mg via INTRAVENOUS

## 2015-12-17 MED ORDER — POTASSIUM CHLORIDE 10 MEQ/50ML IV SOLN
10.0000 meq | INTRAVENOUS | Status: AC
  Administered 2015-12-17 (×3): 10 meq via INTRAVENOUS

## 2015-12-17 MED ORDER — MILRINONE LACTATE IN DEXTROSE 20-5 MG/100ML-% IV SOLN
0.1250 ug/kg/min | INTRAVENOUS | Status: DC
  Administered 2015-12-17: 0.375 ug/kg/min via INTRAVENOUS
  Filled 2015-12-17: qty 100

## 2015-12-17 MED ORDER — AMIODARONE HCL IN DEXTROSE 360-4.14 MG/200ML-% IV SOLN
60.0000 mg/h | INTRAVENOUS | Status: DC
  Filled 2015-12-17 (×2): qty 200

## 2015-12-17 MED ORDER — FENTANYL CITRATE (PF) 250 MCG/5ML IJ SOLN
INTRAMUSCULAR | Status: AC
  Filled 2015-12-17: qty 25

## 2015-12-17 MED ORDER — ASPIRIN EC 325 MG PO TBEC
325.0000 mg | DELAYED_RELEASE_TABLET | Freq: Every day | ORAL | Status: DC
  Administered 2015-12-18 – 2015-12-20 (×3): 325 mg via ORAL
  Filled 2015-12-17 (×3): qty 1

## 2015-12-17 MED ORDER — VANCOMYCIN HCL IN DEXTROSE 1-5 GM/200ML-% IV SOLN
1000.0000 mg | Freq: Once | INTRAVENOUS | Status: AC
  Administered 2015-12-18: 1000 mg via INTRAVENOUS
  Filled 2015-12-17 (×2): qty 200

## 2015-12-17 MED ORDER — LACTATED RINGERS IV SOLN
INTRAVENOUS | Status: DC
  Administered 2015-12-17 (×2): 20 mL/h via INTRAVENOUS

## 2015-12-17 MED ORDER — AMIODARONE HCL IN DEXTROSE 360-4.14 MG/200ML-% IV SOLN
30.0000 mg/h | INTRAVENOUS | Status: AC
  Administered 2015-12-17 – 2015-12-19 (×6): 30 mg/h via INTRAVENOUS
  Filled 2015-12-17 (×5): qty 200

## 2015-12-17 MED ORDER — CALCIUM CHLORIDE 10 % IV SOLN
INTRAVENOUS | Status: AC
  Filled 2015-12-17: qty 20

## 2015-12-17 MED ORDER — SODIUM CHLORIDE 0.9 % IV SOLN: 250.0000 mL | INTRAVENOUS | Status: DC

## 2015-12-17 MED ORDER — SODIUM CHLORIDE 0.45 % IV SOLN
INTRAVENOUS | Status: DC | PRN
  Administered 2015-12-17: 20 mL/h via INTRAVENOUS

## 2015-12-17 MED ORDER — FAMOTIDINE IN NACL 20-0.9 MG/50ML-% IV SOLN
20.0000 mg | Freq: Two times a day (BID) | INTRAVENOUS | Status: AC
  Administered 2015-12-17 – 2015-12-18 (×2): 20 mg via INTRAVENOUS
  Filled 2015-12-17: qty 50

## 2015-12-17 MED ORDER — ACETAMINOPHEN 160 MG/5ML PO SOLN
1000.0000 mg | Freq: Four times a day (QID) | ORAL | Status: DC
  Administered 2015-12-18: 1000 mg
  Filled 2015-12-17: qty 40.6

## 2015-12-17 MED ORDER — SODIUM CHLORIDE 0.9 % IJ SOLN
OROMUCOSAL | Status: DC | PRN
  Administered 2015-12-17 (×5): 4 mL via TOPICAL

## 2015-12-17 MED ORDER — SODIUM CHLORIDE 0.9 % IV SOLN
INTRAVENOUS | Status: DC
  Administered 2015-12-17 – 2015-12-18 (×2): 20 mL/h via INTRAVENOUS

## 2015-12-17 MED ORDER — FENTANYL CITRATE (PF) 100 MCG/2ML IJ SOLN
INTRAMUSCULAR | Status: AC
  Administered 2015-12-17: 50 ug via INTRAVENOUS
  Filled 2015-12-17: qty 2

## 2015-12-17 MED ORDER — FENTANYL CITRATE (PF) 100 MCG/2ML IJ SOLN
INTRAMUSCULAR | Status: DC | PRN
  Administered 2015-12-17: 100 ug via INTRAVENOUS
  Administered 2015-12-17: 250 ug via INTRAVENOUS
  Administered 2015-12-17 (×4): 100 ug via INTRAVENOUS
  Administered 2015-12-17: 250 ug via INTRAVENOUS

## 2015-12-17 MED ORDER — MAGNESIUM SULFATE 4 GM/100ML IV SOLN
4.0000 g | Freq: Once | INTRAVENOUS | Status: AC
  Administered 2015-12-17: 4 g via INTRAVENOUS
  Filled 2015-12-17: qty 100

## 2015-12-17 MED ORDER — MILRINONE LACTATE IN DEXTROSE 20-5 MG/100ML-% IV SOLN
0.3000 ug/kg/min | INTRAVENOUS | Status: DC
  Administered 2015-12-17 – 2015-12-18 (×3): 0.2 ug/kg/min via INTRAVENOUS
  Filled 2015-12-17 (×2): qty 100

## 2015-12-17 MED ORDER — PROPOFOL 10 MG/ML IV BOLUS
INTRAVENOUS | Status: DC | PRN
  Administered 2015-12-17: 30 mg via INTRAVENOUS

## 2015-12-17 MED ORDER — DOCUSATE SODIUM 100 MG PO CAPS
200.0000 mg | ORAL_CAPSULE | Freq: Every day | ORAL | Status: DC
  Administered 2015-12-19 – 2015-12-20 (×2): 200 mg via ORAL
  Filled 2015-12-17 (×2): qty 2

## 2015-12-17 MED ORDER — FENTANYL CITRATE (PF) 100 MCG/2ML IJ SOLN
50.0000 ug | Freq: Once | INTRAMUSCULAR | Status: AC
  Administered 2015-12-17: 50 ug via INTRAVENOUS

## 2015-12-17 MED ORDER — CALCIUM CHLORIDE 10 % IV SOLN
1.0000 g | Freq: Once | INTRAVENOUS | Status: AC
  Administered 2015-12-17: 1 g via INTRAVENOUS

## 2015-12-17 MED ORDER — CHLORHEXIDINE GLUCONATE 0.12 % MT SOLN
15.0000 mL | OROMUCOSAL | Status: AC
  Administered 2015-12-17: 15 mL via OROMUCOSAL

## 2015-12-17 MED FILL — Potassium Chloride Inj 2 mEq/ML: INTRAVENOUS | Qty: 40 | Status: AC

## 2015-12-17 MED FILL — Heparin Sodium (Porcine) Inj 1000 Unit/ML: INTRAMUSCULAR | Qty: 30 | Status: AC

## 2015-12-17 MED FILL — Magnesium Sulfate Inj 50%: INTRAMUSCULAR | Qty: 10 | Status: AC

## 2015-12-17 SURGICAL SUPPLY — 153 items
ADAPTER CARDIO PERF ANTE/RETRO (ADAPTER) ×3 IMPLANT
APPLICATOR COTTON TIP 6IN STRL (MISCELLANEOUS) IMPLANT
ARTICLIP LAA PROCLIP II 40 (Clip) ×3 IMPLANT
BAG DECANTER FOR FLEXI CONT (MISCELLANEOUS) ×9 IMPLANT
BANDAGE ELASTIC 4 VELCRO ST LF (GAUZE/BANDAGES/DRESSINGS) ×3 IMPLANT
BANDAGE ELASTIC 6 VELCRO ST LF (GAUZE/BANDAGES/DRESSINGS) ×3 IMPLANT
BASKET HEART (ORDER IN 25'S) (MISCELLANEOUS) ×1
BASKET HEART (ORDER IN 25S) (MISCELLANEOUS) ×2 IMPLANT
BENZOIN TINCTURE PRP APPL 2/3 (GAUZE/BANDAGES/DRESSINGS) ×3 IMPLANT
BLADE STERNUM SYSTEM 6 (BLADE) ×6 IMPLANT
BLADE SURG 11 STRL SS (BLADE) ×3 IMPLANT
BLADE SURG ROTATE 9660 (MISCELLANEOUS) IMPLANT
BNDG GAUZE ELAST 4 BULKY (GAUZE/BANDAGES/DRESSINGS) ×3 IMPLANT
CANISTER SUCTION 2500CC (MISCELLANEOUS) ×9 IMPLANT
CANN PRFSN 3/8X14X24FR PCFC (MISCELLANEOUS)
CANN PRFSN 3/8XCNCT ST RT ANG (MISCELLANEOUS)
CANNULA EZ GLIDE AORTIC 21FR (CANNULA) ×12 IMPLANT
CANNULA FEM VENOUS REMOTE 22FR (CANNULA) IMPLANT
CANNULA GUNDRY RCSP 15FR (MISCELLANEOUS) IMPLANT
CANNULA OPTISITE PERFUSION 16F (CANNULA) IMPLANT
CANNULA OPTISITE PERFUSION 18F (CANNULA) IMPLANT
CANNULA PRFSN 3/8X14X24FR PCFC (MISCELLANEOUS) IMPLANT
CANNULA PRFSN 3/8XCNCT RT ANG (MISCELLANEOUS) IMPLANT
CANNULA VEN MTL TIP RT (MISCELLANEOUS)
CATH CPB KIT OWEN (MISCELLANEOUS) ×3 IMPLANT
CATH FOLEY 2WAY SLVR  5CC 14FR (CATHETERS)
CATH FOLEY 2WAY SLVR 5CC 14FR (CATHETERS) IMPLANT
CATH THORACIC 28FR RT ANG (CATHETERS) IMPLANT
CATH THORACIC 36FR (CATHETERS) ×3 IMPLANT
CLIP FOGARTY SPRING 6M (CLIP) IMPLANT
CLIP TI MEDIUM 24 (CLIP) IMPLANT
CLIP TI WIDE RED SMALL 24 (CLIP) IMPLANT
CONN 1/2X1/2X1/2  BEN (MISCELLANEOUS) ×1
CONN 1/2X1/2X1/2 BEN (MISCELLANEOUS) ×2 IMPLANT
CONN 3/8X1/2 ST GISH (MISCELLANEOUS) ×6 IMPLANT
COVER MAYO STAND STRL (DRAPES) ×6 IMPLANT
COVER SURGICAL LIGHT HANDLE (MISCELLANEOUS) ×9 IMPLANT
CRADLE DONUT ADULT HEAD (MISCELLANEOUS) ×6 IMPLANT
DERMABOND ADVANCED (GAUZE/BANDAGES/DRESSINGS) ×2
DERMABOND ADVANCED .7 DNX12 (GAUZE/BANDAGES/DRESSINGS) ×4 IMPLANT
DEVICE ATRICLIP LAA PRCLPII 40 (Clip) ×2 IMPLANT
DEVICE TROCAR PUNCTURE CLOSURE (ENDOMECHANICALS) ×3 IMPLANT
DISSECTOR BLUNT TIP ENDO 5MM (MISCELLANEOUS) ×3 IMPLANT
DRAIN CHANNEL 28F RND 3/8 FF (WOUND CARE) ×6 IMPLANT
DRAIN CHANNEL 32F RND 10.7 FF (WOUND CARE) ×6 IMPLANT
DRAPE BILATERAL SPLIT (DRAPES) ×3 IMPLANT
DRAPE C-ARM 42X72 X-RAY (DRAPES) ×3 IMPLANT
DRAPE CARDIOVASCULAR INCISE (DRAPES) ×1
DRAPE CV SPLIT W-CLR ANES SCRN (DRAPES) ×3 IMPLANT
DRAPE INCISE IOBAN 66X45 STRL (DRAPES) ×9 IMPLANT
DRAPE SLUSH/WARMER DISC (DRAPES) ×3 IMPLANT
DRAPE SRG 135X102X78XABS (DRAPES) ×2 IMPLANT
DRSG COVADERM 4X14 (GAUZE/BANDAGES/DRESSINGS) ×6 IMPLANT
DRSG COVADERM 4X8 (GAUZE/BANDAGES/DRESSINGS) ×3 IMPLANT
ELECT BLADE 4.0 EZ CLEAN MEGAD (MISCELLANEOUS) ×3
ELECT BLADE 6.5 EXT (BLADE) ×3 IMPLANT
ELECT REM PT RETURN 9FT ADLT (ELECTROSURGICAL) ×12
ELECTRODE BLDE 4.0 EZ CLN MEGD (MISCELLANEOUS) ×2 IMPLANT
ELECTRODE REM PT RTRN 9FT ADLT (ELECTROSURGICAL) ×8 IMPLANT
FELT TEFLON 1X6 (MISCELLANEOUS) ×6 IMPLANT
GAUZE SPONGE 4X4 12PLY STRL (GAUZE/BANDAGES/DRESSINGS) ×12 IMPLANT
GLOVE BIO SURGEON STRL SZ 6 (GLOVE) IMPLANT
GLOVE BIO SURGEON STRL SZ 6.5 (GLOVE) IMPLANT
GLOVE BIO SURGEON STRL SZ7 (GLOVE) IMPLANT
GLOVE BIO SURGEON STRL SZ7.5 (GLOVE) IMPLANT
GLOVE ORTHO TXT STRL SZ7.5 (GLOVE) ×9 IMPLANT
GOWN STRL REUS W/ TWL LRG LVL3 (GOWN DISPOSABLE) ×16 IMPLANT
GOWN STRL REUS W/TWL LRG LVL3 (GOWN DISPOSABLE) ×8
HEMOSTAT POWDER SURGIFOAM 1G (HEMOSTASIS) ×15 IMPLANT
INSERT FOGARTY XLG (MISCELLANEOUS) ×6 IMPLANT
KIT BASIN OR (CUSTOM PROCEDURE TRAY) ×6 IMPLANT
KIT DILATOR VASC 18G NDL (KITS) ×3 IMPLANT
KIT ROOM TURNOVER OR (KITS) ×6 IMPLANT
KIT SUCTION CATH 14FR (SUCTIONS) ×18 IMPLANT
KIT VASOVIEW 6 PRO VH 2400 (KITS) ×3 IMPLANT
LEAD PACING MYOCARDI (MISCELLANEOUS) ×3 IMPLANT
LINE VENT (MISCELLANEOUS) ×3 IMPLANT
LOOP VESSEL SUPERMAXI WHITE (MISCELLANEOUS) ×3 IMPLANT
MARKER GRAFT CORONARY BYPASS (MISCELLANEOUS) ×9 IMPLANT
NS IRRIG 1000ML POUR BTL (IV SOLUTION) ×27 IMPLANT
PACK OPEN HEART (CUSTOM PROCEDURE TRAY) ×6 IMPLANT
PAD ARMBOARD 7.5X6 YLW CONV (MISCELLANEOUS) ×12 IMPLANT
PAD ELECT DEFIB RADIOL ZOLL (MISCELLANEOUS) ×3 IMPLANT
PENCIL BUTTON HOLSTER BLD 10FT (ELECTRODE) ×3 IMPLANT
PUNCH AORTIC ROTATE 4.0MM (MISCELLANEOUS) IMPLANT
PUNCH AORTIC ROTATE 4.5MM 8IN (MISCELLANEOUS) IMPLANT
PUNCH AORTIC ROTATE 5MM 8IN (MISCELLANEOUS) IMPLANT
RETRACTOR TRL SOFT TISSUE LG (INSTRUMENTS) IMPLANT
RETRACTOR TRM SOFT TISSUE 7.5 (INSTRUMENTS) IMPLANT
SCISSORS LAP 5X35 DISP (ENDOMECHANICALS) ×3 IMPLANT
SET CARDIOPLEGIA MPS 5001102 (MISCELLANEOUS) ×3 IMPLANT
SET IRRIG TUBING LAPAROSCOPIC (IRRIGATION / IRRIGATOR) ×3 IMPLANT
SOLUTION ANTI FOG 6CC (MISCELLANEOUS) ×3 IMPLANT
SPONGE LAP 18X18 X RAY DECT (DISPOSABLE) ×3 IMPLANT
SPONGE LAP 4X18 X RAY DECT (DISPOSABLE) IMPLANT
SUCKER INTRACARDIAC WEIGHTED (SUCKER) ×3 IMPLANT
SUT BONE WAX W31G (SUTURE) ×3 IMPLANT
SUT ETHIBOND 2 0 SH (SUTURE) ×2 IMPLANT
SUT ETHIBOND 2 0 SH 36X2 (SUTURE) ×4 IMPLANT
SUT ETHIBOND 2 0 V4 (SUTURE) IMPLANT
SUT ETHIBOND 2 0V4 GREEN (SUTURE) IMPLANT
SUT ETHIBOND 4 0 TF (SUTURE) IMPLANT
SUT ETHIBOND 5 0 C 1 30 (SUTURE) ×3 IMPLANT
SUT ETHIBOND X763 2 0 SH 1 (SUTURE) ×9 IMPLANT
SUT MNCRL AB 3-0 PS2 18 (SUTURE) ×6 IMPLANT
SUT MNCRL AB 4-0 PS2 18 (SUTURE) ×6 IMPLANT
SUT PDS AB 1 CTX 36 (SUTURE) ×6 IMPLANT
SUT PROLENE 2 0 SH DA (SUTURE) IMPLANT
SUT PROLENE 3 0 SH 1 (SUTURE) ×6 IMPLANT
SUT PROLENE 3 0 SH DA (SUTURE) ×6 IMPLANT
SUT PROLENE 3 0 SH1 36 (SUTURE) ×6 IMPLANT
SUT PROLENE 4 0 RB 1 (SUTURE) ×4
SUT PROLENE 4 0 SH DA (SUTURE) ×9 IMPLANT
SUT PROLENE 4-0 RB1 .5 CRCL 36 (SUTURE) ×8 IMPLANT
SUT PROLENE 5 0 C 1 36 (SUTURE) IMPLANT
SUT PROLENE 6 0 C 1 30 (SUTURE) IMPLANT
SUT PROLENE 7.0 RB 3 (SUTURE) ×30 IMPLANT
SUT PROLENE 8 0 BV175 6 (SUTURE) ×3 IMPLANT
SUT PROLENE BLUE 7 0 (SUTURE) ×6 IMPLANT
SUT PROLENE POLY MONO (SUTURE) ×15 IMPLANT
SUT SILK  1 MH (SUTURE) ×4
SUT SILK 1 MH (SUTURE) ×8 IMPLANT
SUT SILK 2 0 TIES 10X30 (SUTURE) ×3 IMPLANT
SUT SILK 2 0SH CR/8 30 (SUTURE) ×3 IMPLANT
SUT SILK 3 0SH CR/8 30 (SUTURE) ×3 IMPLANT
SUT STEEL 6MS V (SUTURE) IMPLANT
SUT STEEL STERNAL CCS#1 18IN (SUTURE) IMPLANT
SUT STEEL SZ 6 DBL 3X14 BALL (SUTURE) IMPLANT
SUT VIC AB 1 CTX 36 (SUTURE)
SUT VIC AB 1 CTX36XBRD ANBCTR (SUTURE) IMPLANT
SUT VIC AB 2-0 CT1 27 (SUTURE)
SUT VIC AB 2-0 CT1 TAPERPNT 27 (SUTURE) IMPLANT
SUT VIC AB 2-0 CTX 27 (SUTURE) ×6 IMPLANT
SUT VIC AB 2-0 UR6 27 (SUTURE) ×6 IMPLANT
SUT VIC AB 3-0 SH 27 (SUTURE)
SUT VIC AB 3-0 SH 27X BRD (SUTURE) IMPLANT
SUT VIC AB 3-0 SH 8-18 (SUTURE) ×6 IMPLANT
SUT VIC AB 3-0 X1 27 (SUTURE) IMPLANT
SUT VICRYL 2 TP 1 (SUTURE) ×3 IMPLANT
SUT VICRYL 4-0 PS2 18IN ABS (SUTURE) IMPLANT
SUTURE E-PAK OPEN HEART (SUTURE) ×3 IMPLANT
SYRINGE 10CC LL (SYRINGE) ×3 IMPLANT
SYSTEM SAHARA CHEST DRAIN ATS (WOUND CARE) ×9 IMPLANT
TOWEL OR 17X24 6PK STRL BLUE (TOWEL DISPOSABLE) ×9 IMPLANT
TOWEL OR 17X26 10 PK STRL BLUE (TOWEL DISPOSABLE) ×9 IMPLANT
TRAY FOLEY IC TEMP SENS 14FR (CATHETERS) ×3 IMPLANT
TRAY FOLEY IC TEMP SENS 16FR (CATHETERS) ×6 IMPLANT
TROCAR XCEL BLADELESS 5X75MML (TROCAR) ×6 IMPLANT
TUBING INSUFFLATION (TUBING) ×3 IMPLANT
TUBING INSUFFLATION 10FT LAP (TUBING) ×3 IMPLANT
TUNNELER SHEATH ON-Q 11GX8 DSP (PAIN MANAGEMENT) IMPLANT
UNDERPAD 30X30 INCONTINENT (UNDERPADS AND DIAPERS) ×6 IMPLANT
WATER STERILE IRR 1000ML POUR (IV SOLUTION) ×12 IMPLANT

## 2015-12-17 NOTE — Op Note (Signed)
CARDIOTHORACIC SURGERY OPERATIVE NOTE  Date of Procedure: 12/17/2015  Preoperative Diagnosis:   Severe 3-vessel Coronary Artery Disease  Unstable Angina  Acute on Chronic Systolic Congestive Heart Failure  Ischemic Cardiomyopathy  New-onset Paroxysmal Atrial Fibrillation  Postoperative Diagnosis: Same  Procedure:    Coronary Artery Bypass Grafting x 3   Left Internal Mammary Artery to Distal Left Anterior Descending Coronary Artery  Saphenous Vein Graft to Posterior Descending Coronary Artery  Saphenous Vein Graft to Obtuse Marginal Branch of Left Circumflex Coronary Artery  Endoscopic Vein Harvest from Right Thigh   Clipping of Left Atrial Appendage  Surgeon: Valentina Gu. Roxy Manns, MD  Assistant: John Giovanni, PA-C and Ellwood Handler, PA-C  Anesthesia: Jillyn Hidden, MD and Lorrene Reid, MD  Operative Findings:  Severe LV systolic dysfunction with EF estimated 20-30%  Ischemic cardiomyopathy with transmural scar in inferior and distal anterior wall  Mild central mitral regurgitation  Good quality LIMA conduit for grafting  Good quality SVG conduit for grafting  Diffuse CAD with fair to poor target vessels for grafting    BRIEF CLINICAL NOTE AND INDICATIONS FOR SURGERY  Patient is a 55 year old male with no previous history of coronary artery disease but risk factors notable for long-standing tobacco abuse and hypercholesterolemia who has been referred for surgical consultation to discuss treatment options for management of severe multivessel coronary artery disease with unstable angina, recent non-ST segment elevation myocardial infarction, new onset paroxysmal atrial fibrillation and acute systolic congestive heart failure. The patient states that he has been physically active and healthy for all of his life. He admits to long-standing tobacco abuse. Approximately 4-6 weeks ago he began to experience intermittent dull pain in the right flank. Symptoms waxed and waned  for a period of time but he ultimately presented to his primary care physician. He had routine lab work and underwent abdominal ultrasound because of suspicion of possible gallbladder disease. Abdominal ultrasound was reportedly normal. He was treated for presumed GE reflux disease. Approximately 6 days ago the patient was awoke from his sleep with dull midepigastric and substernal chest pain associated with shortness of breath. Symptoms persisted for several hours and then resolved. 2 days later symptoms recurred. He presented to his primary care physician's office and was noted to be in rapid atrial fibrillation. He was sent directly to the emergency department. Troponin levels were elevated. He was treated with intravenous diltiazem and converted to sinus rhythm. Symptoms of chest discomfort and shortness of breath improved. Transthoracic echocardiogram was performed demonstrating severe left ventricular systolic dysfunction with ejection fraction estimated 20-25%. There was moderate mitral regurgitation and severe left atrial enlargement. There were findings suggestive of moderate to severe pulmonary hypertension. The patient was treated with diuretics and shortness of breath improved. He diuresed more than 8 pounds over 48 hours. Left and right heart catheterization was performed 12/15/2015. Patient was found to have moderate to severe left ventricular systolic dysfunction with 100% chronic occlusion of the mid left anterior descending coronary artery, 100% ostial occlusion of the right coronary artery, 70% stenosis of left main coronary artery and 70% proximal stenosis of the left circumflex coronary artery. There are left to right collaterals filling the right coronary circulation. There was mild pulmonary hypertension with moderately elevated filling pressures. Cardiothoracic surgical consultation was requested.  The patient has been seen in consultation and counseled at length regarding the  indications, risks and potential benefits of surgery.  All questions have been answered, and the patient provides full informed consent for the operation  as described.    DETAILS OF THE OPERATIVE PROCEDURE  Preparation:  The patient is brought to the operating room on the above mentioned date and central monitoring was established by the anesthesia team including placement of Swan-Ganz catheter and radial arterial line. The patient is placed in the supine position on the operating table.  Intravenous antibiotics are administered. General endotracheal anesthesia is induced uneventfully. A Foley catheter is placed.  Baseline transesophageal echocardiogram was performed.  Findings were notable for severe LV systolic dysfunction with ejection fraction estimated 20-30%.  There was mild central mitral regurgitation.  The patient's chest, abdomen, both groins, and both lower extremities are prepared and draped in a sterile manner. A time out procedure is performed.   Surgical Approach and Conduit Harvest:  A median sternotomy incision was performed and the left internal mammary artery is dissected from the chest wall and prepared for bypass grafting. The left internal mammary artery is notably good quality conduit. Simultaneously, the greater saphenous vein is obtained from the patient's right thihg using endoscopic vein harvest technique. The saphenous vein is notably good quality conduit. After removal of the saphenous vein, the small surgical incisions in the lower extremity are closed with absorbable suture. Following systemic heparinization, the left internal mammary artery was transected distally noted to have excellent flow.   Extracorporeal Cardiopulmonary Bypass and Myocardial Protection:  The pericardium is opened. The ascending aorta is normal in appearance. The ascending aorta and the right atrium are cannulated for cardiopulmonary bypass.  Adequate heparinization is verified.   A  retrograde cardioplegia cannula is placed through the right atrium into the coronary sinus.  The entire pre-bypass portion of the operation was notable for stable hemodynamics.  Cardiopulmonary bypass was begun and the surface of the heart is inspected. Distal target vessels are selected for coronary artery bypass grafting. A cardioplegia cannula is placed in the ascending aorta.  A temperature probe was placed in the interventricular septum.  The patient is allowed to cool passively to [facility] systemic temperature.  The aortic cross clamp is applied and cold blood cardioplegia is delivered initially in an antegrade fashion through the aortic root.  Supplemental cardioplegia is given retrograde through the coronary sinus catheter.  Iced saline slush is applied for topical hypothermia.  The initial cardioplegic arrest is rapid with early diastolic arrest.  Repeat doses of cardioplegia are administered intermittently throughout the entire cross clamp portion of the operation through the aortic root, through the coronary sinus catheter, and through subsequently placed vein grafts in order to maintain completely flat electrocardiogram and septal myocardial temperature below 15C.  Myocardial protection was felt to be excellent.   Clipping of Left Atrial Appendage:  The left atrial appendage was obliterated using an Atricure Pro2 epicardial clip, size 40 mm.    Coronary Artery Bypass Grafting:   The posterior descending branch of the right coronary artery was grafted using a reversed saphenous vein graft in an end-to-side fashion.  At the site of distal anastomosis the target vessel was poor quality and measured approximately 1.5 mm in diameter.  The obtuse marginal branch of the left circumflex coronary artery was grafted using a reversed saphenous vein graft in an end-to-side fashion.  At the site of distal anastomosis the target vessel was good quality and measured approximately 2.0 mm in  diameter.  The distal left anterior coronary artery was grafted with the left internal mammary artery in an end-to-side fashion.  At the site of distal anastomosis the target vessel was poor  quality and measured approximately 1.0 mm in diameter.  All proximal vein graft anastomoses were placed directly to the ascending aorta prior to removal of the aortic cross clamp.  The septal myocardial temperature rose rapidly after reperfusion of the left internal mammary artery graft.  The aortic cross clamp was removed after a total cross clamp time of 86 minutes.   Procedure Completion:  All proximal and distal coronary anastomoses were inspected for hemostasis and appropriate graft orientation. Epicardial pacing wires are fixed to the right ventricular outflow tract and to the right atrial appendage. The patient is rewarmed to 37C temperature. The patient is weaned and disconnected from cardiopulmonary bypass.  The patient's rhythm at separation from bypass was sinus.  The patient was weaned from cardiopulmonary bypass on low dose milrinone and dopamine infusions. Total cardiopulmonary bypass time for the operation was 112 minutes.  Followup transesophageal echocardiogram performed after separation from bypass revealed no changes from the preoperative exam.  The aortic and venous cannula were removed uneventfully. Protamine was administered to reverse the anticoagulation. The mediastinum and pleural space were inspected for hemostasis and irrigated with saline solution. The mediastinum and the left pleural space were drained using 3 chest tubes placed through separate stab incisions inferiorly.  The soft tissues anterior to the aorta were reapproximated loosely. The sternum is closed with double strength sternal wire. The soft tissues anterior to the sternum were closed in multiple layers and the skin is closed with a running subcuticular skin closure.  The post-bypass portion of the operation was notable  for stable rhythm and hemodynamics.  No blood products were administered during the operation.   Disposition:  The patient tolerated the procedure well and is transported to the surgical intensive care in stable condition. There are no intraoperative complications. All sponge instrument and needle counts are verified correct at completion of the operation.    Valentina Gu. Roxy Manns MD 12/17/2015 6:25 PM

## 2015-12-17 NOTE — Care Management Note (Signed)
Case Management Note  Patient Details  Name: [name] [name] MRN: [id number] Date of Birth: 01/08/1961  Subjective/Objective:    Pt admitted for A fib/ Chest Pain. Pt is from home with support of wife. Plan for CABG 12-17-15.                 Action/Plan: CM will continue to monitor for additional disposition needs.    Expected Discharge Date:                  Expected Discharge Plan:  [place]  In-House Referral:     Discharge planning Services  CM Consult  Post Acute Care Choice:    Choice offered to:     DME Arranged:    DME Agency:     HH Arranged:    [place] Agency:     Status of Service:  In process, will continue to follow  Medicare Important Message Given:    Date Medicare IM Given:    Medicare IM give by:    Date Additional Medicare IM Given:    Additional Medicare Important Message give by:     If discussed at [place] of Stay Meetings, dates discussed:    Additional Comments:  Bethena Roys, RN 12/17/2015, 9:31 AM

## 2015-12-17 NOTE — Progress Notes (Signed)
  Echocardiogram Echocardiogram Transesophageal has been performed.  [name] [name] M 12/17/2015, 3:06 PM

## 2015-12-17 NOTE — Transfer of Care (Signed)
Immediate Anesthesia Transfer of Care Note  Patient: [name] [name]  Procedure(s) Performed: Procedure(s): CORONARY ARTERY BYPASS GRAFTING (CABG) x three , using left internal mammary artery and right leg greater saphenous vein (N/A) CLIPPING OF ATRIAL APPENDAGE (Left)  Patient Location: SICU  Anesthesia Type:General  Level of Consciousness: sedated and Patient remains intubated per anesthesia plan  Airway & Oxygen Therapy: Patient remains intubated per anesthesia plan and Patient placed on Ventilator (see vital sign flow sheet for setting)  Post-op Assessment: Report given to RN and Post -op Vital signs reviewed and stable  Post vital signs: Reviewed and stable  Last Vitals:  Filed Vitals:   12/17/15 0504 12/17/15 0907  BP: 124/77 134/78  Pulse: 86 81  Temp: 36.5 C   Resp:      Last Pain:  Filed Vitals:   12/17/15 0908  PainSc: 0-No pain      Patients Stated Pain Goal: 0 ([id number])  Complications: No apparent anesthesia complications

## 2015-12-17 NOTE — Anesthesia Postprocedure Evaluation (Signed)
Anesthesia Post Note  Patient: [name] [name]  Procedure(s) Performed: Procedure(s) (LRB): CORONARY ARTERY BYPASS GRAFTING (CABG) x three , using left internal mammary artery and right leg greater saphenous vein (N/A) CLIPPING OF ATRIAL APPENDAGE (Left)  Patient location during evaluation: SICU Anesthesia Type: General Level of consciousness: sedated Pain management: pain level controlled Vital Signs Assessment: post-procedure vital signs reviewed and stable Respiratory status: patient remains intubated per anesthesia plan and patient on ventilator - see flowsheet for VS Cardiovascular status: blood pressure returned to baseline Postop Assessment: no signs of nausea or vomiting Anesthetic complications: no    Last Vitals:  Filed Vitals:   12/17/15 1930 12/17/15 1945  BP: 77/51 101/62  Pulse: 89 90  Temp: 36.7 C 36.7 C  Resp: 18 19    Last Pain:  Filed Vitals:   12/17/15 1950  PainSc: 0-No pain                 Apurva Reily A

## 2015-12-17 NOTE — Anesthesia Preprocedure Evaluation (Addendum)
Anesthesia Evaluation  Patient identified by MRN, date of birth, ID band Patient awake    Reviewed: Allergy & Precautions, H&P , NPO status , Patient's Chart, lab work & pertinent test results  History of Anesthesia Complications Negative for: history of anesthetic complications  Airway Mallampati: II  TM Distance: >3 FB Neck ROM: full    Dental no notable dental hx.    Pulmonary Current Smoker,    Pulmonary exam normal breath sounds clear to auscultation       Cardiovascular + angina + CAD, + Past MI and +CHF  Normal cardiovascular exam+ dysrhythmias Atrial Fibrillation  Rhythm:regular Rate:Normal  Severe CAD including left main disease, 3 vessels Ischemic cardiomyopathy with EF 20-25% Possibly moderate functional MR from dilated annulus? And ischemic cardiomyopathy   Neuro/Psych negative neurological ROS     GI/Hepatic Neg liver ROS, GERD  ,  Endo/Other  negative endocrine ROS  Renal/GU negative Renal ROS     Musculoskeletal   Abdominal   Peds  Hematology negative hematology ROS (+)   Anesthesia Other Findings   Reproductive/Obstetrics negative OB ROS                            Anesthesia Physical Anesthesia Plan  ASA: IV  Anesthesia Plan: General   Post-op Pain Management:    Induction: Intravenous  Airway Management Planned: Oral ETT  Additional Equipment: Arterial line, CVP, 3D TEE, TEE, Ultrasound Guidance Line Placement and PA Cath  Intra-op Plan:   Post-operative Plan: Post-operative intubation/ventilation  Informed Consent: I have reviewed the patients History and Physical, chart, labs and discussed the procedure including the risks, benefits and alternatives for the proposed anesthesia with the patient or authorized representative who has indicated his/her understanding and acceptance.   Dental Advisory Given  Plan Discussed with: Anesthesiologist, CRNA and  Surgeon  Anesthesia Plan Comments:         Anesthesia Quick Evaluation

## 2015-12-17 NOTE — Consult Note (Signed)
Consult Note  Patient name: [name] [name] MRN: [id number] DOB: May 26, 1961 Sex: male  Consulting Physician:  Dr. Roxy Manns  Reason for Consult:  Chief Complaint  Patient presents with  . Abnormal ECG    HISTORY OF PRESENT ILLNESS: This is a 55 year old maleWho presented to the hospital on 12/13/2015 with increased heart rate and abnormal EKG.  He states that he has been having right flank pain for several weeks which has become progressively more towards the epigastrium.  He also began complaining of shortness of breath, leg swelling, orthopnea and intermittent chest pain.  His troponins were elevated.  He underwent cardiac catheterization which revealed severe multivessel disease.  His ejection fraction was approximately 20-25 percent.  He did have significant fluid weight gain which has improved with diuresis.  His symptoms have improved.  He has been scheduled for coronary bypass.  During his pre-CABG workup he was found to have a high-grade left carotid stenosis on ultrasound.  The patient is asymptomatic.  Specifically, he denies numbness or weakness in either extremity.  He denies slurred speech.  He denies amaurosis fugax.  The patient suffers from long-standing tobacco abuse.  He is now on a statin for hypercholesterolemia. He did go into A. fib with rapid ventricular response which converted with diltiazem back into sinus rhythm.  Past Medical History  Diagnosis Date  . GERD (gastroesophageal reflux disease)   . Tobacco abuse   . Atrial fibrillation with rapid ventricular response ([place]) 12/13/2015  . Cardiomyopathy ([place]) 12/13/2015  . NSTEMI (non-ST elevated myocardial infarction) ([place])   . Cardiomyopathy, ischemic 12/14/2015    EF 20-25%  . Mitral regurgitation 12/14/2015    moderate  . Acute systolic congestive heart failure ([place])   . Coronary artery disease involving native coronary artery with unstable angina pectoris ([place]) 12/15/2015  . Hypercholesterolemia 12/15/2015     Past Surgical History  Procedure Laterality Date  . Cardiac catheterization N/A 12/15/2015    Procedure: Right/Left Heart Cath and Coronary Angiography;  Surgeon: Wellington Hampshire, MD;  Location: [facility];  Service: Cardiovascular;  Laterality: N/A;  . Knee arthroscopy Bilateral     Social History   Social History  . Marital Status: Married    Spouse Name: N/A  . Number of Children: N/A  . Years of Education: N/A   Occupational History  . Maintanence [employer]   Social History Main Topics  . Smoking status: Current Some Day Smoker -- 1.00 packs/day    Types: Cigarettes  . Smokeless tobacco: Not on file  . Alcohol Use: No  . Drug Use: No  . Sexual Activity: Not on file   Other Topics Concern  . Not on file   Social History Narrative    Family History  Problem Relation Age of Onset  . Heart attack Paternal Grandfather   . Heart failure Father   . Atrial fibrillation Father     Allergies as of 12/13/2015  . (No Known Allergies)    No current facility-administered medications on file prior to encounter.   Current Outpatient Prescriptions on File Prior to Encounter  Medication Sig Dispense Refill  . pantoprazole (PROTONIX) 40 MG tablet Take 1 tablet (40 mg total) by mouth daily. 30 tablet 0  . HYDROcodone-acetaminophen (NORCO/VICODIN) 5-325 MG tablet Take 1 tablet by mouth every 6 (six) hours as needed for moderate pain. (Patient not taking: Reported on 12/13/2015) 24 tablet 0  .  mupirocin ointment (BACTROBAN) 2 % PLACE 1 APPLICATION INTO THE NOSE 2 TIMES DAILY. (Patient not taking: Reported on 12/13/2015) 22 g 5  . ondansetron (ZOFRAN ODT) 4 MG disintegrating tablet Take 1 tablet (4 mg total) by mouth every 8 (eight) hours as needed for nausea or vomiting. (Patient not taking: Reported on 12/13/2015) 15 tablet 0  . sucralfate (CARAFATE) 1 g tablet One tablet ac and hs-mix in 2 oz water (Patient not taking: Reported on 12/13/2015) 60 tablet 0      REVIEW OF SYSTEMS: Cardiovascular: Positive chest pain, shortness of breath, orthopnea, lower extremity swelling Pulmonary: Positive shortness of breath Neurologic: No weakness, paresthesias, aphasia, or amaurosis. No dizziness. Hematologic: No bleeding problems or clotting disorders. Musculoskeletal: No joint pain or joint swelling. Gastrointestinal: Right flank pain and epigastric pain Genitourinary: No dysuria or hematuria. Psychiatric:: No history of major depression. Integumentary: No rashes or ulcers. Constitutional: No fever or chills.  PHYSICAL EXAMINATION: General: The patient appears their stated age.  Vital signs are BP 134/78 mmHg  Pulse 81  Temp(Src) 97.7 F (36.5 C) (Oral)  Resp 18  Ht 6' (1.829 m)  Wt 213 lb 1.6 oz (96.662 kg)  BMI 28.90 kg/m2  SpO2 97% Pulmonary: Respirations are non-labored HEENT:  No gross abnormalities Musculoskeletal: There are no major deformities.   Neurologic: No focal weakness or paresthesias are detected, Skin: There are no ulcer or rashes noted. Psychiatric: The patient has normal affect. Cardiovascular: There is a regular rate and rhythm without significant murmur appreciated.  Diagnostic Studies: I have reviewed his vascular lab ultrasound studies which show greater than 80% left carotid stenosis and 1-30 absent right carotid stenosis    Assessment:  Asymptomatic left carotid stenosis Coronary artery disease Plan: I discussed with the patient and his family that in this setting, he is symptomatic from a cardiac perspective but not from a cerebrovascular perspective, therefore I would recommend proceeding with CABG today and then plan for elective left carotid endarterectomy once he has recovered from his heart surgery.  This was communicated with the patient and he agrees.  I'll see him back in the office in approximate 6 weeks for discussions of left carotid endarterectomy.  This was discussed with Dr.Owen.     Eldridge Abrahams, M.D. [facility] Office: [phone] Pager:  [phone]

## 2015-12-17 NOTE — Anesthesia Procedure Notes (Addendum)
Procedure Name: Intubation Date/Time: 12/17/2015 2:20 PM Performed by: Maude Leriche D Pre-anesthesia Checklist: Patient identified, Emergency Drugs available, Suction available, Patient being monitored and Timeout performed Patient Re-evaluated:Patient Re-evaluated prior to inductionOxygen Delivery Method: Circle system utilized Preoxygenation: Pre-oxygenation with 100% oxygen Intubation Type: IV induction Ventilation: Mask ventilation without difficulty and Oral airway inserted - appropriate to patient size Laryngoscope Size: Miller and 2 Grade View: Grade I Tube type: Oral Tube size: 8.0 mm Number of attempts: 1 Airway Equipment and Method: Stylet Placement Confirmation: ETT inserted through vocal cords under direct vision,  positive ETCO2 and breath sounds checked- equal and bilateral Secured at: 23 cm Tube secured with: Tape Dental Injury: Teeth and Oropharynx as per pre-operative assessment     Central Venous Catheter Insertion Performed by: anesthesiologist Patient location: Pre-op. Preanesthetic checklist: patient identified, IV checked, site marked, risks and benefits discussed, surgical consent, monitors and equipment checked, pre-op evaluation, timeout performed and anesthesia consent Lidocaine 1% used for infiltration Landmarks identified Catheter size: 8.5 Fr Sheath introducer Swan type and PA catheter depth:thermodilutionProcedure performed using ultrasound guided technique. Attempts: 1 Following insertion, line sutured, dressing applied and Biopatch. Post procedure assessment: blood return through all ports, free fluid flow and no air. Patient tolerated the procedure well with no immediate complications.    Central Venous Catheter Insertion Performed by: anesthesiologist Patient location: Pre-op. Preanesthetic checklist: patient identified, IV checked, site marked, risks and benefits discussed, surgical consent, monitors and equipment checked, pre-op  evaluation, timeout performed and anesthesia consent Landmarks identified PA cath was placed.Swan type and PA catheter depth:thermodilation and 45PA Cath depth:45 Procedure performed using ultrasound guided technique. Attempts: 1 Patient tolerated the procedure well with no immediate complications.

## 2015-12-17 NOTE — Brief Op Note (Addendum)
12/13/2015 - 6/macolog16/2017      [address]       [place] [address]             [phone]     12/13/2015 - 12/17/2015  3:50 PM  PATIENT:  [name] [name]  55 y.o. male  PRE-OPERATIVE DIAGNOSIS:  coronary artery disease  POST-OPERATIVE DIAGNOSIS:  coronary artery disease  PROCEDURE:  Procedure(s):  CORONARY ARTERY BYPASS GRAFTING x 3 -LIMA to LAD -SVG to OM -SVG to PDA   CLIPPING OF ATRIAL APPENDAGE -40 mm AtriCure Pro Clip 2 ENDOSCOPIC GREATER SAPHENOUS VEIN HARVEST(EVH)  RIGHT THIGH  SURGEON:    [name], MD  ASSISTANTS:  John Giovanni, PA-C and Ellwood Handler, PA-C  ANESTHESIA:   Lorrene Reid, MD and Jillyn Hidden, MD  CROSSCLAMP TIME:   30'  CARDIOPULMONARY BYPASS TIME: 112'  FINDINGS:  Severe LV systolic dysfunction with EF estimated 20-30%  Ischemic cardiomyopathy with transmural scar in inferior and distal anterior wall  Mild central mitral regurgitation  Good quality LIMA conduit for grafting  Good quality SVG conduit for grafting  Diffuse CAD with fair to poor target vessels for grafting  COMPLICATIONS: None  BASELINE WEIGHT: 99 kg  PATIENT DISPOSITION:   TO SICU IN STABLE CONDITION  [name], MD 12/17/2015 6:20 PM

## 2015-12-17 NOTE — Progress Notes (Addendum)
Pre-op Cardiac Surgery  Carotid Findings:  Findings suggest low range 40-59% right internal carotid artery stenosis and 80-99% left internal carotid artery stenosis. Vertebral arteries are patent with antegrade flow.  Preliminary results discussed with Dr. Roxy Manns.  Upper Extremity Right Left  Brachial Pressures 130-Triphasic 134-Triphasic  Radial Waveforms Triphasic Triphasic  Ulnar Waveforms Triphasic Triphasic  Palmar Arch (Allen's Test) Within normal limits Within normal limits.     Lower  Extremity Right Left  Dorsalis Pedis Triphasic Triphasic  Posterior Tibial Triphasic Triphasic   12/17/2015 8:35 AM Maudry Mayhew, RVT, RDCS, RDMS

## 2015-12-18 ENCOUNTER — Inpatient Hospital Stay (HOSPITAL_COMMUNITY): Payer: 59

## 2015-12-18 ENCOUNTER — Telehealth: Payer: Self-pay | Admitting: Surgery

## 2015-12-18 LAB — POCT I-STAT 3, ART BLOOD GAS (G3+)
Acid-base deficit: 1 mmol/L (ref 0.0–2.0)
Acid-base deficit: 1 mmol/L (ref 0.0–2.0)
Bicarbonate: 23.8 mEq/L (ref 20.0–24.0)
Bicarbonate: 22.8 mEq/L (ref 20.0–24.0)
O2 Saturation: 96 %
O2 Saturation: 95 %
Patient temperature: 36.9
Patient temperature: 37.7
TCO2: 25 mmol/L (ref 0–100)
TCO2: 24 mmol/L (ref 0–100)
pCO2 arterial: 40.8 mmHg (ref 35.0–45.0)
pCO2 arterial: 36 mmHg (ref 35.0–45.0)
pH, Arterial: 7.373 (ref 7.350–7.450)
pH, Arterial: 7.412 (ref 7.350–7.450)
pO2, Arterial: 82 mmHg (ref 80.0–100.0)
pO2, Arterial: 78 mmHg — ABNORMAL LOW (ref 80.0–100.0)

## 2015-12-18 LAB — BASIC METABOLIC PANEL
Anion gap: 5 (ref 5–15)
BUN: 8 mg/dL (ref 6–20)
CALCIUM: 8.2 mg/dL — AB (ref 8.9–10.3)
CO2: 23 mmol/L (ref 22–32)
CREATININE: 0.93 mg/dL (ref 0.61–1.24)
Chloride: 107 mmol/L (ref 101–111)
GFR calc Af Amer: >60 mL/min (ref >60)
GFR calc non Af Amer: >60 mL/min (ref >60)
Glucose, Bld: 148 mg/dL — ABNORMAL HIGH (ref 65–99)
Potassium: 4.1 mmol/L (ref 3.5–5.1)
SODIUM: 135 mmol/L (ref 135–145)

## 2015-12-18 LAB — POCT I-STAT, CHEM 8
BUN: 9 mg/dL (ref 6–20)
Calcium, Ion: 1.14 mmol/L (ref 1.12–1.23)
Chloride: 100 mmol/L — ABNORMAL LOW (ref 101–111)
Creatinine, Ser: 1 mg/dL (ref 0.61–1.24)
Glucose, Bld: 125 mg/dL — ABNORMAL HIGH (ref 65–99)
HCT: 38 % — ABNORMAL LOW (ref 39.0–52.0)
Hemoglobin: 12.9 g/dL — ABNORMAL LOW (ref 13.0–17.0)
Potassium: 3.9 mmol/L (ref 3.5–5.1)
Sodium: 136 mmol/L (ref 135–145)
TCO2: 21 mmol/L (ref 0–100)

## 2015-12-18 LAB — CBC
HCT: 35.2 % — ABNORMAL LOW (ref 39.0–52.0)
HCT: 35.4 % — ABNORMAL LOW (ref 39.0–52.0)
HEMOGLOBIN: 11.9 g/dL — AB (ref 13.0–17.0)
HEMOGLOBIN: 11.9 g/dL — AB (ref 13.0–17.0)
MCH: 30 pg (ref 26.0–34.0)
MCH: 29.8 pg (ref 26.0–34.0)
MCHC: 33.8 g/dL (ref 30.0–36.0)
MCHC: 33.6 g/dL (ref 30.0–36.0)
MCV: 88.7 fL (ref 78.0–100.0)
MCV: 88.5 fL (ref 78.0–100.0)
PLATELETS: 235 10*3/uL (ref 150–400)
Platelets: 224 10*3/uL (ref 150–400)
RBC: 3.97 MIL/uL — ABNORMAL LOW (ref 4.22–5.81)
RBC: 4 MIL/uL — ABNORMAL LOW (ref 4.22–5.81)
RDW: 12.9 % (ref 11.5–15.5)
RDW: 12.9 % (ref 11.5–15.5)
WBC: 14.2 10*3/uL — ABNORMAL HIGH (ref 4.0–10.5)
WBC: 14.1 10*3/uL — ABNORMAL HIGH (ref 4.0–10.5)

## 2015-12-18 LAB — GLUCOSE, CAPILLARY
Glucose-Capillary: 104 mg/dL — ABNORMAL HIGH (ref 65–99)
Glucose-Capillary: 107 mg/dL — ABNORMAL HIGH (ref 65–99)
Glucose-Capillary: 108 mg/dL — ABNORMAL HIGH (ref 65–99)
Glucose-Capillary: 108 mg/dL — ABNORMAL HIGH (ref 65–99)
Glucose-Capillary: 115 mg/dL — ABNORMAL HIGH (ref 65–99)
Glucose-Capillary: 116 mg/dL — ABNORMAL HIGH (ref 65–99)
Glucose-Capillary: 116 mg/dL — ABNORMAL HIGH (ref 65–99)
Glucose-Capillary: 120 mg/dL — ABNORMAL HIGH (ref 65–99)
Glucose-Capillary: 123 mg/dL — ABNORMAL HIGH (ref 65–99)
Glucose-Capillary: 127 mg/dL — ABNORMAL HIGH (ref 65–99)
Glucose-Capillary: 127 mg/dL — ABNORMAL HIGH (ref 65–99)
Glucose-Capillary: 151 mg/dL — ABNORMAL HIGH (ref 65–99)
Glucose-Capillary: 129 mg/dL — ABNORMAL HIGH (ref 65–99)
Glucose-Capillary: 133 mg/dL — ABNORMAL HIGH (ref 65–99)
Glucose-Capillary: 115 mg/dL — ABNORMAL HIGH (ref 65–99)
Glucose-Capillary: 123 mg/dL — ABNORMAL HIGH (ref 65–99)

## 2015-12-18 LAB — MAGNESIUM
MAGNESIUM: 2.5 mg/dL — AB (ref 1.7–2.4)
Magnesium: 1.9 mg/dL (ref 1.7–2.4)

## 2015-12-18 LAB — CREATININE, SERUM
CREATININE: 1.02 mg/dL (ref 0.61–1.24)
GFR calc Af Amer: >60 mL/min (ref >60)
GFR calc non Af Amer: >60 mL/min (ref >60)

## 2015-12-18 MED ORDER — INSULIN DETEMIR 100 UNIT/ML ~~LOC~~ SOLN
10.0000 [IU] | Freq: Every day | SUBCUTANEOUS | Status: DC
  Administered 2015-12-18 – 2015-12-19 (×2): 10 [IU] via SUBCUTANEOUS
  Filled 2015-12-18 (×3): qty 0.1

## 2015-12-18 MED ORDER — ALBUMIN HUMAN 5 % IV SOLN
INTRAVENOUS | Status: AC
  Administered 2015-12-18: 12.5 g
  Filled 2015-12-18: qty 250

## 2015-12-18 MED ORDER — AMIODARONE IV BOLUS ONLY 150 MG/100ML: 150.0000 mg | Freq: Once | INTRAVENOUS | Status: DC

## 2015-12-18 MED ORDER — FUROSEMIDE 10 MG/ML IJ SOLN
40.0000 mg | Freq: Once | INTRAMUSCULAR | Status: AC
  Administered 2015-12-18: 40 mg via INTRAVENOUS
  Filled 2015-12-18: qty 4

## 2015-12-18 MED ORDER — INSULIN ASPART 100 UNIT/ML ~~LOC~~ SOLN
0.0000 [IU] | SUBCUTANEOUS | Status: DC
  Administered 2015-12-19: 2 [IU] via SUBCUTANEOUS
  Administered 2015-12-19 (×2): 3 [IU] via SUBCUTANEOUS

## 2015-12-18 NOTE — Procedures (Signed)
Extubation Procedure Note  Patient Details:   Name: [name] [name] DOB: 02/22/1961 MRN: [id number]   Airway Documentation:     Evaluation  O2 sats: stable throughout Complications: No apparent complications Patient did tolerate procedure well. Bilateral Breath Sounds: Clear   Yes   Pt's NIF was -50, VC 2.2 with great pt effort.  ABG within normal limits. Pt extubated per protocol to 4l [place] with sats of 97%.  RT will continue to monitor.  RN at bedside  Pierre Bali 12/18/2015, 12:41 PM

## 2015-12-18 NOTE — Telephone Encounter (Signed)
Sched appt 7/24 at 9. Spoke to pt's wife to inform them of appt. Mailed ppw for them to fill out as per their request.

## 2015-12-18 NOTE — Progress Notes (Addendum)
Patient ID: [name] [name], male   DOB: August 22, 1960, 55 y.o.   MRN: [id number]  SICU Evening Rounds:  Martin Majestic back into atrial fib this afternoon and dropped BP. Responded to albumin.  His is atrial fib 80-90 now. BP 97/50 on Milrinone 0.2, dop 5, neo 45.   amio drip. CI> 2.0  Urine output good.   CT output decreasing, thin.  Will remove swan  Keep chest tubes in today  Wean neo as tolerated.  CBC    Component Value Date/Time   WBC 14.1* 12/18/2015 1700   RBC 4.00* 12/18/2015 1700   HGB 11.9* 12/18/2015 1700   HCT 35.4* 12/18/2015 1700   PLT 224 12/18/2015 1700   MCV 88.5 12/18/2015 1700   MCH 29.8 12/18/2015 1700   MCHC 33.6 12/18/2015 1700   RDW 12.9 12/18/2015 1700   LYMPHSABS 2.7 12/14/2015 0440   MONOABS 0.8 12/14/2015 0440   EOSABS 0.3 12/14/2015 0440   BASOSABS 0.1 12/14/2015 0440    BMET pending this evening.

## 2015-12-18 NOTE — Progress Notes (Signed)
1 Day Post-Op Procedure(s) (LRB): CORONARY ARTERY BYPASS GRAFTING (CABG) x three , using left internal mammary artery and right leg greater saphenous vein (N/A) CLIPPING OF ATRIAL APPENDAGE (Left) Subjective:  Intubated on vent but alert and wants tube out  Objective: Vital signs in last 24 hours: Temp:  [97.9 F (36.6 C)-99 F (37.2 C)] 98.8 F (37.1 C) (06/17 0730) Pulse Rate:  [87-90] 90 (06/17 0900) Cardiac Rhythm:  [-] Atrial paced (06/17 0500) Resp:  [14-20] 18 (06/17 0900) BP: (76-115)/(49-81) 115/65 mmHg (06/17 0900) SpO2:  [92 %-100 %] 100 % (06/17 0900) Arterial Line BP: (71-128)/(43-71) 116/67 mmHg (06/17 0730) FiO2 (%):  [50 %-100 %] 50 % (06/17 0900) Weight:  [103.7 kg (228 lb 9.9 oz)] 103.7 kg (228 lb 9.9 oz) (06/17 0500)  Hemodynamic parameters for last 24 hours: PAP: (25-40)/(17-33) 37/22 mmHg CO:  [3.9 L/min-8.1 L/min] 3.9 L/min CI:  [1.8 L/min/m2-3.7 L/min/m2] 1.8 L/min/m2  Intake/Output from previous day: 06/16 0701 - 06/17 0700 In: 8892.8 [I.V.:6812.8; Blood:660; NG/GT:120; IV Piggyback:1300] Out: [id number] [Urine:2655; Emesis/NG output:150; Blood:900; Chest Tube:350] Intake/Output this shift: Total I/O In: 325 [I.V.:325] Out: 225 [Urine:165; Chest Tube:60]  General appearance: alert and cooperative Neurologic: intact Heart: regular rate and rhythm, S1, S2 normal, no murmur, click, rub or gallop Lungs: clear to auscultation bilaterally Extremities: edema mild Wound: dressings dry  Lab Results:  Recent Labs  12/17/15 1930 12/18/15 0300  WBC 14.8* 14.2*  HGB 10.9* 11.9*  HCT 33.1* 35.2*  PLT 192 235   BMET:  Recent Labs  12/17/15 0523  12/17/15 1741 12/17/15 1925 12/18/15 0300  NA 140  < > 139 141 135  K 4.2  < > 3.9 3.8 4.1  CL 104  < > 104  --  107  CO2 29  --   --   --  23  GLUCOSE 107*  < > 119* 122* 148*  BUN 11  < > 9  --  8  CREATININE 1.17  < > 0.90  --  0.93  CALCIUM 9.1  --   --   --  8.2*  < > = values in this interval not  displayed.  PT/INR:  Recent Labs  12/17/15 1930  LABPROT 17.9*  INR 1.47   ABG    Component Value Date/Time   PHART 7.373 12/18/2015 0312   HCO3 23.8 12/18/2015 0312   TCO2 25 12/18/2015 0312   ACIDBASEDEF 1.0 12/18/2015 0312   O2SAT 96.0 12/18/2015 0312   CBG (last 3)  No results for input(s): GLUCAP in the last 72 hours.  CLINICAL DATA: Atelectasis  EXAM: PORTABLE CHEST 1 VIEW  COMPARISON: Chest radiograph from one day prior.  FINDINGS: Endotracheal tube tip is 5.0 cm above the carina. Right internal jugular Swan-Ganz catheter terminates in the right pulmonary artery. Enteric tube terminates in the proximal stomach. Left chest tube and mediastinal drain are stable in position. Sternotomy wires appear aligned and intact. Stable cardiomediastinal silhouette with mild greater. No pneumothorax. No definite pleural effusion. Low lung volumes. Mild pulmonary edema appears stable. Bibasilar lung opacities appear slightly increased, favor atelectasis.  IMPRESSION: 1. Support structures as described. 2. No pneumothorax. 3. Mild cardiomegaly and stable mild pulmonary edema. 4. Low lung volumes with slightly worsened bibasilar lung opacities favoring atelectasis.   Electronically Signed  By: Ilona Sorrel M.D.  On: 12/18/2015 07:46  Assessment/Plan: S/P Procedure(s) (LRB): CORONARY ARTERY BYPASS GRAFTING (CABG) x three , using left internal mammary artery and right leg greater saphenous vein (N/A) CLIPPING  OF ATRIAL APPENDAGE (Left)  He is hemodynamically stable on milrinone 0.2 and dop 5, neo 40.  Preop  EF 25-30%.  Still on vent due to postop hypoxemia requiring increase in FiO2 and PEEP. He is better this am so will decrease PEEP and wean to extubate.  New onset atrial fib preop. A-paced on IV amio postop. Had clipping of LAA. Will hold off on coumadin unless he has recurrent A-fib.  After extubation will dangle and may remove chest tubes.  DM: preop  Hgb A1c was only 4.8. Postop hyperglycemia probably due to surgical stress and inotropes. Will give small dose of Levemir to get off drip.     LOS: 2 days    Gaye Pollack 12/18/2015

## 2015-12-18 NOTE — Telephone Encounter (Signed)
-----   Message from Mena Goes, RN sent at 12/17/2015  1:15 PM EDT ----- Regarding: schedule   ----- Message -----    From: Serafina Mitchell, MD    Sent: 12/17/2015  10:48 AM      To: [facility] Charge Pool  12/17/2015:  Level 4  consult Schedule follow-up with me in 6 weeks to discuss carotid endarterectomy

## 2015-12-19 ENCOUNTER — Inpatient Hospital Stay (HOSPITAL_COMMUNITY): Payer: 59

## 2015-12-19 DIAGNOSIS — Z951 Presence of aortocoronary bypass graft: Secondary | ICD-10-CM

## 2015-12-19 LAB — CBC
HEMATOCRIT: 34.8 % — AB (ref 39.0–52.0)
HEMOGLOBIN: 11.5 g/dL — AB (ref 13.0–17.0)
MCH: 29.4 pg (ref 26.0–34.0)
MCHC: 33 g/dL (ref 30.0–36.0)
MCV: 89 fL (ref 78.0–100.0)
PLATELETS: 237 10*3/uL (ref 150–400)
RBC: 3.91 MIL/uL — AB (ref 4.22–5.81)
RDW: 13.3 % (ref 11.5–15.5)
WBC: 12.5 10*3/uL — AB (ref 4.0–10.5)

## 2015-12-19 LAB — BASIC METABOLIC PANEL
ANION GAP: 9 (ref 5–15)
BUN: 10 mg/dL (ref 6–20)
CO2: 23 mmol/L (ref 22–32)
Calcium: 8.3 mg/dL — ABNORMAL LOW (ref 8.9–10.3)
Chloride: 100 mmol/L — ABNORMAL LOW (ref 101–111)
Creatinine, Ser: 0.99 mg/dL (ref 0.61–1.24)
GFR calc Af Amer: >60 mL/min (ref >60)
GLUCOSE: 160 mg/dL — AB (ref 65–99)
POTASSIUM: 3.9 mmol/L (ref 3.5–5.1)
Sodium: 132 mmol/L — ABNORMAL LOW (ref 135–145)

## 2015-12-19 LAB — GLUCOSE, CAPILLARY
Glucose-Capillary: 106 mg/dL — ABNORMAL HIGH (ref 65–99)
Glucose-Capillary: 118 mg/dL — ABNORMAL HIGH (ref 65–99)
Glucose-Capillary: 121 mg/dL — ABNORMAL HIGH (ref 65–99)
Glucose-Capillary: 158 mg/dL — ABNORMAL HIGH (ref 65–99)
Glucose-Capillary: 83 mg/dL (ref 65–99)

## 2015-12-19 MED ORDER — KETOROLAC TROMETHAMINE 15 MG/ML IJ SOLN
15.0000 mg | Freq: Once | INTRAMUSCULAR | Status: AC
  Administered 2015-12-19: 15 mg via INTRAVENOUS

## 2015-12-19 MED ORDER — KETOROLAC TROMETHAMINE 15 MG/ML IJ SOLN
INTRAMUSCULAR | Status: AC
  Filled 2015-12-19: qty 1

## 2015-12-19 MED ORDER — METOCLOPRAMIDE HCL 5 MG/ML IJ SOLN
10.0000 mg | Freq: Four times a day (QID) | INTRAMUSCULAR | Status: AC
  Administered 2015-12-19 – 2015-12-20 (×4): 10 mg via INTRAVENOUS
  Filled 2015-12-19 (×4): qty 2

## 2015-12-19 MED ORDER — KETOROLAC TROMETHAMINE 15 MG/ML IJ SOLN: 15.0000 mg | Freq: Four times a day (QID) | INTRAMUSCULAR | Status: DC | PRN

## 2015-12-19 MED ORDER — WARFARIN - PHYSICIAN DOSING INPATIENT
Freq: Every day | Status: DC
Start: 2015-12-19 — End: 2015-12-22
  Administered 2015-12-19: 18:00:00

## 2015-12-19 MED ORDER — FUROSEMIDE 10 MG/ML IJ SOLN
40.0000 mg | Freq: Once | INTRAMUSCULAR | Status: AC
  Administered 2015-12-19: 40 mg via INTRAVENOUS
  Filled 2015-12-19: qty 4

## 2015-12-19 MED ORDER — WARFARIN SODIUM 5 MG PO TABS
5.0000 mg | ORAL_TABLET | Freq: Once | ORAL | Status: AC
  Administered 2015-12-19: 5 mg via ORAL
  Filled 2015-12-19: qty 1

## 2015-12-19 MED ORDER — AMIODARONE IV BOLUS ONLY 150 MG/100ML
150.0000 mg | Freq: Once | INTRAVENOUS | Status: AC
  Administered 2015-12-19: 150 mg via INTRAVENOUS
  Filled 2015-12-19: qty 100

## 2015-12-19 NOTE — Progress Notes (Signed)
Patient ID: [name] [name], male   DOB: 1960/11/15, 55 y.o.   MRN: [id number]  SICU Evening Rounds:  Hemodynamically stable with SBP 130-140. Will wean dopamine.  Remains in atrial fib 70's-80's on amio  Pain much better after removal of tubes.

## 2015-12-19 NOTE — Progress Notes (Signed)
2 Days Post-Op Procedure(s) (LRB): CORONARY ARTERY BYPASS GRAFTING (CABG) x three , using left internal mammary artery and right leg greater saphenous vein (N/A) CLIPPING OF ATRIAL APPENDAGE (Left) Subjective:  Complains of pain and nausea  Objective: Vital signs in last 24 hours: Temp:  [97.4 F (36.3 C)-98 F (36.7 C)] 97.7 F (36.5 C) (06/18 1332) Pulse Rate:  [49-102] 50 (06/18 1200) Cardiac Rhythm:  [-] Atrial fibrillation (06/18 1200) Resp:  [14-33] 19 (06/18 1300) BP: (82-123)/(53-81) 113/68 mmHg (06/18 1300) SpO2:  [91 %-100 %] 97 % (06/18 1200) Arterial Line BP: (72-132)/(49-75) 110/55 mmHg (06/18 1300) FiO2 (%):  [40 %] 40 % (06/18 0800) Weight:  [103.1 kg (227 lb 4.7 oz)] 103.1 kg (227 lb 4.7 oz) (06/18 0400)  Hemodynamic parameters for last 24 hours:    Intake/Output from previous day: 06/17 0701 - 06/18 0700 In: 4706.1 [P.O.:480; I.V.:4096.1; NG/GT:30; IV Piggyback:100] Out: 2765 [Urine:2170; Emesis/NG output:75; Chest Tube:520] Intake/Output this shift: Total I/O In: 407.1 [P.O.:30; I.V.:377.1] Out: 250 [Urine:170; Chest Tube:80]  General appearance: alert and cooperative Neurologic: intact Heart: irregularly irregular rhythm Lungs: decreased BS in bases Abdomen: soft, non-tender; bowel sounds normal; no masses,  no organomegaly Extremities: edema mild Wound: dressing dry  Lab Results:  Recent Labs  12/18/15 1700 12/18/15 1702 12/19/15 0357  WBC 14.1*  --  12.5*  HGB 11.9* 12.9* 11.5*  HCT 35.4* 38.0* 34.8*  PLT 224  --  237   BMET:  Recent Labs  12/18/15 0300  12/18/15 1702 12/19/15 0357  NA 135  --  136 132*  K 4.1  --  3.9 3.9  CL 107  --  100* 100*  CO2 23  --   --  23  GLUCOSE 148*  --  125* 160*  BUN 8  --  9 10  CREATININE 0.93  < > 1.00 0.99  CALCIUM 8.2*  --   --  8.3*  < > = values in this interval not displayed.  PT/INR:  Recent Labs  12/17/15 1930  LABPROT 17.9*  INR 1.47   ABG    Component Value Date/Time   PHART 7.412 12/18/2015 1232   HCO3 22.8 12/18/2015 1232   TCO2 21 12/18/2015 1702   ACIDBASEDEF 1.0 12/18/2015 1232   O2SAT 95.0 12/18/2015 1232   CBG (last 3)   Recent Labs  12/19/15 0001 12/19/15 0753 12/19/15 1329  GLUCAP 118* 121* 106*   CLINICAL DATA: Patient status post coronary artery bypass graft.  EXAM: PORTABLE CHEST 1 VIEW  COMPARISON: Chest radiograph 12/18/2015.  FINDINGS: Interval removal PA catheter with right IJ sheath projecting over the superior vena cava. Interval extubation and removal of enteric tube. Monitoring leads overlie the patient. Left chest tube and mediastinal drain remain in place. Stable cardiomegaly. Low lung volumes. Unchanged bilateral mid lower lung airspace opacities. Small layering bilateral pleural effusions.  IMPRESSION: Interval removal of PA catheter, ETT and enteric tube.  Stable cardiomegaly.  Persistent mid and lower lung airspace opacities and small layering bilateral pleural effusions.   Electronically Signed  By: Lovey Newcomer M.D.  On: 12/19/2015 08:25  Assessment/Plan: S/P Procedure(s) (LRB): CORONARY ARTERY BYPASS GRAFTING (CABG) x three , using left internal mammary artery and right leg greater saphenous vein (N/A) CLIPPING OF ATRIAL APPENDAGE (Left)  He is hemodynamically stable. Weaning neo. Will wean off Milrinone. On dop 5  Remove chest tubes  Diurese volume excess  He has been in atrial fib since yesterday with controlled rate on IV amio. Will give a  bolus today. Will give a dose of coumadin. His LAA has been clipped so will need to decide about continuing coumadin. Discuss with CHO.   Mobilize and IS  Reglan for nausea  Dose of Toradol for pain.   LOS: 3 days    Gaye Pollack 12/19/2015

## 2015-12-20 ENCOUNTER — Encounter (HOSPITAL_COMMUNITY): Payer: Self-pay | Admitting: Thoracic Surgery (Cardiothoracic Vascular Surgery)

## 2015-12-20 ENCOUNTER — Encounter (HOSPITAL_COMMUNITY): Payer: 59

## 2015-12-20 ENCOUNTER — Inpatient Hospital Stay (HOSPITAL_COMMUNITY): Payer: 59

## 2015-12-20 LAB — GLUCOSE, CAPILLARY
Glucose-Capillary: 68 mg/dL (ref 65–99)
Glucose-Capillary: 69 mg/dL (ref 65–99)
Glucose-Capillary: 85 mg/dL (ref 65–99)
Glucose-Capillary: 87 mg/dL (ref 65–99)
Glucose-Capillary: 88 mg/dL (ref 65–99)
Glucose-Capillary: 91 mg/dL (ref 65–99)
Glucose-Capillary: 92 mg/dL (ref 65–99)
Glucose-Capillary: 95 mg/dL (ref 65–99)

## 2015-12-20 LAB — CBC
HEMATOCRIT: 32 % — AB (ref 39.0–52.0)
HEMOGLOBIN: 10.7 g/dL — AB (ref 13.0–17.0)
MCH: 30.5 pg (ref 26.0–34.0)
MCHC: 33.4 g/dL (ref 30.0–36.0)
MCV: 91.2 fL (ref 78.0–100.0)
Platelets: 180 10*3/uL (ref 150–400)
RBC: 3.51 MIL/uL — ABNORMAL LOW (ref 4.22–5.81)
RDW: 13.5 % (ref 11.5–15.5)
WBC: 6 10*3/uL (ref 4.0–10.5)

## 2015-12-20 LAB — BASIC METABOLIC PANEL
ANION GAP: 9 (ref 5–15)
BUN: 17 mg/dL (ref 6–20)
CHLORIDE: 99 mmol/L — AB (ref 101–111)
CO2: 24 mmol/L (ref 22–32)
Calcium: 8.3 mg/dL — ABNORMAL LOW (ref 8.9–10.3)
Creatinine, Ser: 1.06 mg/dL (ref 0.61–1.24)
GFR calc Af Amer: >60 mL/min (ref >60)
GLUCOSE: 101 mg/dL — AB (ref 65–99)
POTASSIUM: 3.8 mmol/L (ref 3.5–5.1)
SODIUM: 132 mmol/L — AB (ref 135–145)

## 2015-12-20 LAB — PROTIME-INR
INR: 1.2 (ref 0.00–1.49)
PROTHROMBIN TIME: 15.4 s — AB (ref 11.6–15.2)

## 2015-12-20 MED ORDER — MOVING RIGHT ALONG BOOK
Freq: Once | Status: AC
  Administered 2015-12-20: 10:00:00
  Filled 2015-12-20: qty 1

## 2015-12-20 MED ORDER — FUROSEMIDE 40 MG PO TABS
40.0000 mg | ORAL_TABLET | Freq: Two times a day (BID) | ORAL | Status: DC
  Administered 2015-12-20 – 2015-12-21 (×3): 40 mg via ORAL
  Filled 2015-12-20 (×3): qty 1

## 2015-12-20 MED ORDER — SODIUM CHLORIDE 0.9 % IV SOLN: 250.0000 mL | INTRAVENOUS | Status: DC | PRN

## 2015-12-20 MED ORDER — INSULIN ASPART 100 UNIT/ML ~~LOC~~ SOLN: 0.0000 [IU] | Freq: Three times a day (TID) | SUBCUTANEOUS | Status: DC

## 2015-12-20 MED ORDER — AMIODARONE HCL 200 MG PO TABS
400.0000 mg | ORAL_TABLET | Freq: Two times a day (BID) | ORAL | Status: DC
  Administered 2015-12-20 (×2): 400 mg via ORAL
  Filled 2015-12-20 (×2): qty 2

## 2015-12-20 MED ORDER — SODIUM CHLORIDE 0.9% FLUSH
3.0000 mL | Freq: Two times a day (BID) | INTRAVENOUS | Status: DC
  Administered 2015-12-20 (×2): 3 mL via INTRAVENOUS

## 2015-12-20 MED ORDER — POTASSIUM CHLORIDE CRYS ER 20 MEQ PO TBCR
20.0000 meq | EXTENDED_RELEASE_TABLET | Freq: Every day | ORAL | Status: DC
  Administered 2015-12-20 – 2015-12-22 (×3): 20 meq via ORAL
  Filled 2015-12-20 (×3): qty 1

## 2015-12-20 MED ORDER — WARFARIN SODIUM 2.5 MG PO TABS
2.5000 mg | ORAL_TABLET | Freq: Every day | ORAL | Status: DC
  Administered 2015-12-20: 2.5 mg via ORAL
  Filled 2015-12-20: qty 1

## 2015-12-20 MED ORDER — PANTOPRAZOLE SODIUM 40 MG PO TBEC
40.0000 mg | DELAYED_RELEASE_TABLET | Freq: Every day | ORAL | Status: DC
  Administered 2015-12-21 – 2015-12-22 (×2): 40 mg via ORAL
  Filled 2015-12-20 (×2): qty 1

## 2015-12-20 MED ORDER — POTASSIUM CHLORIDE 10 MEQ/50ML IV SOLN
10.0000 meq | INTRAVENOUS | Status: AC
  Administered 2015-12-20 (×3): 10 meq via INTRAVENOUS
  Filled 2015-12-20: qty 50

## 2015-12-20 MED ORDER — CARVEDILOL 3.125 MG PO TABS
3.1250 mg | ORAL_TABLET | Freq: Two times a day (BID) | ORAL | Status: DC
  Administered 2015-12-20 – 2015-12-22 (×5): 3.125 mg via ORAL
  Filled 2015-12-20 (×5): qty 1

## 2015-12-20 MED ORDER — ASPIRIN EC 325 MG PO TBEC: 325.0000 mg | DELAYED_RELEASE_TABLET | Freq: Every day | ORAL | Status: DC

## 2015-12-20 MED ORDER — LISINOPRIL 2.5 MG PO TABS
2.5000 mg | ORAL_TABLET | Freq: Every day | ORAL | Status: DC
  Administered 2015-12-20 – 2015-12-22 (×3): 2.5 mg via ORAL
  Filled 2015-12-20 (×3): qty 1

## 2015-12-20 MED ORDER — SODIUM CHLORIDE 0.9% FLUSH: 3.0000 mL | INTRAVENOUS | Status: DC | PRN

## 2015-12-20 NOTE — Progress Notes (Signed)
Hypoglycemic Event  CBG: 68  Treatment: 148ml of Orange juice  Symptoms: none  Follow-up CBG: Time: 0854   CBG Result: 69  Possible Reasons for Event: does not have DM but on levemir/ also pt not eating well  Comments/MD notified: Will recheck in 15 minutes. Will continue to monitor closely.     [name] [name]

## 2015-12-20 NOTE — Progress Notes (Addendum)
Hypoglycemic Event  CBG: 69  Treatment: 276ml of Coke  Symptoms: none  Follow-up CBG: Time: 0918 CBG Result: 87  Possible Reasons for Event: pt does not have DM but on levemir/ also, pt not eating well   Comments/MD notified: Notified Dr. Roxy Manns and plan to discontinue Levemir     [name] [name]

## 2015-12-20 NOTE — Progress Notes (Addendum)
[name]       [place] [address]             [phone]        CARDIOTHORACIC SURGERY PROGRESS NOTE   R3 Days Post-Op Procedure(s) (LRB): CORONARY ARTERY BYPASS GRAFTING (CABG) x three , using left internal mammary artery and right leg greater saphenous vein (N/A) CLIPPING OF ATRIAL APPENDAGE (Left)  Subjective: Feels much better.  Mild soreness in chest.  Ambulated around SICU this morning.  No SOB.  Appetite improved.  Objective: Vital signs: BP Readings from Last 1 Encounters:  12/20/15 104/77   Pulse Readings from Last 1 Encounters:  12/20/15 70   Resp Readings from Last 1 Encounters:  12/20/15 25   Temp Readings from Last 1 Encounters:  12/20/15 98.3 F (36.8 C) Oral    Hemodynamics:    Physical Exam:  Rhythm:   sinus  Breath sounds: clear  Heart sounds:  RRR  Incisions:  Dressing dry, intact  Abdomen:  Soft, non-distended, non-tender  Extremities:  Warm, well-perfused    Intake/Output from previous day: 06/18 0701 - 06/19 0700 In: 1426.4 [P.O.:270; I.V.:1156.4] Out: 1195 [Urine:765; Emesis/NG output:300; Chest Tube:130] Intake/Output this shift: Total I/O In: 113.1 [I.V.:113.1] Out: -   Lab Results:  CBC: Recent Labs  12/19/15 0357 12/20/15 0405  WBC 12.5* 6.0  HGB 11.5* 10.7*  HCT 34.8* 32.0*  PLT 237 180    BMET:  Recent Labs  12/19/15 0357 12/20/15 0405  NA 132* 132*  K 3.9 3.8  CL 100* 99*  CO2 23 24  GLUCOSE 160* 101*  BUN 10 17  CREATININE 0.99 1.06  CALCIUM 8.3* 8.3*     PT/INR:   Recent Labs  12/20/15 0405  LABPROT 15.4*  INR 1.20    CBG (last 3)   Recent Labs  12/19/15 1935 12/19/15 2338 12/20/15 0359  GLUCAP 83 92 85    ABG    Component Value Date/Time   PHART 7.412 12/18/2015 1232   PCO2ART 36.0 12/18/2015 1232   PO2ART 78.0* 12/18/2015 1232   HCO3 22.8 12/18/2015 1232   TCO2 21 12/18/2015 1702   ACIDBASEDEF 1.0 12/18/2015 1232   O2SAT 95.0 12/18/2015 1232     CXR: PORTABLE CHEST 1 VIEW  COMPARISON: Portable chest x-ray of December 19, 2015  FINDINGS: The lungs are better inflated today. The interstitial markings are less prominent. There remain small bilateral pleural effusions and bibasilar atelectasis. The cardiac silhouette remains enlarged. The pulmonary vascularity has normalized. The mediastinal drain has been removed. The right internal jugular Cordis sheath is stable. The left atrial appendage clip and the sternal wires appear stable.  IMPRESSION: Interval improvement in aeration of both lungs with persistent bibasilar atelectasis and trace pleural effusions decreased pulmonary vascular congestion. No pneumothorax is observed.   Electronically Signed  By: David Martinique M.D.  On: 12/20/2015 07:24  Assessment/Plan: S/P Procedure(s) (LRB): CORONARY ARTERY BYPASS GRAFTING (CABG) x three , using left internal mammary artery and right leg greater saphenous vein (N/A) CLIPPING OF ATRIAL APPENDAGE (Left)  Overall doing well POD3 Currently maintaining NSR w/ stable BP off all drips Paroxysmal atrial fibrillation (new onset pre-op) on IV amiodarone Breathing comfortably w/ O2 sats 96-100% 1 L/min Expected post op acute blood loss anemia, Hgb stable 10.7 Expected post op atelectasis, mild Ischemic cardiomyopathy w/ acute exacerbation chronic systolic CHF, I/O's positive yesterday but weight only 1 lb above preop Longstanding tobacco abuse   Convert amiodarone to oral  Mobilize  Gentle diuresis  Low dose beta blocker and ACE-I  ASA + warfarin  Statin  Smoking cessation  Transfer step down  [name], MD 12/20/2015 9:14 AM

## 2015-12-21 ENCOUNTER — Inpatient Hospital Stay (HOSPITAL_COMMUNITY): Payer: 59

## 2015-12-21 DIAGNOSIS — Z0289 Encounter for other administrative examinations: Secondary | ICD-10-CM

## 2015-12-21 LAB — BASIC METABOLIC PANEL
ANION GAP: 7 (ref 5–15)
BUN: 19 mg/dL (ref 6–20)
CALCIUM: 8.4 mg/dL — AB (ref 8.9–10.3)
CO2: 28 mmol/L (ref 22–32)
Chloride: 100 mmol/L — ABNORMAL LOW (ref 101–111)
Creatinine, Ser: 1.31 mg/dL — ABNORMAL HIGH (ref 0.61–1.24)
Glucose, Bld: 116 mg/dL — ABNORMAL HIGH (ref 65–99)
POTASSIUM: 4.6 mmol/L (ref 3.5–5.1)
SODIUM: 135 mmol/L (ref 135–145)

## 2015-12-21 LAB — PROTIME-INR
INR: 1.26 (ref 0.00–1.49)
Prothrombin Time: 15.9 seconds — ABNORMAL HIGH (ref 11.6–15.2)

## 2015-12-21 LAB — CBC
HCT: 33.8 % — ABNORMAL LOW (ref 39.0–52.0)
Hemoglobin: 11.2 g/dL — ABNORMAL LOW (ref 13.0–17.0)
MCH: 30.2 pg (ref 26.0–34.0)
MCHC: 33.1 g/dL (ref 30.0–36.0)
MCV: 91.1 fL (ref 78.0–100.0)
PLATELETS: 218 10*3/uL (ref 150–400)
RBC: 3.71 MIL/uL — AB (ref 4.22–5.81)
RDW: 13.6 % (ref 11.5–15.5)
WBC: 8.9 10*3/uL (ref 4.0–10.5)

## 2015-12-21 LAB — GLUCOSE, CAPILLARY: Glucose-Capillary: 99 mg/dL (ref 65–99)

## 2015-12-21 MED ORDER — AMIODARONE HCL 200 MG PO TABS
200.0000 mg | ORAL_TABLET | Freq: Two times a day (BID) | ORAL | Status: DC
  Administered 2015-12-21 – 2015-12-22 (×3): 200 mg via ORAL
  Filled 2015-12-21 (×3): qty 1

## 2015-12-21 MED ORDER — PHENOL 1.4 % MT LIQD: 1.0000 | OROMUCOSAL | Status: DC | PRN

## 2015-12-21 MED ORDER — ASPIRIN EC 81 MG PO TBEC
81.0000 mg | DELAYED_RELEASE_TABLET | Freq: Every day | ORAL | Status: DC
  Administered 2015-12-21 – 2015-12-22 (×2): 81 mg via ORAL
  Filled 2015-12-21 (×2): qty 1

## 2015-12-21 MED ORDER — FUROSEMIDE 40 MG PO TABS
40.0000 mg | ORAL_TABLET | Freq: Every day | ORAL | Status: DC
  Administered 2015-12-22: 40 mg via ORAL
  Filled 2015-12-21: qty 1

## 2015-12-21 MED ORDER — WARFARIN SODIUM 5 MG PO TABS
5.0000 mg | ORAL_TABLET | Freq: Every day | ORAL | Status: DC
  Administered 2015-12-21: 5 mg via ORAL
  Filled 2015-12-21: qty 1

## 2015-12-21 MED ORDER — PATIENT'S GUIDE TO USING COUMADIN BOOK
Freq: Once | Status: AC
  Administered 2015-12-21: 18:00:00
  Filled 2015-12-21: qty 1

## 2015-12-21 MED FILL — Sodium Bicarbonate IV Soln 8.4%: INTRAVENOUS | Qty: 50 | Status: AC

## 2015-12-21 MED FILL — Mannitol IV Soln 20%: INTRAVENOUS | Qty: 500 | Status: AC

## 2015-12-21 MED FILL — Electrolyte-R (PH 7.4) Solution: INTRAVENOUS | Qty: 4000 | Status: AC

## 2015-12-21 MED FILL — Lidocaine HCl IV Inj 20 MG/ML: INTRAVENOUS | Qty: 5 | Status: AC

## 2015-12-21 MED FILL — Calcium Chloride Inj 10%: INTRAVENOUS | Qty: 10 | Status: AC

## 2015-12-21 MED FILL — Sodium Chloride IV Soln 0.9%: INTRAVENOUS | Qty: 2000 | Status: AC

## 2015-12-21 MED FILL — Heparin Sodium (Porcine) Inj 1000 Unit/ML: INTRAMUSCULAR | Qty: 20 | Status: AC

## 2015-12-21 NOTE — Care Management Note (Addendum)
Case Management Note Previous CM note initiated by Jacqlyn Krauss RN, CM  Patient Details  Name: [name] [name] MRN: [id number] Date of Birth: 21-Oct-1960  Subjective/Objective:    Pt admitted for A fib/ Chest Pain. Pt is from home with support of wife. Plan for CABG 12-17-15.                 Action/Plan: CM will continue to monitor for additional disposition needs.    Expected Discharge Date:                  Expected Discharge Plan:  [place]  In-House Referral:     Discharge planning Services  CM Consult  Post Acute Care Choice:    Choice offered to:     DME Arranged:    DME Agency:     HH Arranged:    [place] Agency:     Status of Service:  In process, will continue to follow  Medicare Important Message Given:    Date Medicare IM Given:    Medicare IM give by:    Date Additional Medicare IM Given:    Additional Medicare Important Message give by:     If discussed at [place] of Stay Meetings, dates discussed:    Additional Comments:  12/21/15- 1545- Marvetta Gibbons- RN, CM- pt post op CABG-tx from 2S on 2W on 12/10/15-  EPW pulled today- CM to continue to follow.  Dawayne Patricia, RN 12/21/2015, 3:47 PM

## 2015-12-21 NOTE — Progress Notes (Signed)
Order received to DC EPW.  Wires removed with ends intact.  Pt tol well.  Pt instructed on remaining in bed x 1 hour.  Will monitor BP and pulse per protocol.  Pt stable at this time.

## 2015-12-21 NOTE — Progress Notes (Signed)
Utilization review completed.  

## 2015-12-21 NOTE — Progress Notes (Addendum)
      [address]       [place] [address]             [phone]      4 Days Post-Op Procedure(s) (LRB): CORONARY ARTERY BYPASS GRAFTING (CABG) x three , using left internal mammary artery and right leg greater saphenous vein (N/A) CLIPPING OF ATRIAL APPENDAGE (Left)   Subjective:  [name] [name] complains of "itchy throat."  He also has constant nausea, but is unable to determine if anti-emetics are helping.  + ambulation   + BM  Objective: Vital signs in last 24 hours: Temp:  [97.1 F (36.2 C)-98.4 F (36.9 C)] 98.2 F (36.8 C) (06/20 0640) Pulse Rate:  [70-98] 81 (06/20 0640) Cardiac Rhythm:  [-] Normal sinus rhythm (06/19 2002) Resp:  [16-30] 16 (06/19 2022) BP: (91-149)/(62-98) 130/74 mmHg (06/20 0640) SpO2:  [81 %-98 %] 98 % (06/20 0640) FiO2 (%):  [28 %] 28 % (06/19 0920) Weight:  [224 lb 8 oz (101.833 kg)] 224 lb 8 oz (101.833 kg) (06/20 0501)  Intake/Output from previous day: 06/19 0701 - 06/20 0700 In: 299.5 [I.V.:149.5; IV Piggyback:150] Out: 70 [Urine:70]  General appearance: alert, cooperative and no distress Heart: regular rate and rhythm Lungs: clear to auscultation bilaterally Abdomen: soft, non-tender; bowel sounds normal; no masses,  no organomegaly Extremities: extremities normal, atraumatic, no cyanosis or edema Wound: clean and dry  Lab Results:  Recent Labs  12/20/15 0405 12/21/15 0256  WBC 6.0 8.9  HGB 10.7* 11.2*  HCT 32.0* 33.8*  PLT 180 218   BMET:  Recent Labs  12/20/15 0405 12/21/15 0256  NA 132* 135  K 3.8 4.6  CL 99* 100*  CO2 24 28  GLUCOSE 101* 116*  BUN 17 19  CREATININE 1.06 1.31*  CALCIUM 8.3* 8.4*    PT/INR:  Recent Labs  12/21/15 0256  LABPROT 15.9*  INR 1.26   ABG    Component Value Date/Time   PHART 7.412 12/18/2015 1232   HCO3 22.8 12/18/2015 1232   TCO2 21 12/18/2015 1702   ACIDBASEDEF 1.0 12/18/2015 1232   O2SAT 95.0 12/18/2015 1232   CBG (last 3)   Recent Labs  12/20/15 1636  12/20/15 2049 12/21/15 0458  GLUCAP 88 91 99    Assessment/Plan: S/P Procedure(s) (LRB): CORONARY ARTERY BYPASS GRAFTING (CABG) x three , using left internal mammary artery and right leg greater saphenous vein (N/A) CLIPPING OF ATRIAL APPENDAGE (Left)  1. CV- PAF, currently NSR- will continue Coreg, decrease Amiodarone to 200 mg BID to see if can help with nausea, continue Lisinopril 2. INR 1.26, will increase Coumadin to 5 mg daily 3. Pulm- no acute issues, off oxygen, continue IS 4. Renal-creatinine is slowly trending upward, currently at 1.31, weight is trending down, will decrease Lasix to once a day, repeat in AM if continues to be elevated will need to d/c ACE 5. GI- nausea, decreased amiodarone dose, continue zofran prn, all stool softners stopped per patient request... Encouraged patient to take medications with food  6. Dispo- patient stable, will d/c EPW, continue coumadin, monitor creatinine... Possibly d/c in AM   LOS: 5 days    [name], [name] 12/21/2015  I have seen and examined the patient and agree with the assessment and plan as outlined.  Tentatively plan d/c home tomorrow.  Recheck creatinine tomorrow.  [name] Alberts, MD 12/21/2015 10:37 AM

## 2015-12-21 NOTE — Discharge Summary (Signed)
Physician Discharge Summary  Patient ID: [name] [name] MRN: [id number] DOB/AGE: September 28, 1960 55 y.o.  Admit date: 12/13/2015 Discharge date: 12/22/2015  Admission Diagnoses:  Patient Active Problem List   Diagnosis Date Noted  . Acute systolic congestive heart failure ([place])   . Hypercholesterolemia 12/15/2015  . Elevated troponin 12/15/2015  . Coronary artery disease involving native coronary artery with unstable angina pectoris ([place]) 12/15/2015  . NSTEMI (non-ST elevated myocardial infarction) ([place])   . Cardiomyopathy, ischemic 12/14/2015  . Mitral regurgitation 12/14/2015  . Atrial fibrillation with RVR ([place]) 12/13/2015  . Atrial fibrillation with rapid ventricular response ([place]) 12/13/2015  . Cardiomyopathy ([place]) 12/13/2015  . Leukocytosis 12/13/2015  . Tobacco abuse    Discharge Diagnoses:   Patient Active Problem List   Diagnosis Date Noted  . 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 Acute systolic congestive heart failure ([place])   . Hypercholesterolemia 12/15/2015  . Elevated troponin 12/15/2015  . 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 NSTEMI (non-ST elevated myocardial infarction) ([place])   . Cardiomyopathy, ischemic 12/14/2015  . Mitral regurgitation 12/14/2015  . Atrial fibrillation with RVR ([place]) 12/13/2015  . Atrial fibrillation with rapid ventricular response ([place]) 12/13/2015  . Cardiomyopathy ([place]) 12/13/2015  . Leukocytosis 12/13/2015  . Tobacco abuse    Discharged Condition: good  History of Present Illness:  [name] [name] is a 55 year old male with no previous history of coronary artery disease but risk factors notable for long-standing tobacco abuse and hypercholesterolemia who has been referred for surgical consultation to discuss treatment options for management of severe multivessel coronary artery disease with unstable angina, recent non-ST segment elevation myocardial  infarction, new onset paroxysmal atrial fibrillation and acute systolic congestive heart failure. The patient states that he has been physically active and healthy for all of his life. He admits to long-standing tobacco abuse. Approximately 4-6 weeks ago he began to experience intermittent dull pain in the right flank. Symptoms waxed and waned for a period of time but he ultimately presented to his primary care physician. He had routine lab work and underwent abdominal ultrasound because of suspicion of possible gallbladder disease. Abdominal ultrasound was reportedly normal. He was treated for presumed GE reflux disease. Approximately 6 days ago the patient was awoke from his sleep with dull midepigastric and substernal chest pain associated with shortness of breath. Symptoms persisted for several hours and then resolved. 2 days later symptoms recurred. He presented to his primary care physician's office and was noted to be in rapid atrial fibrillation. He was sent directly to the emergency department. Troponin levels were elevated. He was treated with intravenous diltiazem and converted to sinus rhythm.  However, it was felt he should be admitted for further care.    Hospital Course:   Symptoms of chest discomfort and shortness of breath improved. Transthoracic echocardiogram was performed demonstrating severe left ventricular systolic dysfunction with ejection fraction estimated 20-25%. There was moderate mitral regurgitation and severe left atrial enlargement. There were findings suggestive of moderate to severe pulmonary hypertension. The patient was treated with diuretics and shortness of breath improved. He diuresed more than 8 pounds over 48 hours. Left and right heart catheterization was performed 12/15/2015. Patient was found to have moderate to severe left ventricular systolic dysfunction with 100% chronic occlusion of the mid left anterior descending coronary artery, 100% ostial occlusion of  the right coronary artery, 70% stenosis of left main coronary artery and 70% proximal stenosis  of the left circumflex coronary artery. There are left to right collaterals filling the right coronary circulation.  TCTS consult was obtained and the patient was evaluated by Dr. Roxy Manns.  He was in agreement the patient would benefit from coronary bypass grafting procedure with clipping of the LA appendage.  The risks and benefits of the procedure were explained to the patient and she was agreeable to proceed.   He was taken to the operating room on 12/17/2015.  He underwent CABG x 3 utilizing LIMA to LADk SVG to PDA, and SVG to OM.  He also underwent clipping of the LA Appendage and endoscopic harvest of the greater saphenous vein from the right thigh.  He tolerated the procedure without difficulty and was taken to the SICU in stable condition.  During his stay in the SICU the patient was weaned and extubated on POD #1.  He was weaned off Milrinone, Dopamine, and Levophed as tolerated.  His chest tubes and arterial lines were removed without difficulty.  He developed Atrial fibrillation and was treated with Amiodarone drip.  Despite this the patient continued to have Atrial Fibrillation.  He was again re bolused with IV Amiodarone and started on low dose Coumadin therapy.   He later converted to NSR and his Amiodarone was converted to an oral regimen.  He continued to progress and was felt stable for transfer to the telemetry unit on POD #3.  The patient continues to progress.  He remains on Coumadin for PAF.  His INR goal with be between 2.0-3.0.  His level is currently at 1.30 and he is on 5 mg of Coumadin daily.  He has been bothered by nausea throughout his post operative course.  He has not been utilizing pain medications and it was felt Amiodarone is likely the cause.  His oral regimen was decreased to 200 mg BID and we are hopeful his symptoms will improve.  He has a mild elevation in his creatinine level at 1.35.   Repeat level was checked and was back to normal at 1.05   His Lasix regimen was decreased to once aday.     He continues to maintain NSR.  His pacing wires have been removed without difficulty.  In preoperative workup he was found to have Left Carotid Stenosis.  Vascular surgery evaluated the patient and felt this could be addressed at a later date.  A follow up appointment has been made.  He is felt medically stable for discharge home today.      Significant Diagnostic Studies: angiography:    There is moderate to severe left ventricular systolic dysfunction.  Ost LM to LM lesion, 70% stenosed.  Ost RCA lesion, 100% stenosed.  Mid LAD lesion, 100% stenosed.  Prox LAD to Mid LAD lesion, 90% stenosed.  Ost Cx to Prox Cx lesion, 70% stenosed.  Mid Cx lesion, 80% stenosed.  1. Significant left main and severe three-vessel coronary artery disease with ostial occlusion of the right coronary artery and occlusion of the mid LAD. 2. Moderately to severely reduced LV systolic function with an ejection fraction of 30-35%. 3. Right heart catheterization showed mild pulmonary hypertension, moderately elevated filling pressures and normal cardiac output.  Treatments: surgery:    Coronary Artery Bypass Grafting x 3 Left Internal Mammary Artery to Distal Left Anterior Descending Coronary Artery Saphenous Vein Graft to Posterior Descending Coronary Artery Saphenous Vein Graft to Obtuse Marginal Branch of Left Circumflex Coronary Artery Endoscopic Vein Harvest from Right Thigh  Clipping of Left Atrial  Appendage  Disposition: Home  Discharge Medications:  The patient has been discharged on:   1.Beta Blocker:  Yes [x   ]                              No   [   ]                              If No, reason:  2.Ace Inhibitor/ARB: Yes [ x  ]                                     No  [    ]                                     If No,  reason:  3.Statin:   Yes [ x  ]                  No  [   ]                  If No, reason:  4.Ecasa:  Yes  [ x  ]                  No   [   ]                  If No, reason:      Medication List    STOP taking these medications        HYDROcodone-acetaminophen 5-325 MG tablet  Commonly known as:  NORCO/VICODIN     mupirocin ointment 2 %  Commonly known as:  BACTROBAN      TAKE these medications        acetaminophen 500 MG tablet  Commonly known as:  TYLENOL  Take 500 mg by mouth every 6 (six) hours as needed for mild pain.     amiodarone 200 MG tablet  Commonly known as:  PACERONE  Take 1 tablet (200 mg total) by mouth 2 (two) times daily after a meal.     aspirin 81 MG EC tablet  Take 1 tablet (81 mg total) by mouth daily.     atorvastatin 80 MG tablet  Commonly known as:  LIPITOR  Take 1 tablet (80 mg total) by mouth daily at 6 PM.     carvedilol 6.25 MG tablet  Commonly known as:  COREG  Take 1 tablet (6.25 mg total) by mouth 2 (two) times daily with a meal.     furosemide 40 MG tablet  Commonly known as:  LASIX  Take 1 tablet (40 mg total) by mouth daily. For 7 Days     lisinopril 2.5 MG tablet  Commonly known as:  PRINIVIL,ZESTRIL  Take 1 tablet (2.5 mg total) by mouth daily.     ondansetron 4 MG disintegrating tablet  Commonly known as:  ZOFRAN ODT  Take 1 tablet (4 mg total) by mouth every 8 (eight) hours as needed for nausea or vomiting.     pantoprazole 40 MG tablet  Commonly known as:  PROTONIX  Take 1 tablet (40 mg total) by mouth daily.     potassium chloride SA 20 MEQ tablet  Commonly known as:  K-DUR,KLOR-CON  Take 1 tablet (20 mEq total) by mouth daily. For 7 Days     sucralfate 1 g tablet  Commonly known as:  CARAFATE  One tablet ac and hs-mix in 2 oz water     traMADol 50 MG tablet  Commonly known as:  ULTRAM  Take 1-2 tablets (50-100 mg total) by mouth every 4 (four) hours as needed for moderate pain.     warfarin 5 MG tablet   Commonly known as:  COUMADIN  Take 1 tablet (5 mg total) by mouth daily at 6 PM.       Follow-up Information    Follow up with [name], MD On 01/17/2016.   Specialty:  Cardiothoracic Surgery   Why:  Appointment is at 12:30   Contact information:   [address] [place] [address] [phone]       Follow up with [facility] On 01/17/2016.   Why:  Appointment is at 12:00   Contact information:   [facility]       Follow up with Kate Sable, MD On 12/23/2015.   Specialty:  Cardiology   Why:  Please report at 10:00 for PT/INR draw   Contact information:   [place] [address] [phone]       Follow up with Jory Sims, NP On 01/06/2016.   Specialties:  Nurse Practitioner, Radiology, Cardiology   Why:  Appointment is at 3:30   Contact information:   [place]  [address] [phone]       Follow up with Annamarie Major, MD On 01/24/2016.   Specialties:  Vascular Surgery, Cardiology   Why:  Appointment is at 9:00   Contact information:   [place] [address] [phone]       Signed: Ellwood Handler 12/22/2015, 9:04 AM

## 2015-12-21 NOTE — Progress Notes (Addendum)
Pt and wife was off the unit for at least 20 minutes. Security called. Wife was upset over security being called. Per pt and wife they were at the respiratory therapy office speaking to a friend that works there. Pt was never off the heart monitor. Will continue to monitor closely.

## 2015-12-21 NOTE — Discharge Instructions (Signed)
Coronary Artery Bypass Grafting, Care After Refer to this sheet in the next few weeks. These instructions provide you with information on caring for yourself after your procedure. Your health care provider may also give you more specific instructions. Your treatment has been planned according to current medical practices, but problems sometimes occur. Call your health care provider if you have any problems or questions after your procedure. WHAT TO EXPECT AFTER THE PROCEDURE Recovery from surgery will be different for everyone. Some people feel well after 3 or 4 weeks, while for others it takes longer. After your procedure, it is typical to have the following:  Nausea and a lack of appetite.   Constipation.  Weakness and fatigue.   Depression or irritability.   Pain or discomfort at your incision site. HOME CARE INSTRUCTIONS  Take medicines only as directed by your health care provider. Do not stop taking medicines or start any new medicines without first checking with your health care provider.  Take your pulse as directed by your health care provider.  Perform deep breathing as directed by your health care provider. If you were given a device called an incentive spirometer, use it to practice deep breathing several times a day. Support your chest with a pillow or your arms when you take deep breaths or cough.  Keep incision areas clean, dry, and protected. Remove or change any bandages (dressings) only as directed by your health care provider. You may have skin adhesive strips over the incision areas. Do not take the strips off. They will fall off on their own.  Check incision areas daily for any swelling, redness, or drainage.  If incisions were made in your legs, do the following:  Avoid crossing your legs.   Avoid sitting for long periods of time. Change positions every 30 minutes.   Elevate your legs when you are sitting.  Wear compression stockings as directed by your  health care provider. These stockings help keep blood clots from forming in your legs.  Take showers once your health care provider approves. Until then, only take sponge baths. Pat incisions dry. Do not rub incisions with a washcloth or towel. Do not take baths, swim, or use a hot tub until your health care provider approves.  Eat foods that are high in fiber, such as raw fruits and vegetables, whole grains, beans, and nuts. Meats should be lean cut. Avoid canned, processed, and fried foods.  Drink enough fluid to keep your urine clear or pale yellow.  Weigh yourself every day. This helps identify if you are retaining fluid that may make your heart and lungs work harder.  Rest and limit activity as directed by your health care provider. You may be instructed to:  Stop any activity at once if you have chest pain, shortness of breath, irregular heartbeats, or dizziness. Get help right away if you have any of these symptoms.  Move around frequently for short periods or take short walks as directed by your health care provider. Increase your activities gradually. You may need physical therapy or cardiac rehabilitation to help strengthen your muscles and build your endurance.  Avoid lifting, pushing, or pulling anything heavier than 10 lb (4.5 kg) for at least 6 weeks after surgery.  Do not drive until your health care provider approves.  Ask your health care provider when you may return to work.  Ask your health care provider when you may resume sexual activity.  Keep all follow-up visits as directed by your health care  provider. This is important. °SEEK MEDICAL CARE IF: °· You have swelling, redness, increasing pain, or drainage at the site of an incision. °· You have a fever. °· You have swelling in your ankles or legs. °· You have pain in your legs.   °· You gain 2 or more pounds (0.9 kg) a day. °· You are nauseous or vomit. °· You have diarrhea.  °SEEK IMMEDIATE MEDICAL CARE IF: °· You have  chest pain that goes to your jaw or arms. °· You have shortness of breath.   °· You have a fast or irregular heartbeat.   °· You notice a "clicking" in your breastbone (sternum) when you move.   °· You have numbness or weakness in your arms or legs. °· You feel dizzy or light-headed.   °MAKE SURE YOU: °· Understand these instructions. °· Will watch your condition. °· Will get help right away if you are not doing well or get worse. °  °This information is not intended to replace advice given to you by your health care provider. Make sure you discuss any questions you have with your health care provider. °  °Document Released: 01/06/2005 Document Revised: 07/10/2014 Document Reviewed: 11/26/2012 °Elsevier Interactive Patient Education ©2016 [employer] ° °Endoscopic Saphenous Vein Harvesting, Care After °Refer to this sheet in the next few weeks. These instructions provide you with information on caring for yourself after your procedure. Your health care provider may also give you more specific instructions. Your treatment has been planned according to current medical practices, but problems sometimes occur. Call your health care provider if you have any problems or questions after your procedure. °HOME CARE INSTRUCTIONS °Medicine °· Take whatever pain medicine your surgeon prescribes. Follow the directions carefully. Do not take over-the-counter pain medicine unless your surgeon says it is okay. Some pain medicine can cause bleeding problems for several weeks after surgery. °· Follow your surgeon's instructions about driving. You will probably not be permitted to drive after heart surgery. °· Take any medicines your surgeon prescribes. Any medicines you took before your heart surgery should be checked with your health care provider before you start taking them again. °Wound care °· If your surgeon has prescribed an elastic bandage or stocking, ask how long you should wear it. °· Check the area around your surgical  cuts (incisions) whenever your bandages (dressings) are changed. Look for any redness or swelling. °· You will need to return to have the stitches (sutures) or staples taken out. Ask your surgeon when to do that. °· Ask your surgeon when you can shower or bathe. °Activity °· Try to keep your legs raised when you are sitting. °· Do any exercises your health care providers have given you. These may include deep breathing exercises, coughing, walking, or other exercises. °SEEK MEDICAL CARE IF: °· You have any questions about your medicines. °· You have more leg pain, especially if your pain medicine stops working. °· New or growing bruises develop on your leg. °· Your leg swells, feels tight, or becomes red. °· You have numbness in your leg. °SEEK IMMEDIATE MEDICAL CARE IF: °· Your pain gets much worse. °· Blood or fluid leaks from any of the incisions. °· Your incisions become warm, swollen, or red. °· You have chest pain. °· You have trouble breathing. °· You have a fever. °· You have more pain near your leg incision. °MAKE SURE YOU: °· Understand these instructions. °· Will watch your condition. °· Will get help right away if you are not doing well or   get worse.   This information is not intended to replace advice given to you by your health care provider. Make sure you discuss any questions you have with your health care provider.   Document Released: 03/01/2011 Document Revised: 07/10/2014 Document Reviewed: 03/01/2011 Elsevier Interactive Patient Education 2016 [employer] Inc[employer]  Warfarin: What You Need to Know Warfarin is an anticoagulant. Anticoagulants help prevent the formation of blood clots. They also help stop the growth of blood clots. Warfarin is sometimes referred to as a "blood thinner."  Normally, when body tissues are cut or damaged, the blood clots in order to prevent blood loss. Sometimes clots form inside your blood vessels and obstruct the flow of blood through your circulatory system  (thrombosis). These clots may travel through your bloodstream and become lodged in smaller blood vessels in your brain, which can cause a stroke, or in your lungs (pulmonary embolism). WHO SHOULD USE WARFARIN? Warfarin is prescribed for people at risk of developing harmful blood clots:  People with surgically implanted mechanical heart valves, irregular heart rhythms called atrial fibrillation, and certain clotting disorders.  People who have developed harmful blood clotting in the past, including those who have had a stroke or a pulmonary embolism, or thrombosis in their legs (deep vein thrombosis [DVT]).  People with an existing blood clot, such as a pulmonary embolism. WARFARIN DOSING Warfarin tablets come in different strengths. Each tablet strength is a different color, with the amount of warfarin (in milligrams) clearly printed on the tablet. If the color of your tablet is different than usual when you receive a new prescription, report it immediately to your pharmacist or health care provider. WARFARIN MONITORING The goal of warfarin therapy is to lessen the clotting tendency of blood but not prevent clotting completely. Your health care provider will monitor the anticoagulation effect of warfarin closely and adjust your dose as needed. For your safety, blood tests called prothrombin time (PT) or international normalized ratio (INR) are used to measure the effects of warfarin. Both of these tests can be done with a finger stick or a blood draw. The longer it takes the blood to clot, the higher the PT or INR. Your health care provider will inform you of your "target" PT or INR range. If, at any time, your PT or INR is above the target range, there is a risk of bleeding. If your PT or INR is below the target range, there is a risk of clotting. Whether you are started on warfarin while you are in the hospital or in your health care provider's office, you will need to have your PT or INR checked  within one week of starting the medicine. Initially, some people are asked to have their PT or INR checked as much as twice a week. Once you are on a stable maintenance dose, the PT or INR is checked less often, usually once every 2 to 4 weeks. The warfarin dose may be adjusted if the PT or INR is not within the target range. It is important to keep all laboratory and health care provider follow-up appointments. Not keeping appointments could result in a chronic or permanent injury, pain, or disability because warfarin is a medicine that requires close monitoring. WHAT ARE THE SIDE EFFECTS OF WARFARIN?  Too much warfarin can cause bleeding (hemorrhage) from any part of the body. This may include bleeding from the gums, blood in the urine, bloody or dark stools, a nosebleed that is not easily stopped, coughing up blood, or vomiting  blood.  Too little warfarin can increase the risk of blood clots.  Too little or too much warfarin can also increase the risk of a stroke.  Warfarin use may cause a skin rash or irritation, an unusual fever, continual nausea or stomach upset, or severe pain in your joints or back. SPECIAL PRECAUTIONS WHILE TAKING WARFARIN Warfarin should be taken exactly as directed. It is very important to take warfarin as directed since bleeding or blood clots could result in chronic or permanent injury, pain, or disability.  Take your medicine at the same time every day. If you forget to take your dose, you can take it if it is within 6 hours of when it was due.  Do not change the dose of warfarin on your own to make up for missed or extra doses.  If you miss more than 2 doses in a row, you should contact your health care provider for advice. Avoid situations that cause bleeding. You may have a tendency to bleed more easily than usual while taking warfarin. The following actions can limit bleeding:  Using a softer toothbrush.  Flossing with waxed floss rather than unwaxed  floss.  Shaving with an Copy rather than a blade.  Limiting the use of sharp objects.  Avoiding potentially harmful activities, such as contact sports. Warfarin and Pregnancy or Breastfeeding  Warfarin is not advised during the first trimester of pregnancy due to an increased risk of birth defects. In certain situations, a woman may take warfarin after her first trimester of pregnancy. A woman who becomes pregnant or plans to become pregnant while taking warfarin should notify her health care provider immediately.  Although warfarin does not pass into breast milk, a woman who wishes to breastfeed while taking warfarin should also consult with her health care provider. Alcohol, Smoking, and Illicit Drug Use  Alcohol affects how warfarin works in the body. It is best to avoid alcoholic drinks or consume very small amounts while taking warfarin. In general, alcohol intake should be limited to 1 oz (30 mL) of liquor, 6 oz (180 mL) of wine, or 12 oz (360 mL) of beer each day. Notify your health care provider if you change your alcohol intake.  Smoking affects how warfarin works. It is best to avoid smoking while taking warfarin. Notify your health care provider if you change your smoking habits.  It is best to avoid all illicit drugs while taking warfarin since there are few studies that show how warfarin interacts with these drugs. Other Medicines and Dietary Supplements Many prescription and over-the-counter medicines can interfere with warfarin. Be sure all of your health care providers know you are taking warfarin. Notify your health care provider who prescribed warfarin for you or your pharmacist before starting or stopping any new medicines, including over-the-counter vitamins, dietary supplements, and pain medicines. Your warfarin dose may need to be adjusted. Some common over-the-counter medicines that may increase the risk of bleeding while taking warfarin include:    Acetaminophen.  Aspirin.  Nonsteroidal anti-inflammatory medicines (NSAIDs), such as ibuprofen or naproxen.  Vitamin E. Dietary Considerations  Foods that have moderate or high amounts of vitamin K can interfere with warfarin. Avoid major changes in your diet or notify your health care provider before changing your diet. Eat a consistent amount of foods that have moderate or high amounts of vitamin K. Eating less foods containing vitamin K can increase the risk of bleeding. Eating more foods containing vitamin K can increase the risk of blood  clots. Additional questions about dietary considerations can be discussed with a dietitian. Foods that are very high in vitamin K:  Greens, such as Swiss chard and beet, collard, mustard, or turnip greens (fresh or frozen, cooked).  Kale (fresh or frozen, cooked).  Parsley (raw).  Spinach (cooked). Foods that are high in vitamin K:  Asparagus (frozen, cooked).  Broccoli.  Bok choy (cooked).  Brussels sprouts (fresh or frozen, cooked).  Cabbage (cooked).   Coleslaw. Foods that are moderately high in vitamin K:  Blueberries.  Black-eyed peas.  Endive (raw).  Green leaf lettuce (raw).  Green scallions (raw).  Kale (raw).  Okra (frozen, cooked).  Plantains (fried).  Romaine lettuce (raw).  Sauerkraut (canned).  Spinach (raw). CALL YOUR CLINIC OR HEALTH CARE PROVIDER IF YOU:  Plan to have any surgery or procedure.  Feel sick, especially if you have diarrhea or vomiting.  Experience or anticipate any major changes in your diet.  Start or stop a prescription or over-the-counter medicine.  Become, plan to become, or think you may be pregnant.  Are having heavier than usual menstrual periods.  Have had a fall, accident, or any symptoms of bleeding or unusual bruising.  Develop an unusual fever. CALL 911 IN THE [place] OR GO TO THE EMERGENCY DEPARTMENT IF YOU:   Think you may be having an allergic reaction to  warfarin. The signs of an allergic reaction could include itching, rash, hives, swelling, chest tightness, or trouble breathing.  See signs of blood in your urine. The signs could include reddish, pinkish, or tea-colored urine.  See signs of blood in your stools. The signs could include bright red or black stools.  Vomit or cough up blood. In these instances, the blood could have either a bright red or a "coffee-grounds" appearance.  Have bleeding that will not stop after applying pressure for 30 minutes such as cuts, nosebleeds, or other injuries.  Have severe pain in your joints or back.  Have a new and severe headache.  Have sudden weakness or numbness of your face, arm, or leg, especially on one side of your body.  Have sudden confusion or trouble understanding.  Have sudden trouble seeing in one or both eyes.  Have sudden trouble walking, dizziness, loss of balance, or coordination.  Have trouble speaking or understanding (aphasia).   This information is not intended to replace advice given to you by your health care provider. Make sure you discuss any questions you have with your health care provider.   Document Released: 06/19/2005 Document Revised: 07/10/2014 Document Reviewed: 12/13/2012 Elsevier Interactive Patient Education 2016 [employer] and Warfarin Warfarin is a medicine that helps prevent harmful blood clots by causing blood to clot more slowly. It does this by decreasing the activity of vitamin K, which promotes normal blood clotting. For the dose of warfarin you have been prescribed to work well, you need to get about the same amount of vitamin K from your food from day to day. Suddenly getting a lot more vitamin K could cause your blood to clot too quickly. A sudden decrease in vitamin K intake could cause your blood to clot too slowly. These changes in vitamin K intake could lead to dangerous blood clotsor to bleeding. WHAT GENERAL GUIDELINES  DO I NEED TO FOLLOW?  Keep your intake of vitamin K consistent from day to day. To do this, you must be aware of which foods contain moderate or high amounts of vitamin K. Listed below are some  foods that are very high, high, or moderately high in vitamin K. If you eat these foods, make sure you eat a consistent amount of them from day to day.  Avoid major changes in your diet, or tell your health care provider before changing your diet.  If you take a multivitamin that contains vitamin K, be sure to take it every day.  If you drink green tea, drink the same amount each day. WHAT FOODS ARE VERY HIGH IN VITAMIN K?   Greens, such as Swiss chard and beet, collard, mustard, or turnip greens (fresh or frozen, cooked).  Kale (fresh or frozen, cooked).   Parsley (raw).  Spinach (cooked).  WHAT FOODS ARE HIGH IN VITAMIN K?  Asparagus (frozen, cooked).  Broccoli.   Bok choy (cooked).   Brussels sprouts (fresh or frozen, cooked).  Cabbage (cooked).  Coleslaw. WHAT FOODS ARE MODERATELY HIGH IN VITAMIN K?  Blueberries.  Black-eyed peas.  Endive (raw).   Green leaf lettuce (raw).   Green scallions (raw).  Kale (raw).  Okra (frozen, cooked).  Plantains (fried).  Romaine lettuce (raw).   Sauerkraut (canned).   Spinach (raw).   This information is not intended to replace advice given to you by your health care provider. Make sure you discuss any questions you have with your health care provider.   Document Released: 04/16/2009 Document Revised: 07/10/2014 Document Reviewed: 04/23/2013 Elsevier Interactive Patient Education 2016 [place] on my medicine - Coumadin   (Warfarin)  This medication education was reviewed with me or my healthcare representative as part of my discharge preparation.  The pharmacist that spoke with me during my hospital stay was:  Georgina Peer, [facility]  Why was Coumadin prescribed for you? Coumadin was prescribed  for you because you have a blood clot or a medical condition that can cause an increased risk of forming blood clots. Blood clots can cause serious health problems by blocking the flow of blood to the heart, lung, or brain. Coumadin can prevent harmful blood clots from forming. As a reminder your indication for Coumadin is:   Stroke Prevention Because Of Atrial Fibrillation  What test will check on my response to Coumadin? While on Coumadin (warfarin) you will need to have an INR test regularly to ensure that your dose is keeping you in the desired range. The INR (international normalized ratio) number is calculated from the result of the laboratory test called prothrombin time (PT).  If an INR APPOINTMENT HAS NOT ALREADY BEEN MADE FOR YOU please schedule an appointment to have this lab work done by your health care provider within 7 days. Your INR goal is usually a number between:  2 to 3 or your provider may give you a more narrow range like 2-2.5.  Ask your health care provider during an office visit what your goal INR is.  What  do you need to  know  About  COUMADIN? Take Coumadin (warfarin) exactly as prescribed by your healthcare provider about the same time each day.  DO NOT stop taking without talking to the doctor who prescribed the medication.  Stopping without other blood clot prevention medication to take the place of Coumadin may increase your risk of developing a new clot or stroke.  Get refills before you run out.  What do you do if you miss a dose? If you miss a dose, take it as soon as you remember on the same day then continue your regularly scheduled regimen the next day.  Do not take two doses of Coumadin at the same time.  Important Safety Information A possible side effect of Coumadin (Warfarin) is an increased risk of bleeding. You should call your healthcare provider right away if you experience any of the following: ? Bleeding from an injury or your nose that does not  stop. ? Unusual colored urine (red or dark brown) or unusual colored stools (red or black). ? Unusual bruising for unknown reasons. ? A serious fall or if you hit your head (even if there is no bleeding).  Some foods or medicines interact with Coumadin (warfarin) and might alter your response to warfarin. To help avoid this: ? Eat a balanced diet, maintaining a consistent amount of Vitamin K. ? Notify your provider about major diet changes you plan to make. ? Avoid alcohol or limit your intake to 1 drink for women and 2 drinks for men per day. (1 drink is 5 oz. wine, 12 oz. beer, or 1.5 oz. liquor.)  Make sure that ANY health care provider who prescribes medication for you knows that you are taking Coumadin (warfarin).  Also make sure the healthcare provider who is monitoring your Coumadin knows when you have started a new medication including herbals and non-prescription products.  Coumadin (Warfarin)  Major Drug Interactions  Increased Warfarin Effect Decreased Warfarin Effect  Alcohol (large quantities) Antibiotics (esp. Septra/Bactrim, Flagyl, Cipro) Amiodarone (Cordarone) Aspirin (ASA) Cimetidine (Tagamet) Megestrol (Megace) NSAIDs (ibuprofen, naproxen, etc.) Piroxicam (Feldene) Propafenone (Rythmol SR) Propranolol (Inderal) Isoniazid (INH) Posaconazole (Noxafil) Barbiturates (Phenobarbital) Carbamazepine (Tegretol) Chlordiazepoxide (Librium) Cholestyramine (Questran) Griseofulvin Oral Contraceptives Rifampin Sucralfate (Carafate) Vitamin K   Coumadin (Warfarin) Major Herbal Interactions  Increased Warfarin Effect Decreased Warfarin Effect  Garlic Ginseng Ginkgo biloba Coenzyme Q10 Green tea St. Johns wort    Coumadin (Warfarin) FOOD Interactions  Eat a consistent number of servings per week of foods HIGH in Vitamin K (1 serving =  cup)  Collards (cooked, or boiled & drained) Kale (cooked, or boiled & drained) Mustard greens (cooked, or boiled &  drained) Parsley *serving size only =  cup Spinach (cooked, or boiled & drained) Swiss chard (cooked, or boiled & drained) Turnip greens (cooked, or boiled & drained)  Eat a consistent number of servings per week of foods MEDIUM-HIGH in Vitamin K (1 serving = 1 cup)  Asparagus (cooked, or boiled & drained) Broccoli (cooked, boiled & drained, or raw & chopped) Brussel sprouts (cooked, or boiled & drained) *serving size only =  cup Lettuce, raw (green leaf, endive, romaine) Spinach, raw Turnip greens, raw & chopped   These websites have more information on Coumadin (warfarin):  FailFactory.se; VeganReport.com.au;  Information about your medication:  Beta Blocker (Heart rate/Blood Pressure-lowering agent)  Generic Name (Brand): metoprolol tartrate (Lopressor), metoprolol succinate (Toprol XL), carvedilol (Coreg), Bisoprolol (Zebeta)  PURPOSE: You are taking this medication to lower blood pressure and heart rate to help prevent further damage to your heart and to reduce your risk of death following your cardiac event.   Common SIDE EFFECTS you may experience include: fatigue, dizziness, diarrhea, or nausea. This medication may also mask the signs of hypoglycemia  Take your medication exactly as prescribed.   Contact your health care provider if you experience: severely lower bllod pressure, syncope, palpitations, or chest pain muscle ----------------------------------------------------------------------------------------------------------------------  Information about your medication: Statin (cholesterol-lowering agent)  Generic Name (Brand): atorvastatin (Lipitor), pravastatin (Pravachol), rosuvastatin (Crestor), simvastatin (Zocor)  PURPOSE: You are taking this medication to lower your "bad" cholesterol (LDL) and to prevent heart  attacks and strokes. Statins can also raise your "good" cholesterol (HDL).  Common SIDE EFFECTS you may experience include: muscle  pain or weakness (especially in the legs) and upset stomach.  Take your medication exactly as prescribed. Do not eat large amounts of grapefruit or grapefruit juice while taking this medication.  Contact your health care provider if you experience: severe muscle pain that does not improve, dark urine, or yellowing of your skin or eyes. ----------------------------------------------------------------------------------------------------------------------  .Gerrit Halls

## 2015-12-21 NOTE — Progress Notes (Signed)
CARDIAC REHAB PHASE I   Pt has been ambulating with wife and RW. Doing well, no c/o. Likes to be moving. Ed completed with pt and wife including managing HF (daily wts and low sodium). Pt is motivated to quit smoking and has a plan. Resources given. Will send referral to [facility].  [place], ACSM 12/21/2015 2:14 PM

## 2015-12-21 NOTE — Progress Notes (Signed)
Notified on call MD Servando Snare) of this patient's change in rhythm from sinus rhythm to afib. Patient denies SOB, CP, vitals stable, no distress. MD said to monitor patient.

## 2015-12-22 LAB — BASIC METABOLIC PANEL
Anion gap: 8 (ref 5–15)
BUN: 16 mg/dL (ref 6–20)
CO2: 28 mmol/L (ref 22–32)
CREATININE: 1.05 mg/dL (ref 0.61–1.24)
Calcium: 8.2 mg/dL — ABNORMAL LOW (ref 8.9–10.3)
Chloride: 99 mmol/L — ABNORMAL LOW (ref 101–111)
GFR calc Af Amer: >60 mL/min (ref >60)
Glucose, Bld: 131 mg/dL — ABNORMAL HIGH (ref 65–99)
Potassium: 3.8 mmol/L (ref 3.5–5.1)
SODIUM: 135 mmol/L (ref 135–145)

## 2015-12-22 LAB — PROTIME-INR
INR: 1.3 (ref 0.00–1.49)
PROTHROMBIN TIME: 16.3 s — AB (ref 11.6–15.2)

## 2015-12-22 MED ORDER — CARVEDILOL 6.25 MG PO TABS: 6.2500 mg | ORAL_TABLET | Freq: Two times a day (BID) | ORAL | Status: DC

## 2015-12-22 MED ORDER — FUROSEMIDE 40 MG PO TABS: 40.0000 mg | ORAL_TABLET | Freq: Every day | ORAL | Status: DC

## 2015-12-22 MED ORDER — WARFARIN SODIUM 5 MG PO TABS: 5.0000 mg | ORAL_TABLET | Freq: Every day | ORAL | Status: DC

## 2015-12-22 MED ORDER — AMIODARONE HCL 200 MG PO TABS: 200.0000 mg | ORAL_TABLET | Freq: Two times a day (BID) | ORAL | Status: DC

## 2015-12-22 MED ORDER — TRAMADOL HCL 50 MG PO TABS: 50.0000 mg | ORAL_TABLET | ORAL | Status: DC | PRN

## 2015-12-22 MED ORDER — ATORVASTATIN CALCIUM 80 MG PO TABS: 80.0000 mg | ORAL_TABLET | Freq: Every day | ORAL | Status: DC

## 2015-12-22 MED ORDER — ASPIRIN 81 MG PO TBEC: 81.0000 mg | DELAYED_RELEASE_TABLET | Freq: Every day | ORAL | Status: DC

## 2015-12-22 MED ORDER — POTASSIUM CHLORIDE CRYS ER 20 MEQ PO TBCR: 20.0000 meq | EXTENDED_RELEASE_TABLET | Freq: Every day | ORAL | Status: DC

## 2015-12-22 MED ORDER — LISINOPRIL 2.5 MG PO TABS: 2.5000 mg | ORAL_TABLET | Freq: Every day | ORAL | Status: DC

## 2015-12-22 NOTE — Progress Notes (Signed)
Order received to DC Pt.  Telemetry removed and CCMD notified.  IV removed with catheter intact.  Discharge education given.  All questions answered.  Pt denies chest pain at discharge.  Pt stable to discharge.

## 2015-12-22 NOTE — Progress Notes (Signed)
Order received to DC chest tube sutures. Sutures removed.  Pt tol well.  Sites covered with steristrips.  No discharge noted.

## 2015-12-22 NOTE — Consult Note (Signed)
   [facility] CM Inpatient Consult   12/22/2015  EDGER FENNEWALD 1960-12-19 [id number]   Came by to visit Mr. Tosti prior to discharge on behalf of [facility] Management program for [employer] employees/dependents with [facility] insurance. Provided packet/program/contact information. Made aware that he will receive post hospital follow up call post discharge. Confirmed best contact number as [phone]. Conversation was quick as Mr. Bustillos was preparing to discharge. Appreciative of visit. Made inpatient RNCM aware.   Marthenia Rolling, MSN-Ed, RN,BSN [facility] Liaison [phone]

## 2015-12-22 NOTE — Progress Notes (Signed)
      [address]       [place] [address]             [phone]      5 Days Post-Op Procedure(s) (LRB): CORONARY ARTERY BYPASS GRAFTING (CABG) x three , using left internal mammary artery and right leg greater saphenous vein (N/A) CLIPPING OF ATRIAL APPENDAGE (Left)   Subjective:  Wants to go home.  No nausea yesterday.  + ambulation   + BM  Objective: Vital signs in last 24 hours: Temp:  [97.6 F (36.4 C)-98.4 F (36.9 C)] 98.4 F (36.9 C) (06/21 0505) Pulse Rate:  [80-92] 85 (06/21 0505) Cardiac Rhythm:  [-] Atrial fibrillation (06/21 0730) Resp:  [16-20] 20 (06/21 0505) BP: (107-116)/(61-82) 115/73 mmHg (06/21 0505) SpO2:  [99 %-100 %] 99 % (06/21 0505) Weight:  [224 lb 11.2 oz (101.923 kg)] 224 lb 11.2 oz (101.923 kg) (06/21 0505)  Intake/Output from previous day: 06/20 0701 - 06/21 0700 In: 480 [P.O.:480] Out: -   General appearance: alert, cooperative and no distress Heart: irregularly irregular rhythm Lungs: clear to auscultation bilaterally Abdomen: soft, non-tender; bowel sounds normal; no masses,  no organomegaly Extremities: edema 1+ Wound: clean and dry  Lab Results:  Recent Labs  12/20/15 0405 12/21/15 0256  WBC 6.0 8.9  HGB 10.7* 11.2*  HCT 32.0* 33.8*  PLT 180 218   BMET:  Recent Labs  12/21/15 0256 12/22/15 0300  NA 135 135  K 4.6 3.8  CL 100* 99*  CO2 28 28  GLUCOSE 116* 131*  BUN 19 16  CREATININE 1.31* 1.05  CALCIUM 8.4* 8.2*    PT/INR:  Recent Labs  12/22/15 0300  LABPROT 16.3*  INR 1.30   ABG    Component Value Date/Time   PHART 7.412 12/18/2015 1232   HCO3 22.8 12/18/2015 1232   TCO2 21 12/18/2015 1702   ACIDBASEDEF 1.0 12/18/2015 1232   O2SAT 95.0 12/18/2015 1232   CBG (last 3)   Recent Labs  12/20/15 1636 12/20/15 2049 12/21/15 0458  GLUCAP 88 91 99    Assessment/Plan: S/P Procedure(s) (LRB): CORONARY ARTERY BYPASS GRAFTING (CABG) x three , using left internal mammary artery and  right leg greater saphenous vein (N/A) CLIPPING OF ATRIAL APPENDAGE (Left)  1. CV- A. Fib, currently rate controlled- continue Amiodarone,  Will increase Coreg, and Lisinopril 2. Pulm-no acute issues, continue IS 3. Renal- creatinine trended back down, currently at 1.05, will continue lasix 4. INR 1.30, will repeat dose of 5 mg daily 5. GI- no further nausea yesterday 6. Dispo- patient in rate controlled A. Fib, increase Coreg, continue Coumadin, plan for home today if okay with Dr. Roxy Manns   LOS: 6 days    Ellwood Handler 12/22/2015

## 2015-12-23 ENCOUNTER — Ambulatory Visit (INDEPENDENT_AMBULATORY_CARE_PROVIDER_SITE_OTHER): Payer: 59 | Admitting: *Deleted

## 2015-12-23 DIAGNOSIS — Z5181 Encounter for therapeutic drug level monitoring: Secondary | ICD-10-CM | POA: Diagnosis not present

## 2015-12-23 DIAGNOSIS — I4891 Unspecified atrial fibrillation: Secondary | ICD-10-CM | POA: Diagnosis not present

## 2015-12-23 LAB — POCT INR: INR: 1.3

## 2015-12-24 ENCOUNTER — Telehealth: Payer: Self-pay | Admitting: Adult Health

## 2015-12-24 NOTE — Telephone Encounter (Signed)
Patient reports weight yesterday of 226 lb and weight today of 226.6 lb. Patient states that weights have been stable, he denies SOB and chest pain. Pt reports that he has been elevating legs above his heart. Please advise

## 2015-12-24 NOTE — Telephone Encounter (Signed)
Patient notified and voiced understanding.

## 2015-12-24 NOTE — Telephone Encounter (Signed)
May take additional 20 mg today and tomorrow only. Keep legs elevated, avoid salt. Add potassium 20 mEq daily to his regimen.

## 2015-12-24 NOTE — Telephone Encounter (Signed)
Patient was prescribed 40 mg Lasix upon d/c from hospital for swelling post CABG. Patient is still having swelling. Does he need to increase. / tg

## 2015-12-27 ENCOUNTER — Ambulatory Visit (INDEPENDENT_AMBULATORY_CARE_PROVIDER_SITE_OTHER): Payer: 59 | Admitting: *Deleted

## 2015-12-27 DIAGNOSIS — I4891 Unspecified atrial fibrillation: Secondary | ICD-10-CM | POA: Diagnosis not present

## 2015-12-27 DIAGNOSIS — Z5181 Encounter for therapeutic drug level monitoring: Secondary | ICD-10-CM | POA: Diagnosis not present

## 2015-12-27 LAB — POCT INR: INR: 1.7

## 2015-12-28 ENCOUNTER — Other Ambulatory Visit: Payer: Self-pay | Admitting: *Deleted

## 2015-12-28 NOTE — Patient Outreach (Signed)
[facility]) Care Management  12/28/2015  [name] [name] 11/06/1960 [id number]  Subjective: Telephone call to patient's home number, no answer, left HIPAA compliant voicemail message, and requested call back.  Objective: Per chart review:  Patient hospitalized 12/13/15 - 12/22/15 for  Acute systolic congestive heart failure, Coronary artery disease involving native coronary artery with unstable angina pectoris,  NSTEMI (non-ST elevated myocardial infarction), Atrial fibrillation, and Cardiomyopathy.    Patient also has a history of Hypercholesterolemia, Mitral regurgitation, and Tobacco abuse.  Assessment: Received [facility] Transition of care referral on 12/22/15.   Transition of care follow up, pending patient contact.    Plan: RNCM will call patient for 2nd attempt telephone outreach within 2 weeks, transition of care follow up,  if no return call.   [name] [name], BSN, [place] Management [facility] Telephonic CM Phone: [phone] Fax: [phone]'

## 2015-12-29 ENCOUNTER — Encounter: Payer: Self-pay | Admitting: Family Medicine

## 2015-12-29 ENCOUNTER — Other Ambulatory Visit: Payer: Self-pay | Admitting: *Deleted

## 2015-12-29 ENCOUNTER — Ambulatory Visit (INDEPENDENT_AMBULATORY_CARE_PROVIDER_SITE_OTHER): Payer: 59 | Admitting: Family Medicine

## 2015-12-29 VITALS — BP 108/64 | Ht 72.0 in | Wt 227.0 lb

## 2015-12-29 DIAGNOSIS — E785 Hyperlipidemia, unspecified: Secondary | ICD-10-CM | POA: Diagnosis not present

## 2015-12-29 DIAGNOSIS — I10 Essential (primary) hypertension: Secondary | ICD-10-CM | POA: Diagnosis not present

## 2015-12-29 DIAGNOSIS — Z79899 Other long term (current) drug therapy: Secondary | ICD-10-CM | POA: Diagnosis not present

## 2015-12-29 DIAGNOSIS — I509 Heart failure, unspecified: Secondary | ICD-10-CM

## 2015-12-29 MED ORDER — POTASSIUM CHLORIDE CRYS ER 20 MEQ PO TBCR: 20.0000 meq | EXTENDED_RELEASE_TABLET | Freq: Every day | ORAL | Status: DC

## 2015-12-29 MED ORDER — FUROSEMIDE 40 MG PO TABS: 40.0000 mg | ORAL_TABLET | Freq: Every day | ORAL | Status: DC

## 2015-12-29 NOTE — Progress Notes (Signed)
   Subjective:    Patient ID: [name] [name], male    DOB: 1961-05-05, 55 y.o.   MRN: [id number] Patient arrives office for follow-up from the hospital HPIFollow up hospitalization.   Pt states no concerns today. States he is doing well.   Wife concerned about his color. Says he is looking a little yellow. Has not been out in the sun lately. Compliant with all his medications  Wants to discuss doing INR here at office instead of cardiology. Had INR two days ago 1.7 and wife wants it rechecked today.   Notes a bit more swelling in his ankles since his Lasix to stop. No shortness of breath no orthopnea  No chest pain reports his heart rate seems to be at a good control. Next  States he is definitely stop smoking  Status post triple bypass surgery.  Currently on Coumadin claims good compliance  Review of Systems No headache, no major weight loss or weight gain, no chest pain no back pain abdominal pain no change in bowel habits complete ROS otherwise negative Alert no acute distress blood pressure excellent on repeat HEENT normal lungs clear heart regular rate and regular rhythm on auscultation today ankles 1+ edema    Objective:   Physical Exam        Assessment & Plan:  Impression 1 coronary artery disease #2 recurrent atrial fibrillation status uncertain but seen a cardiologist in just a couple days #3 hyperlipidemia now treatment #4 ex smoker as of this past month #5 an echocardiogram did reveal significant lowered ejection fraction plan exercise discussed diet discussed blood work next week. Resume Lasix and potassium for now. Will follow renal function. Follow-up in one month. Avoid cigarettes at all cost

## 2015-12-29 NOTE — Patient Outreach (Signed)
[facility]) Care Management  12/29/2015  BONIFACE ROTUNNO 12-Sep-1960 [id number]   Subjective: Received voicemail message from patient's wife Donnavin Belflower), states she is returning call for her husband, and requested call back. Telephone call to patient's home number, no answer, left HIPAA compliant voicemail message, and requested call back.    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 Patient also has a history of Hypercholesterolemia, Mitral regurgitation, and Tobacco abuse.  Assessment: Received [facility]MR Transition of care referral on 12/22/15. Transition of care follow up, pending patient contact.   Plan: RNCM will call patient for 3rd attempt telephone outreach within 2 weeks, transition of care follow up, if no return call.   Krisalyn Yankowski H. Annia Friendly, BSN, [place] Management [facility] Telephonic CM Phone: [phone] Fax: [phone]

## 2015-12-30 ENCOUNTER — Encounter: Payer: Self-pay | Admitting: *Deleted

## 2015-12-30 ENCOUNTER — Other Ambulatory Visit: Payer: Self-pay | Admitting: *Deleted

## 2015-12-30 NOTE — Patient Outreach (Signed)
[name]) Care Management  12/30/2015  [name] [name] [birth year] [id number]  Subjective: Received voicemail message from patient's wife [name] [name]), states she is returning call for her husband, and requested call back on mobile ([phone]).  Telephone call to patient's home number, no answer, left HIPAA compliant voicemail message, and requested call back.  Telephone call to patient's mobile number, spoke with patient, and HIPAA verified.   States he is doing okay.   Patient gave verbal consent to speak with wife Matsuo [name]) regarding healthcare needs as needed.   Discussed [facility] Care Management transition of care follow up and services.   Patient in agreement to completing transition of care follow up questions. Patient states had follow up appointment with primary MD (Dr. [name]) and it went well.    States MD has increased his lasix to decrease the swelling of ankles and no other changes were made.  States cardiologist has given him weight parameters of 3 - 5 lbs weight gain within a week to call cardiologist office.    States his breathing lying down in bed is improving everyday.   Patient states he works for [employer] and his biggest frustration has been with getting his medical leave paperwork filed out by the appropriate MD.   RNCM advised patient of medical leave paperwork process, patient voices understanding and states he has also requested a copy of the paperwork sent by providers to [employer] for his records.  States he and his wife have been in communication with [employer].   They have also spoken with [place] regarding leave paper work, and feels things have finally been straightened out.   States the paperwork has been given to surgeon for completion and the surgeon's office has been advised that the deadline for completion is 01/05/16.  States he will let RNCM know if assistance needed.     Patient states he and his wife have reviewed all the educational  information(books, videos, pamphlets) they received in the hospital and have no additional educational needs at this time.   Patient states he does not have any further transition of care, disease management, disease monitoring, disease educational, transportation, community resource, or pharmacy needs at this time.   Patient in agreement to receive [facility] Management program information.    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 Patient also has a history of Hypercholesterolemia, Mitral regurgitation, and Tobacco abuse.  Assessment: Received [facility]MR Transition of care referral on 12/22/15. Transition of care follow up completed and patient has no further care management needs at this time.   Plan: RNCM will send patient successful outreach letter, [facility] pamphlet, and magnet. RNCM will send case closure due to follow up completion / no additional care management needs request to [facility].    Kristion Holifield H. Annia Friendly, BSN, [place] Management [facility] Telephonic CM Phone: [phone] Fax: [phone]

## 2016-01-05 ENCOUNTER — Other Ambulatory Visit (HOSPITAL_COMMUNITY)
Admission: RE | Admit: 2016-01-05 | Discharge: 2016-01-05 | Disposition: A | Discharge status: Home / Self Care | Payer: 59 | Source: Ambulatory Visit | Attending: Family Medicine | Admitting: Family Medicine

## 2016-01-05 DIAGNOSIS — I509 Heart failure, unspecified: Secondary | ICD-10-CM | POA: Diagnosis not present

## 2016-01-05 DIAGNOSIS — I110 Hypertensive heart disease with heart failure: Secondary | ICD-10-CM | POA: Insufficient documentation

## 2016-01-05 DIAGNOSIS — Z79899 Other long term (current) drug therapy: Secondary | ICD-10-CM | POA: Diagnosis not present

## 2016-01-05 DIAGNOSIS — E785 Hyperlipidemia, unspecified: Secondary | ICD-10-CM | POA: Insufficient documentation

## 2016-01-05 LAB — BASIC METABOLIC PANEL
ANION GAP: 6 (ref 5–15)
BUN: 18 mg/dL (ref 6–20)
CALCIUM: 8.9 mg/dL (ref 8.9–10.3)
CO2: 29 mmol/L (ref 22–32)
CREATININE: 1.11 mg/dL (ref 0.61–1.24)
Chloride: 104 mmol/L (ref 101–111)
GLUCOSE: 117 mg/dL — AB (ref 65–99)
Potassium: 5.2 mmol/L — ABNORMAL HIGH (ref 3.5–5.1)
Sodium: 139 mmol/L (ref 135–145)

## 2016-01-05 LAB — HEPATIC FUNCTION PANEL
ALK PHOS: 148 U/L — AB (ref 38–126)
ALT: 40 U/L (ref 17–63)
AST: 26 U/L (ref 15–41)
Albumin: 3.5 g/dL (ref 3.5–5.0)
Bilirubin, Direct: 0.1 mg/dL (ref 0.1–0.5)
Indirect Bilirubin: 0.5 mg/dL (ref 0.3–0.9)
TOTAL PROTEIN: 7.2 g/dL (ref 6.5–8.1)
Total Bilirubin: 0.6 mg/dL (ref 0.3–1.2)

## 2016-01-05 LAB — LIPID PANEL
Cholesterol: 93 mg/dL (ref 0–200)
HDL: 25 mg/dL — ABNORMAL LOW (ref >40)
LDL CALC: 55 mg/dL (ref 0–99)
TRIGLYCERIDES: 65 mg/dL (ref <150)
Total CHOL/HDL Ratio: 3.7 RATIO
VLDL: 13 mg/dL (ref 0–40)

## 2016-01-05 LAB — BRAIN NATRIURETIC PEPTIDE: B Natriuretic Peptide: 590 pg/mL — ABNORMAL HIGH (ref 0.0–100.0)

## 2016-01-06 ENCOUNTER — Ambulatory Visit (INDEPENDENT_AMBULATORY_CARE_PROVIDER_SITE_OTHER): Payer: 59 | Admitting: Adult Health

## 2016-01-06 ENCOUNTER — Ambulatory Visit (INDEPENDENT_AMBULATORY_CARE_PROVIDER_SITE_OTHER): Payer: 59 | Admitting: *Deleted

## 2016-01-06 ENCOUNTER — Encounter: Payer: Self-pay | Admitting: Adult Health

## 2016-01-06 VITALS — BP 108/72 | HR 63 | Ht 72.0 in | Wt 215.0 lb

## 2016-01-06 DIAGNOSIS — F17201 Nicotine dependence, unspecified, in remission: Secondary | ICD-10-CM

## 2016-01-06 DIAGNOSIS — I5189 Other ill-defined heart diseases: Secondary | ICD-10-CM

## 2016-01-06 DIAGNOSIS — I480 Paroxysmal atrial fibrillation: Secondary | ICD-10-CM

## 2016-01-06 DIAGNOSIS — I4891 Unspecified atrial fibrillation: Secondary | ICD-10-CM

## 2016-01-06 DIAGNOSIS — I2510 Atherosclerotic heart disease of native coronary artery without angina pectoris: Secondary | ICD-10-CM | POA: Diagnosis not present

## 2016-01-06 DIAGNOSIS — I519 Heart disease, unspecified: Secondary | ICD-10-CM

## 2016-01-06 DIAGNOSIS — Z9889 Other specified postprocedural states: Secondary | ICD-10-CM | POA: Diagnosis not present

## 2016-01-06 DIAGNOSIS — I779 Disorder of arteries and arterioles, unspecified: Secondary | ICD-10-CM

## 2016-01-06 DIAGNOSIS — Z5181 Encounter for therapeutic drug level monitoring: Secondary | ICD-10-CM

## 2016-01-06 DIAGNOSIS — I739 Peripheral vascular disease, unspecified: Secondary | ICD-10-CM

## 2016-01-06 LAB — POCT INR: INR: 2.3

## 2016-01-06 MED ORDER — FUROSEMIDE 40 MG PO TABS: 40.0000 mg | ORAL_TABLET | ORAL | Status: DC | PRN

## 2016-01-06 MED ORDER — AMIODARONE HCL 200 MG PO TABS: 200.0000 mg | ORAL_TABLET | Freq: Every day | ORAL | Status: DC

## 2016-01-06 NOTE — Patient Instructions (Signed)
Your physician recommends that you schedule a follow-up appointment in: 3 Months with Dr. Bronson Ing.   Your physician has recommended you make the following change in your medication:   Decrease Pacerone to 200 mg Daily  Decrease Lasix to 40 mg As needed  Chest X-Ray on 7/14 at [place] have been referred to Cardiac Rehab  If you need a refill on your cardiac medications before your next appointment, please call your pharmacy.  Thank you for choosing [facility]!

## 2016-01-06 NOTE — Progress Notes (Signed)
Name: SATISH GONSALEZ    DOB: 1961/02/01  Age: 55 y.o.  MR#: [id number]       PCP:  Mickie Hillier, MD      Insurance: Payor: [employer] / Plan: [place] [employer] / Product Type: *No Product type* /   CC:   No chief complaint on file.   VS Filed Vitals:   01/06/16 1531  Height: 6' (1.829 m)  Weight: 215 lb (97.523 kg)    Weights Current Weight  01/06/16 215 lb (97.523 kg)  12/30/15 221 lb (100.245 kg)  12/29/15 227 lb (102.967 kg)    Blood Pressure  BP Readings from Last 3 Encounters:  12/29/15 108/64  12/22/15 115/73  12/13/15 138/80     Admit date:  (Not on file) Last encounter with RMR:  12/24/2015   Allergy Review of patient's allergies indicates no known allergies.  Current Outpatient Prescriptions  Medication Sig Dispense Refill  . acetaminophen (TYLENOL) 500 MG tablet Take 500 mg by mouth every 6 (six) hours as needed for mild pain.    Marland Kitchen amiodarone (PACERONE) 200 MG tablet Take 1 tablet (200 mg total) by mouth 2 (two) times daily after a meal. 60 tablet 1  . aspirin EC 81 MG EC tablet Take 1 tablet (81 mg total) by mouth daily.    Marland Kitchen atorvastatin (LIPITOR) 80 MG tablet Take 1 tablet (80 mg total) by mouth daily at 6 PM. 30 tablet 3  . carvedilol (COREG) 6.25 MG tablet Take 1 tablet (6.25 mg total) by mouth 2 (two) times daily with a meal. 60 tablet 3  . furosemide (LASIX) 40 MG tablet Take 1 tablet (40 mg total) by mouth daily. 90 tablet 0  . lisinopril (PRINIVIL,ZESTRIL) 2.5 MG tablet Take 1 tablet (2.5 mg total) by mouth daily. 30 tablet 3  . ondansetron (ZOFRAN ODT) 4 MG disintegrating tablet Take 1 tablet (4 mg total) by mouth every 8 (eight) hours as needed for nausea or vomiting. 15 tablet 0  . potassium chloride SA (K-DUR,KLOR-CON) 20 MEQ tablet Take 1 tablet (20 mEq total) by mouth daily. 90 tablet 0  . traMADol (ULTRAM) 50 MG tablet Take 1-2 tablets (50-100 mg total) by mouth every 4 (four) hours as needed for moderate pain. 30 tablet 0  . warfarin (COUMADIN)  5 MG tablet Take 1 tablet (5 mg total) by mouth daily at 6 PM. 30 tablet 3   No current facility-administered medications for this visit.    Discontinued Meds:   There are no discontinued medications.  Patient Active Problem List   Diagnosis Date Noted  . Encounter for therapeutic drug monitoring 12/23/2015  . 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 Acute systolic congestive heart failure ([place])   . Hypercholesterolemia 12/15/2015  . Elevated troponin 12/15/2015  . Coronary artery disease involving native coronary artery with unstable angina pectoris ([place]) 12/15/2015  . NSTEMI (non-ST elevated myocardial infarction) ([place])   . Cardiomyopathy, ischemic 12/14/2015  . Mitral regurgitation 12/14/2015  . Atrial fibrillation with RVR ([place]) 12/13/2015  . Atrial fibrillation with rapid ventricular response ([place]) 12/13/2015  . Cardiomyopathy ([place]) 12/13/2015  . Leukocytosis 12/13/2015  . Tobacco abuse     LABS    Component Value Date/Time   NA 139 01/05/2016 0807   NA 135 12/22/2015 0300   NA 135 12/21/2015 0256   K 5.2* 01/05/2016 0807   K 3.8 12/22/2015 0300   K 4.6 12/21/2015 0256   CL 104 01/05/2016 [id number]  CL 99* 12/22/2015 0300   CL 100* 12/21/2015 0256   CO2 29 01/05/2016 0807   CO2 28 12/22/2015 0300   CO2 28 12/21/2015 0256   GLUCOSE 117* 01/05/2016 0807   GLUCOSE 131* 12/22/2015 0300   GLUCOSE 116* 12/21/2015 0256   BUN 18 01/05/2016 0807   BUN 16 12/22/2015 0300   BUN 19 12/21/2015 0256   CREATININE 1.11 01/05/2016 0807   CREATININE 1.05 12/22/2015 0300   CREATININE 1.31* 12/21/2015 0256   CALCIUM 8.9 01/05/2016 0807   CALCIUM 8.2* 12/22/2015 0300   CALCIUM 8.4* 12/21/2015 0256   GFRNONAA >60 01/05/2016 0807   GFRNONAA >60 12/22/2015 0300   GFRNONAA >60 12/21/2015 0256   GFRAA >60 01/05/2016 0807   GFRAA >60 12/22/2015 0300   GFRAA >60 12/21/2015 0256   CMP     Component Value Date/Time   NA 139 01/05/2016 0807   K 5.2* 01/05/2016 0807    CL 104 01/05/2016 0807   CO2 29 01/05/2016 0807   GLUCOSE 117* 01/05/2016 0807   BUN 18 01/05/2016 0807   CREATININE 1.11 01/05/2016 0807   CALCIUM 8.9 01/05/2016 0807   PROT 7.2 01/05/2016 0807   ALBUMIN 3.5 01/05/2016 0807   AST 26 01/05/2016 0807   ALT 40 01/05/2016 0807   ALKPHOS 148* 01/05/2016 0807   BILITOT 0.6 01/05/2016 0807   GFRNONAA >60 01/05/2016 0807   GFRAA >60 01/05/2016 0807       Component Value Date/Time   WBC 8.9 12/21/2015 0256   WBC 6.0 12/20/2015 0405   WBC 12.5* 12/19/2015 0357   HGB 11.2* 12/21/2015 0256   HGB 10.7* 12/20/2015 0405   HGB 11.5* 12/19/2015 0357   HCT 33.8* 12/21/2015 0256   HCT 32.0* 12/20/2015 0405   HCT 34.8* 12/19/2015 0357   MCV 91.1 12/21/2015 0256   MCV 91.2 12/20/2015 0405   MCV 89.0 12/19/2015 0357    Lipid Panel     Component Value Date/Time   CHOL 93 01/05/2016 0807   TRIG 65 01/05/2016 0807   HDL 25* 01/05/2016 0807   CHOLHDL 3.7 01/05/2016 0807   VLDL 13 01/05/2016 0807   LDLCALC 55 01/05/2016 0807    ABG    Component Value Date/Time   PHART 7.412 12/18/2015 1232   PCO2ART 36.0 12/18/2015 1232   PO2ART 78.0* 12/18/2015 1232   HCO3 22.8 12/18/2015 1232   TCO2 21 12/18/2015 1702   ACIDBASEDEF 1.0 12/18/2015 1232   O2SAT 95.0 12/18/2015 1232     Lab Results  Component Value Date   TSH 1.552 12/13/2015   BNP (last 3 results)  Recent Labs  12/13/15 1645 01/05/16 0807  BNP 625.0* 590.0*    ProBNP (last 3 results) No results for input(s): PROBNP in the last 8760 hours.  Cardiac Panel (last 3 results) No results for input(s): CKTOTAL, CKMB, TROPONINI, RELINDX in the last 72 hours.  Iron/TIBC/Ferritin/ %Sat No results found for: IRON, TIBC, FERRITIN, IRONPCTSAT   EKG Orders placed or performed during the hospital encounter of 12/13/15  . EKG 12-Lead  . EKG 12-Lead  . EKG 12-Lead  . EKG 12-Lead  . 12 lead EKG  . 12 lead EKG  . EKG 12-Lead  . EKG 12-Lead  . EKG  . EKG 12-Lead  . EKG  12-Lead  . EKG 12-Lead  . EKG 12-Lead  . EKG 12-Lead  . 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 EXAM: CHEST  2 VIEW COMPARISON:  Single-view of the chest 12/20/2015 and 12/19/2015. FINDINGS: Right IJ sheath has been removed since yesterday's examination. There are small bilateral pleural effusions and mild basilar atelectasis. Heart size is upper normal. No pneumothorax. IMPRESSION: Improved small bilateral pleural effusions and basilar atelectasis. No new abnormality. Electronically Signed   By: Inge Rise M.D.   On: 12/21/2015 07:34   Dg Chest 2 View  12/16/2015  CLINICAL DATA:  Bilateral lung base crackles on physical examination. EXAM: CHEST  2 VIEW COMPARISON:  December 13, 2015 FINDINGS: There is mild bibasilar interstitial edema There is no airspace consolidation. Heart is slightly enlarged minent with pulmonary vascularity within normal limits. No adenopathy. No bone lesions. There is mild degenerative change in the thoracic spine. IMPRESSION: Mild bibasilar interstitial edema. Slight cardiomegaly. There is a probable degree of congestive heart failure. No airspace consolidation apparent. Electronically Signed   By: Lowella Grip III M.D.   On: 12/16/2015 19:02   Dg Chest Port 1  View  12/20/2015  CLINICAL DATA:  Status post CABG 3 days ago, chest soreness EXAM: PORTABLE CHEST 1 VIEW COMPARISON:  Portable chest x-ray of December 19, 2015 FINDINGS: The lungs are better inflated today. The interstitial markings are less prominent. There remain small bilateral pleural effusions and bibasilar atelectasis. The cardiac silhouette remains enlarged. The pulmonary vascularity has normalized. The mediastinal drain has been removed. The right internal jugular Cordis sheath is stable. The left atrial appendage clip and the sternal wires appear stable. IMPRESSION: Interval improvement in aeration of both lungs with persistent bibasilar atelectasis and trace pleural effusions decreased pulmonary vascular congestion. No pneumothorax is observed. Electronically Signed   By: David  Martinique M.D.   On: 12/20/2015 07:24   Dg Chest Port 1 View  12/19/2015  CLINICAL DATA:  Patient status post coronary artery bypass graft. EXAM: PORTABLE CHEST 1 VIEW COMPARISON:  Chest radiograph 12/18/2015. FINDINGS: Interval removal PA catheter with right IJ sheath projecting over the superior vena cava. Interval extubation and removal of enteric tube. Monitoring leads overlie the patient. Left chest tube and mediastinal drain remain in place. Stable cardiomegaly. Low lung volumes. Unchanged bilateral mid lower lung airspace opacities. Small layering bilateral pleural effusions. IMPRESSION: Interval removal of PA catheter, ETT and enteric tube. Stable cardiomegaly. Persistent mid and lower lung airspace opacities and small layering bilateral pleural effusions. Electronically Signed   By: Lovey Newcomer M.D.   On: 12/19/2015 08:25   Dg Chest Port 1 View  12/18/2015  CLINICAL DATA:  Atelectasis EXAM: PORTABLE CHEST 1 VIEW COMPARISON:  Chest radiograph from one day prior. FINDINGS: Endotracheal tube tip is 5.0 cm above the carina. Right internal jugular Swan-Ganz catheter terminates in the right pulmonary artery. Enteric tube  terminates in the proximal stomach. Left chest tube and mediastinal drain are stable in  position. Sternotomy wires appear aligned and intact. Stable cardiomediastinal silhouette with mild greater. No pneumothorax. No definite pleural effusion. Low lung volumes. Mild pulmonary edema appears stable. Bibasilar lung opacities appear slightly increased, favor atelectasis. IMPRESSION: 1. Support structures as described. 2. No pneumothorax. 3. Mild cardiomegaly and stable mild pulmonary edema. 4. Low lung volumes with slightly worsened bibasilar lung opacities favoring atelectasis. Electronically Signed   By: Ilona Sorrel M.D.   On: 12/18/2015 07:46   Dg Chest Port 1 View  12/17/2015  CLINICAL DATA:  Post CABG. EXAM: PORTABLE CHEST 1 VIEW COMPARISON:  Frontal and lateral views yesterday. FINDINGS: Post median sternotomy. Endotracheal tube at the thoracic inlet. Enteric tube in place, side-port just beyond the gastroesophageal junction. Left chest tube and mediastinal drain in place. Tip of the right Swan-Ganz catheter in the region of the main/right proximal pulmonary outflow tract. Cardiomegaly is similar to preoperative exam. There is increased interstitial edema. Small right pleural effusion. No pneumothorax. IMPRESSION: 1. Post CABG.  Support apparatus as described. 2. Increased pulmonary edema from preoperative exam. Small right pleural effusion. Electronically Signed   By: Jeb Levering M.D.   On: 12/17/2015 19:54   Dg Chest Port 1 View  12/13/2015  CLINICAL DATA:  Shortness of Breath and atrial fibrillation, history of tobacco use EXAM: PORTABLE CHEST 1 VIEW COMPARISON:  None. FINDINGS: Cardiac shadow is mildly enlarged. Mild vascular congestion is noted without focal infiltrate. No pulmonary edema is seen. No bony abnormality is noted. IMPRESSION: Mild vascular congestion. Electronically Signed   By: Inez Catalina M.D.   On: 12/13/2015 17:05

## 2016-01-06 NOTE — Progress Notes (Signed)
Cardiology Office Note   Date:  01/06/2016   ID:  [name] [name], [name] [name] [birth year], MRN [id number]  PCP:  Mickie Hillier, MD  Cardiologist: Woodroe Chen, NP   Chief Complaint  Patient presents with  . Coronary Artery Disease    S/P CABG  . PAD    Carotid Artery Right      History of Present Illness: [name] [name] is a 55 y.o. male who presents for post hospital follow up after initial consultation for chest pain while admitted to [facility]. He was also found at that time to be in atrial fibrillation with antero/lateral Q-waves. He was transferred to [facility] for cardiac catheterization.   Cardiac Cath 12/15/2015  There is moderate to severe left ventricular systolic dysfunction.  Ost LM to LM lesion, 70% stenosed.  Ost RCA lesion, 100% stenosed.  Mid LAD lesion, 100% stenosed.  Prox LAD to Mid LAD lesion, 90% stenosed.  Ost Cx to Prox Cx lesion, 70% stenosed.  Mid Cx lesion, 80% stenosed.  He was referred for CABG. On 12/17/2015 he underwent CABG x 3 utilizing LIMA to LADk SVG to PDA, and SVG to OM. He also underwent clipping of the LA Appendage and endoscopic harvest of the greater saphenous vein from the right thigh. Despite this the patient continued to have Atrial Fibrillation. He was again re bolused with IV Amiodarone and started on low dose Coumadin therapy. He later converted to NSR and his Amiodarone was converted to an oral regimen.  He continued to have issues with LEE and was advised to take extra doses of lasix for a few days. He is here for follow up.   He is here today feeling very well. He has lost 15 pounds, he stopped smoking, he is adhering to his medication regimen and diet, he is walking around the block once a day. His energy level is coming back. His only complaint is that his taste buds have not returned and he is having trouble tasting his food. He continues some soreness from his sternotomy but this is getting better. He has not been taking any pain  medications. He has some lower extremity edema for which she was taking Lasix but that is also resolving. He has not yet been seen by Dr.Owens or called by cardiac rehab.   He is due to have right carotid endarterectomy in a couple of months.    Past Medical History  Diagnosis Date  . GERD (gastroesophageal reflux disease)   . Tobacco abuse   . Atrial fibrillation with rapid ventricular response ([place]) 12/13/2015  . Cardiomyopathy ([place]) 12/13/2015  . NSTEMI (non-ST elevated myocardial infarction) ([place])   . Cardiomyopathy, ischemic 12/14/2015    EF 20-25%  . Mitral regurgitation 12/14/2015    moderate  . Acute systolic congestive heart failure ([place])   . Coronary artery disease involving native coronary artery with unstable angina pectoris ([place]) 12/15/2015  . Hypercholesterolemia 12/15/2015  . 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 Cardiac catheterization N/A 12/15/2015    Procedure: Right/Left Heart Cath and Coronary Angiography;  Surgeon: Wellington Hampshire, MD;  Location: [facility];  Service: Cardiovascular;  Laterality: N/A;  . Knee arthroscopy Bilateral   . Coronary artery bypass graft N/A 12/17/2015    Procedure: CORONARY ARTERY BYPASS  GRAFTING (CABG) x three , using left internal mammary artery and right leg greater saphenous vein;  Surgeon: [name], MD;  Location: [facility];  Service: Open Heart Surgery;  Laterality: N/A;  . Clipping of atrial appendage Left 12/17/2015    Procedure: CLIPPING OF ATRIAL APPENDAGE;  Surgeon: [name], MD;  Location: [facility];  Service: Open Heart Surgery;  Laterality: Left;     Current Outpatient Prescriptions  Medication Sig Dispense Refill  . acetaminophen (TYLENOL) 500 MG tablet Take 500 mg by mouth every 6 (six) hours as needed for mild pain.    Marland Kitchen amiodarone (PACERONE) 200 MG  tablet Take 1 tablet (200 mg total) by mouth 2 (two) times daily after a meal. 60 tablet 1  . aspirin EC 81 MG EC tablet Take 1 tablet (81 mg total) by mouth daily.    Marland Kitchen atorvastatin (LIPITOR) 80 MG tablet Take 1 tablet (80 mg total) by mouth daily at 6 PM. 30 tablet 3  . carvedilol (COREG) 6.25 MG tablet Take 1 tablet (6.25 mg total) by mouth 2 (two) times daily with a meal. 60 tablet 3  . furosemide (LASIX) 40 MG tablet Take 1 tablet (40 mg total) by mouth daily. 90 tablet 0  . lisinopril (PRINIVIL,ZESTRIL) 2.5 MG tablet Take 1 tablet (2.5 mg total) by mouth daily. 30 tablet 3  . ondansetron (ZOFRAN ODT) 4 MG disintegrating tablet Take 1 tablet (4 mg total) by mouth every 8 (eight) hours as needed for nausea or vomiting. 15 tablet 0  . potassium chloride SA (K-DUR,KLOR-CON) 20 MEQ tablet Take 1 tablet (20 mEq total) by mouth daily. 90 tablet 0  . traMADol (ULTRAM) 50 MG tablet Take 1-2 tablets (50-100 mg total) by mouth every 4 (four) hours as needed for moderate pain. 30 tablet 0  . warfarin (COUMADIN) 5 MG tablet Take 1 tablet (5 mg total) by mouth daily at 6 PM. 30 tablet 3   No current facility-administered medications for this visit.    Allergies:   Review of patient's allergies indicates no known allergies.    Social History:  The patient  reports that he has quit smoking. His smoking use included Cigarettes. He smoked 1.00 pack per day. He quit smokeless tobacco use about 3 weeks ago. He reports that he does not drink alcohol or use illicit drugs.   Family History:  The patient's family history includes Atrial fibrillation in his father; Heart attack in his paternal grandfather; Heart failure in his father.    ROS: All other systems are reviewed and negative. Unless otherwise mentioned in H&P    PHYSICAL EXAM: VS:  BP 108/72 mmHg  Pulse 63  Ht 6' (1.829 m)  Wt 215 lb (97.523 kg)  BMI 29.15 kg/m2  SpO2 99% , BMI Body mass index is 29.15 kg/(m^2). GEN: Well nourished, well  developed, in no acute distress HEENT: normal Neck: no JVD, soft carotid bruit, carotid bruits, or masses Cardiac:RRR; no murmurs, rubs, or gallops,no edema  Respiratory:  clear to auscultation bilaterally, normal work of breathing GI: soft, nontender, nondistended, + BS MS: no deformity or atrophyWell healed sternotomy and drain incisions. No evidence of bleeding, infection or evisceration. Well healed SVG site inside right leg. .  Skin: warm and dry, no rash Neuro:  Strength and sensation are intact Psych: euthymic mood, full affect  Recent Labs: 12/13/2015: TSH 1.552 12/18/2015: Magnesium 1.9 12/21/2015: Hemoglobin 11.2*; Platelets 218 01/05/2016: ALT 40; B Natriuretic Peptide 590.0*; BUN 18; Creatinine, Ser 1.11;  Potassium 5.2*; Sodium 139    Lipid Panel    Component Value Date/Time   CHOL 93 01/05/2016 0807   TRIG 65 01/05/2016 0807   HDL 25* 01/05/2016 0807   CHOLHDL 3.7 01/05/2016 0807   VLDL 13 01/05/2016 0807   LDLCALC 55 01/05/2016 0807      Wt Readings from Last 3 Encounters:  01/06/16 215 lb (97.523 kg)  12/30/15 221 lb (100.245 kg)  12/29/15 227 lb (102.967 kg)     ASSESSMENT AND PLAN:  1. CAD:S/P CABG X 3 with clipping of LA appendage: he is healing well, continues to have soreness that his energy level is returning. I have counseled him on doing too much too soon as he is wanting to go back to his normal activities and become more active quickly. I've advised him to take this at a time and not to overdo to prevent him becoming too fatigued and not able to progress. We will rerefer him to cardiac rehabilitation if he's not had a phone call from them.  He had some postoperative atrial fibrillation and was placed on amiodarone drip and transitioned to by mouth amiodarone. He is now on 200 mg twice a day and I'll decrease it to 200 mg daily. His Lasix will be changed to when necessary as he has no evidence of fluid retention. May need to have him take 20 mg daily should  he have recurrence of this. He is due to followup with Dr. Roxy Manns in a couple of weeks on January 17, 2016. He's been advised if he gains 3-4 pounds in 24 hours or 5 pounds in 3 days and he begins to have some lower extremity edema he is to go back on the Lasix 20 mg daily. No other medications changes are made.He'll continue carvedilol and statin therapy.  Multiple questions are answered. He'll be scheduled for a chest x-ray on 01/14/2016 in preparation for followup appointment with Dr.Owens. He wishes to stay within network to have chest x-ray completed  2. Hypertension:blood pressure is soft today. Changing amiodarone 2 daily and using Lasix as needed will be helpful and blood pressure. He is to continue take his blood pressure daily with his range between [id number] and [id number] systolic over [id number] diastolic.  3. Atrial fibrillation:heart rate is regular today. He continues on Coumadin therapy. Discontinuation of Coumadin therapy per Dr.Owens. As above, amiodarone will be decreased to 20 mg daily.  4. Systolic Dysfunction: secondary to coronary artery disease and ischemia.echocardiogram dated 12/14/2015 demonstrated EF of 20-25% with diffuse hypokinesis. We'll repeat echocardiogram in 3 months. Continue carvedilol 6.25 mg twice a day and lisinopril.  5.Tobacco abuse: he is status post a since June of 2017. He states that he no longer wishes to smoke and has no plans to restart.  6. Carotid Artery Disease: he has a right carotid bruit which is soft but has plans to proceed with right carotid endarterectomy, for evaluation during hospitalization.   Current medicines are reviewed at length with the patient today.    Labs/ tests ordered today include: Echocardiogram No orders of the defined types were placed in this encounter.     Disposition:   FU with 3 months. Signed, Jory Sims, NP  01/06/2016 4:15 PM    [facility] [address], [place], [place] [address] Phone: [phone]; Fax: [phone]

## 2016-01-11 ENCOUNTER — Telehealth: Payer: Self-pay | Admitting: Family Medicine

## 2016-01-11 NOTE — Telephone Encounter (Signed)
Form in your office in folder

## 2016-01-11 NOTE — Telephone Encounter (Signed)
Pt dropped off a handicap placard form to be filled out. Form in nurse box.

## 2016-01-14 ENCOUNTER — Ambulatory Visit (HOSPITAL_COMMUNITY)
Admission: RE | Admit: 2016-01-14 | Discharge: 2016-01-14 | Disposition: A | Discharge status: Home / Self Care | Payer: 59 | Source: Ambulatory Visit | Attending: Adult Health | Admitting: Adult Health

## 2016-01-14 ENCOUNTER — Other Ambulatory Visit: Payer: Self-pay | Admitting: Thoracic Surgery (Cardiothoracic Vascular Surgery)

## 2016-01-14 DIAGNOSIS — Z951 Presence of aortocoronary bypass graft: Secondary | ICD-10-CM | POA: Diagnosis not present

## 2016-01-14 DIAGNOSIS — Z9889 Other specified postprocedural states: Secondary | ICD-10-CM | POA: Insufficient documentation

## 2016-01-14 DIAGNOSIS — R918 Other nonspecific abnormal finding of lung field: Secondary | ICD-10-CM | POA: Diagnosis not present

## 2016-01-17 ENCOUNTER — Ambulatory Visit (INDEPENDENT_AMBULATORY_CARE_PROVIDER_SITE_OTHER): Payer: 59 | Admitting: *Deleted

## 2016-01-17 ENCOUNTER — Ambulatory Visit (INDEPENDENT_AMBULATORY_CARE_PROVIDER_SITE_OTHER): Payer: Self-pay | Admitting: Thoracic Surgery (Cardiothoracic Vascular Surgery)

## 2016-01-17 ENCOUNTER — Encounter: Payer: Self-pay | Admitting: Thoracic Surgery (Cardiothoracic Vascular Surgery)

## 2016-01-17 VITALS — BP 140/90 | HR 76 | Resp 20 | Ht 72.0 in | Wt 215.0 lb

## 2016-01-17 DIAGNOSIS — I4891 Unspecified atrial fibrillation: Secondary | ICD-10-CM | POA: Diagnosis not present

## 2016-01-17 DIAGNOSIS — Z951 Presence of aortocoronary bypass graft: Secondary | ICD-10-CM

## 2016-01-17 DIAGNOSIS — Z5181 Encounter for therapeutic drug level monitoring: Secondary | ICD-10-CM | POA: Diagnosis not present

## 2016-01-17 LAB — POCT INR: INR: 2

## 2016-01-17 MED ORDER — ASPIRIN 81 MG PO TBEC: 81.0000 mg | DELAYED_RELEASE_TABLET | Freq: Every day | ORAL | Status: DC

## 2016-01-17 MED ORDER — LISINOPRIL 2.5 MG PO TABS: 2.5000 mg | ORAL_TABLET | Freq: Every day | ORAL | Status: DC

## 2016-01-17 MED ORDER — ATORVASTATIN CALCIUM 80 MG PO TABS: 80.0000 mg | ORAL_TABLET | Freq: Every day | ORAL | Status: DC

## 2016-01-17 MED ORDER — CARVEDILOL 6.25 MG PO TABS: 6.2500 mg | ORAL_TABLET | Freq: Two times a day (BID) | ORAL | Status: DC

## 2016-01-17 NOTE — Progress Notes (Signed)
[address]       [place] [address]             [phone]     CARDIOTHORACIC SURGERY OFFICE NOTE  Referring Provider is Dorothy Spark, MD PCP is Mickie Hillier, MD   HPI:  Patient is a 55 year old male who returns to the office today for routine follow-up status post coronary artery bypass grafting 3 with clipping of the left atrial appendage on 12/17/2015 for severe three-vessel coronary artery disease with unstable angina pectoris, acute on chronic systolic congestive heart failure, ischemic cardiomyopathy, and new onset paroxysmal atrial fibrillation. The patient's early postoperative recovery was notable for the development of recurrent persistent atrial fibrillation. He otherwise did quite well. He was discharged home on the fifth postoperative day in stable rate controlled atrial fibrillation on amiodarone with warfarin anticoagulation.  Since hospital discharge the patient has done well. He has had his prothrombin time checked and Coumadin dose adjusted through the Coumadin clinic. He was seen in follow-up by Janalyn Shy at [facility] on 01/06/2016. He has also been seen in follow-up by his primary care physician. He returns to our office for routine follow-up today. He reports that he is doing exceptionally well. He is walking 4-5 miles every day without any difficulty. He no longer has any significant pain or soreness in his chest. He reports excellent exercise tolerance and he notes that his breathing is better than it was prior to surgery. He has not been smoking cigarettes. He has not had any palpitations or dizzy spells. Overall he is delighted with his progress.   Current Outpatient Prescriptions  Medication Sig Dispense Refill  . amiodarone (PACERONE) 200 MG tablet Take 1 tablet (200 mg total) by mouth daily. 90 tablet 3  . aspirin 81 MG EC tablet Take 1 tablet (81 mg total) by mouth daily. 30 tablet   . atorvastatin (LIPITOR) 80 MG tablet  Take 1 tablet (80 mg total) by mouth daily at 6 PM. 30 tablet 3  . carvedilol (COREG) 6.25 MG tablet Take 1 tablet (6.25 mg total) by mouth 2 (two) times daily with a meal. 60 tablet 3  . furosemide (LASIX) 40 MG tablet Take 1 tablet (40 mg total) by mouth as needed. 90 tablet 3  . lisinopril (PRINIVIL,ZESTRIL) 2.5 MG tablet Take 1 tablet (2.5 mg total) by mouth daily. 30 tablet 3  . potassium chloride SA (K-DUR,KLOR-CON) 20 MEQ tablet Take 1 tablet (20 mEq total) by mouth daily. (Patient taking differently: Take 20 mEq by mouth daily. As needed) 90 tablet 0  . warfarin (COUMADIN) 5 MG tablet Take 1 tablet (5 mg total) by mouth daily at 6 PM. 30 tablet 3   No current facility-administered medications for this visit.      Physical Exam:   BP 140/90 mmHg  Pulse 76  Resp 20  Ht 6' (1.829 m)  Wt 215 lb (97.523 kg)  BMI 29.15 kg/m2  SpO2 99%  General:  Well appearing  Chest:   Clear to auscultation  CV:   Regular rate and rhythm  Incisions:  Healing nicely, sternum is stable  Abdomen:  Soft and nontender  Extremities:  Warm and well-perfused  Diagnostic Tests:  CHEST 2 VIEW  COMPARISON: 12/21/2015  FINDINGS: Upper normal size of cardiac silhouette post CABG and LEFT atrial appendage clipping.  Mediastinal contours and pulmonary vascularity normal.  Lungs clear.  Resolution of small bibasilar effusions and basilar atelectasis seen on previous  exam.  No infiltrate or pneumothorax.  Osseous structures unremarkable.  IMPRESSION: Post CABG and LEFT atrial appendage clipping.  Improved bibasilar aeration.   Electronically Signed  By: Lavonia Dana M.D.  On: 01/14/2016 08:41   Impression:  Patient is doing very well approximately one month status post coronary artery bypass grafting. Although he has not had an EKG performed, he appears to be maintaining sinus rhythm. Clinically he is doing exceptionally well.  Plan:  I have instructed the patient to  decrease his dose of amiodarone to 100 mg daily. One his current prescription runs out he will stop taking it altogether. We have not recommended any other changes to his current medications. It might be reasonable to stop warfarin if the patient remains in sinus rhythm over the next 6-8 weeks. I have encouraged the patient to continue to gradually increase his activity as tolerated with his primary limitation remaining that he refrain from heavy lifting or strenuous use of his arms or shoulders for another 2 months. We have discussed a plan for him to return to work. He may resume driving an automobile at this point. He has been encouraged to enroll and participate in the outpatient cardiac rehabilitation program. All of his questions have been addressed.    Valentina Gu. Roxy Manns, MD 01/17/2016 12:40 PM

## 2016-01-17 NOTE — Patient Instructions (Signed)
Continue to avoid any heavy lifting or strenuous use of your arms or shoulders for at least a total of three months from the time of surgery.  After three months you may gradually increase how much you lift or otherwise use your arms or chest as tolerated, with limits based upon whether or not activities lead to the return of significant discomfort.  You may return to driving an automobile as long as you are no longer requiring oral narcotic pain relievers during the daytime.  It would be wise to start driving only short distances during the daylight and gradually increase from there as you feel comfortable.  You are encouraged to enroll and participate in the outpatient cardiac rehab program beginning as soon as practical.  Decrease your dose of amiodarone to 100 mg (1/2) tablet daily until you run out.  Do not refill this prescription.  Continue all other previous medications without any changes at this time

## 2016-01-20 ENCOUNTER — Encounter: Payer: Self-pay | Admitting: Surgery

## 2016-01-24 ENCOUNTER — Other Ambulatory Visit: Payer: Self-pay

## 2016-01-24 ENCOUNTER — Encounter: Payer: Self-pay | Admitting: Surgery

## 2016-01-24 ENCOUNTER — Ambulatory Visit (INDEPENDENT_AMBULATORY_CARE_PROVIDER_SITE_OTHER): Payer: 59 | Admitting: Surgery

## 2016-01-24 VITALS — BP 138/84 | HR 62 | Temp 97.2°F | Resp 16 | Wt 220.0 lb

## 2016-01-24 DIAGNOSIS — I6522 Occlusion and stenosis of left carotid artery: Secondary | ICD-10-CM

## 2016-01-24 NOTE — Progress Notes (Signed)
[facility] [place]  Patient name: JOHNATHN SCHERZER MRN: [id number] DOB: 1961-01-05 Sex: male  REASON FOR VISIT: follow up  HPI: This is a 55 year old maleWho presented to the hospital on 12/13/2015 with increased heart rate and abnormal EKG.  He states that he has been having right flank pain for several weeks which has become progressively more towards the epigastrium.  He also began complaining of shortness of breath, leg swelling, orthopnea and intermittent chest pain.  His troponins were elevated.  He underwent cardiac catheterization which revealed severe multivessel disease.  His ejection fraction was approximately 20-25 percent.  He did have significant fluid weight gain which has improved with diuresis.  His symptoms have improved.  He has been scheduled for coronary bypass.  During his pre-CABG workup he was found to have a high-grade left carotid stenosis on ultrasound.  The patient is asymptomatic.  Specifically, he denies numbness or weakness in either extremity.  He denies slurred speech.  He denies amaurosis fugax.  The patient suffers from long-standing tobacco abuse.  He is now on a statin for hypercholesterolemia. He did go into A. fib with rapid ventricular response which converted with diltiazem back into sinus rhythm.  He underwent CABG approximately one month ago.  He has done very well and is recovering nicely.  He is on Coumadin for atrial fibrillation.  He denies neurologic symptoms since his CABG.  Past Medical History:  Diagnosis Date  . Acute systolic congestive heart failure ([place])   . Atrial fibrillation with rapid ventricular response ([place]) 12/13/2015  . Cardiomyopathy ([place]) 12/13/2015  . Cardiomyopathy, ischemic 12/14/2015   EF 20-25%  . Coronary artery disease involving native coronary artery with unstable angina pectoris ([place]) 12/15/2015  . GERD (gastroesophageal reflux disease)   . Hypercholesterolemia  12/15/2015  . Mitral regurgitation 12/14/2015   moderate  . NSTEMI (non-ST elevated myocardial infarction) ([place])   . 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 Tobacco abuse     Family History  Problem Relation Age of Onset  . Heart attack Paternal Grandfather   . Heart failure Father   . Atrial fibrillation Father     SOCIAL HISTORY: Social History  Substance Use Topics  . Smoking status: Former Smoker    Packs/day: 1.00    Types: Cigarettes  . Smokeless tobacco: Former Systems developer    Quit date: 12/13/2015  . Alcohol use No    No Known Allergies  Current Outpatient Prescriptions  Medication Sig Dispense Refill  . aspirin 81 MG EC tablet Take 1 tablet (81 mg total) by mouth daily. 30 tablet   . atorvastatin (LIPITOR) 80 MG tablet Take 1 tablet (80 mg total) by mouth daily at 6 PM. 30 tablet 3  . carvedilol (COREG) 6.25 MG tablet Take 1 tablet (6.25 mg total) by mouth 2 (two) times daily with a meal. 60 tablet 3  . furosemide (LASIX) 40 MG tablet Take 1 tablet (40 mg total) by mouth as needed. 90 tablet 3  . lisinopril (PRINIVIL,ZESTRIL) 2.5 MG tablet Take 1 tablet (2.5 mg total) by mouth daily. 30 tablet 3  . potassium chloride SA (K-DUR,KLOR-CON) 20 MEQ tablet Take 1 tablet (20 mEq total) by mouth daily. (Patient taking differently: Take 20 mEq by mouth daily. As needed) 90 tablet 0  . warfarin (COUMADIN) 5 MG tablet Take  1 tablet (5 mg total) by mouth daily at 6 PM. 30 tablet 3   No current facility-administered medications for this visit.     REVIEW OF SYSTEMS:  [X]  denotes positive finding, [ ]  denotes negative finding Cardiac  Comments:  Chest pain or chest pressure:    Shortness of breath upon exertion:    Short of breath when lying flat:    Irregular heart rhythm:        Vascular    Pain in calf, thigh, or hip brought on by ambulation:    Pain in feet at night that  wakes you up from your sleep:     Blood clot in your veins:    Leg swelling:         Pulmonary    Oxygen at home:    Productive cough:     Wheezing:         Neurologic    Sudden weakness in arms or legs:     Sudden numbness in arms or legs:     Sudden onset of difficulty speaking or slurred speech:    Temporary loss of vision in one eye:     Problems with dizziness:         Gastrointestinal    Blood in stool:     Vomited blood:         Genitourinary    Burning when urinating:     Blood in urine:        Psychiatric    Major depression:         Hematologic    Bleeding problems:    Problems with blood clotting too easily:        Skin    Rashes or ulcers:        Constitutional    Fever or chills:      PHYSICAL EXAM: Vitals:   01/24/16 0911 01/24/16 0922  BP: 130/82 138/84  Pulse: 62   Resp: 16   Temp: 97.2 F (36.2 C)   TempSrc: Oral   SpO2: 99%   Weight: 220 lb (99.8 kg)     GENERAL: The patient is a well-nourished male, in no acute distress. The vital signs are documented above. CARDIAC: There is a regular rate and rhythm.  VASCULAR: Bilateral carotid bruit PULMONARY: There is good air exchange bilaterally without wheezing or rales. ABDOMEN: Soft and non-tender with normal pitched bowel sounds.  No pulsatile mass MUSCULOSKELETAL: There are no major deformities or cyanosis. NEUROLOGIC: No focal weakness or paresthesias are detected. SKIN: There are no ulcers or rashes noted. PSYCHIATRIC: The patient has a normal affect.  DATA:  Carotid ultrasound was reviewed again.  This shows greater than 80% left carotid stenosis  MEDICAL ISSUES: Asymptomatic left carotid stenosis: We discussed proceeding with left carotid endarterectomy on Wednesday, August 9.  The risks and benefits of the operation were discussed with the patient which include but are not limited to stroke, bleeding, nerve injury, and facial numbness.  All of his questions were answered.  I will get  formal clearance to proceed with surgery from Dr. Roxy Manns.  In addition, he will need to be off of his Coumadin prior to surgery.    Annamarie Major, MD [facility] [phone] Pager [phone]

## 2016-01-25 ENCOUNTER — Encounter (HOSPITAL_COMMUNITY): Payer: Self-pay | Admitting: *Deleted

## 2016-01-26 ENCOUNTER — Telehealth: Payer: Self-pay | Admitting: Family Medicine

## 2016-01-26 NOTE — Telephone Encounter (Signed)
ok 

## 2016-01-26 NOTE — Telephone Encounter (Signed)
Just fyi, the patient will be having his secondary surgery on the 9th of Aug

## 2016-01-28 ENCOUNTER — Ambulatory Visit (INDEPENDENT_AMBULATORY_CARE_PROVIDER_SITE_OTHER): Payer: 59 | Admitting: Family Medicine

## 2016-01-28 ENCOUNTER — Encounter: Payer: Self-pay | Admitting: Family Medicine

## 2016-01-28 VITALS — BP 126/72 | Ht 70.0 in | Wt 220.8 lb

## 2016-01-28 DIAGNOSIS — I509 Heart failure, unspecified: Secondary | ICD-10-CM

## 2016-01-28 DIAGNOSIS — I10 Essential (primary) hypertension: Secondary | ICD-10-CM

## 2016-01-28 DIAGNOSIS — R7301 Impaired fasting glucose: Secondary | ICD-10-CM

## 2016-01-28 DIAGNOSIS — I481 Persistent atrial fibrillation: Secondary | ICD-10-CM | POA: Diagnosis not present

## 2016-01-28 DIAGNOSIS — I4819 Other persistent atrial fibrillation: Secondary | ICD-10-CM

## 2016-01-28 DIAGNOSIS — E785 Hyperlipidemia, unspecified: Secondary | ICD-10-CM | POA: Diagnosis not present

## 2016-01-28 NOTE — Progress Notes (Signed)
   Subjective:    Patient ID: [name] [name], male    DOB: 02-06-1961, 55 y.o.   MRN: [id number]  HPI Patient arrives for a follow up on recent hospitalization. Patient is due to have surgery on blocked carotid in early August.  Pt working on diet , has cut down ssg intake of sodium and chol and packaged meat etc.  See below  Smoking now in rear vies  mrri  Review of Systems No headache, no major weight loss or weight gain, no chest pain no back pain abdominal pain no change in bowel habits complete ROS otherwise negative     Objective:   Physical Exam   Alert vitals stable, NAD. Blood pressure good on repeat. HEENT normal. Lungs clear. Heart regular rate and rhythm.      Assessment & Plan:    Patient reports less shortness of breath now. Compliant with medication. Blood pressure good when checked elsewhere.  Due to have carotid surgery shortly. No focal neurological symptoms at this time.  Impression 1 atrial fibrillation now resolved back into apparent normal sinus rhythm on exam. Still on anticoagulation. This is discussed at length family has at least a dozen questions that are responded to. Useful to maintain anticoagulation for now with known carotid stenosis and the fact that paroxysmal atrial fibrillation can still occur. #2 congestive heart failure clinically much improved discussed #3 coronary artery disease discussed. #4 probable residual symptoms from smoking discussed at length. #5 hyperlipidemia and discuss along with many questions regarding side effects and benefits. #6 carotid stenosis multiple questions answered. #7 impaired fasting glucose discussed. Patient is truly prediabetes. We'll need to assess after he tightens up his diet. [place] decision the remove anticoagulation will have to rely with the specialist. Discussed. Patient continues remain away from smoking. Continues to gradually improve ambulation. Curiously the patient states we did not fully explain some  prior issues and all that has gone on the past that is occurred over the last couple months. Rediscussion of all underlying issues occurred. 40 minutes spent in the office most in discussion. We'll follow-up as scheduled. Anticipate blood work at that time. WSL

## 2016-01-31 ENCOUNTER — Ambulatory Visit (INDEPENDENT_AMBULATORY_CARE_PROVIDER_SITE_OTHER): Payer: 59 | Admitting: *Deleted

## 2016-01-31 DIAGNOSIS — I4891 Unspecified atrial fibrillation: Secondary | ICD-10-CM | POA: Diagnosis not present

## 2016-01-31 DIAGNOSIS — Z5181 Encounter for therapeutic drug level monitoring: Secondary | ICD-10-CM | POA: Diagnosis not present

## 2016-01-31 LAB — POCT INR: INR: 2

## 2016-02-01 ENCOUNTER — Encounter: Payer: Self-pay | Admitting: Thoracic Surgery (Cardiothoracic Vascular Surgery)

## 2016-02-01 ENCOUNTER — Ambulatory Visit (INDEPENDENT_AMBULATORY_CARE_PROVIDER_SITE_OTHER): Payer: Self-pay | Admitting: Thoracic Surgery (Cardiothoracic Vascular Surgery)

## 2016-02-01 VITALS — BP 129/84 | HR 84 | Resp 20 | Ht 70.0 in | Wt 223.0 lb

## 2016-02-01 DIAGNOSIS — Z951 Presence of aortocoronary bypass graft: Secondary | ICD-10-CM

## 2016-02-01 NOTE — Patient Instructions (Addendum)
Continue all previous medications without any changes at this time  Patient needs 12 lead EKG done prior to surgery to make certain he remains in sinus rhythm

## 2016-02-01 NOTE — Progress Notes (Signed)
[address]       [place] [address]             [phone]     CARDIOTHORACIC SURGERY OFFICE NOTE  Referring Provider is Dorothy Spark, MD  Primary Cardiologist is Kate Sable, MD PCP is Mickie Hillier, MD   HPI:  Patient returns to the office today for preoperative clearance requested by Dr. Trula Slade for planned left carotid endarterectomy. He recently underwent coronary artery bypass grafting 3 with clipping of the left atrial appendage on 12/17/2015 for severe three-vessel coronary artery disease with unstable angina pectoris, acute on chronic systolic congestive heart failure, ischemic cardiomyopathy, and new onset paroxysmal atrial fibrillation. His postoperative recovery was uneventful although the patient did develop recurrent atrial fibrillation which persisted. He was ultimately discharged home in rate controlled atrial fibrillation on amiodarone with warfarin anticoagulation.  He was seen in follow-up by Bunnie Domino at [facility] on 01/06/2016.  An EKG was not done at that time. More recently he was seen here in our office on 01/17/2016 at which time he was noted to have a regular pulse on physical exam. Clinically the patient has done remarkably well. He is now walking more than 6 miles a day every day. He no longer has any significant pain in his chest. He reports no significant shortness of breath either with activity or at rest. He is monitoring his weight on a daily basis. This has been fairly stable. He has had some mild lower extremity edema.  He has been scheduled for elective left carotid endarterectomy by Dr. Trula Slade next week for severe asymptomatic left internal carotid artery stenosis. He hopes to get this done as soon as possible so that he can do back to work.   Current Outpatient Prescriptions  Medication Sig Dispense Refill  . aspirin 81 MG EC tablet Take 1 tablet (81 mg total) by mouth daily. 30 tablet   . atorvastatin  (LIPITOR) 80 MG tablet Take 1 tablet (80 mg total) by mouth daily at 6 PM. 30 tablet 3  . carvedilol (COREG) 6.25 MG tablet Take 1 tablet (6.25 mg total) by mouth 2 (two) times daily with a meal. 60 tablet 3  . furosemide (LASIX) 40 MG tablet Take 1 tablet (40 mg total) by mouth as needed. 90 tablet 3  . lisinopril (PRINIVIL,ZESTRIL) 2.5 MG tablet Take 1 tablet (2.5 mg total) by mouth daily. 30 tablet 3  . potassium chloride SA (K-DUR,KLOR-CON) 20 MEQ tablet Take 1 tablet (20 mEq total) by mouth daily. (Patient taking differently: Take 20 mEq by mouth daily. As needed) 90 tablet 0  . warfarin (COUMADIN) 5 MG tablet Take 1 tablet (5 mg total) by mouth daily at 6 PM. 30 tablet 3   No current facility-administered medications for this visit.       Physical Exam:   BP 129/84 (BP Location: Left Arm, Patient Position: Sitting, Cuff Size: Normal)   Pulse 84   Resp 20   Ht 5\' 10"  (1.778 m)   Wt 223 lb (101.2 kg)   SpO2 98% Comment: RA  BMI 32.00 kg/m   General:  Well-appearing  Chest:   Clear to auscultation  CV:   Regular rate and rhythm without murmur  Incisions:  Healing nicely, sternum is stable  Abdomen:  Soft nontender  Extremities:  Warm and well-perfused  Diagnostic Tests:  n/a   Impression:  Patient is doing remarkably well more than 6 weeks status post coronary  artery bypass grafting 3 with clipping of left atrial appendage. From a surgical standpoint I see no reason why the patient should not proceed with elective left carotid endarterectomy.  Given that the patient recently had surgical revascularization for his underlying severe multivessel coronary artery disease and his current physical activity is remarkably good with no associated symptoms of exertional shortness of breath or chest pain, preoperative stress testing is probably not necessary. However, such a decision would best be made by the patient's primary cardiologist. Similarly, I would defer the decision regarding  whether or not the patient should resume Coumadin following his carotid endarterectomy to his primary cardiologist.  He presented with atrial fibrillation prior to his recent coronary artery bypass grafting and developed recurrent atrial fibrillation post operatively. He appears to be maintaining sinus rhythm at this time but he has not had an EKG performed.  His CHADS-VASC score is 3 at the most and his left atrial appendage has been clipped.   Plan:  I agree with plans to proceed with elective left carotid endarterectomy as outlined previously by Dr. Trula Slade. The patient should undergo 12-lead EKG prior to surgery and have pre-op cardiology evaluation if any concerns are noted.  I would defer the decision as to whether or not to restart warfarin postoperatively to the patient's primary cardiologist, Dr. Bronson Ing.    Valentina Gu. Roxy Manns, MD 02/01/2016 4:27 PM

## 2016-02-03 ENCOUNTER — Encounter: Payer: Self-pay | Admitting: Cardiovascular Disease

## 2016-02-03 ENCOUNTER — Ambulatory Visit (INDEPENDENT_AMBULATORY_CARE_PROVIDER_SITE_OTHER): Payer: 59 | Admitting: Cardiovascular Disease

## 2016-02-03 VITALS — BP 128/84 | HR 72 | Ht 72.0 in | Wt 220.0 lb

## 2016-02-03 DIAGNOSIS — I480 Paroxysmal atrial fibrillation: Secondary | ICD-10-CM

## 2016-02-03 DIAGNOSIS — I25709 Atherosclerosis of coronary artery bypass graft(s), unspecified, with unspecified angina pectoris: Secondary | ICD-10-CM

## 2016-02-03 DIAGNOSIS — I5042 Chronic combined systolic (congestive) and diastolic (congestive) heart failure: Secondary | ICD-10-CM | POA: Diagnosis not present

## 2016-02-03 DIAGNOSIS — I10 Essential (primary) hypertension: Secondary | ICD-10-CM | POA: Diagnosis not present

## 2016-02-03 DIAGNOSIS — Z01818 Encounter for other preprocedural examination: Secondary | ICD-10-CM

## 2016-02-03 DIAGNOSIS — I519 Heart disease, unspecified: Secondary | ICD-10-CM

## 2016-02-03 DIAGNOSIS — Z951 Presence of aortocoronary bypass graft: Secondary | ICD-10-CM

## 2016-02-03 NOTE — Progress Notes (Signed)
SUBJECTIVE: The patient presents for preoperative risk stratification prior to undergoing left carotid endarterectomy. He underwent three-vessel CABG on 12/17/15. He had persistent atrial fibrillation postoperatively and was started on amiodarone and warfarin. He walks more than 6 miles daily. He denies exertional chest pain and dyspnea. Preoperatively had severe systolic dysfunction, EF 0000000. Wants to come off of warfarin. Has several questions about his medications as well as his cardiovascular disease.  Review of Systems: As per "subjective", otherwise negative.  No Known Allergies  Current Outpatient Prescriptions  Medication Sig Dispense Refill  . aspirin 81 MG EC tablet Take 1 tablet (81 mg total) by mouth daily. 30 tablet   . atorvastatin (LIPITOR) 80 MG tablet Take 1 tablet (80 mg total) by mouth daily at 6 PM. 30 tablet 3  . carvedilol (COREG) 6.25 MG tablet Take 1 tablet (6.25 mg total) by mouth 2 (two) times daily with a meal. 60 tablet 3  . furosemide (LASIX) 40 MG tablet Take 1 tablet (40 mg total) by mouth as needed. 90 tablet 3  . lisinopril (PRINIVIL,ZESTRIL) 2.5 MG tablet Take 1 tablet (2.5 mg total) by mouth daily. 30 tablet 3  . potassium chloride SA (K-DUR,KLOR-CON) 20 MEQ tablet Take 1 tablet (20 mEq total) by mouth daily. (Patient taking differently: Take 20 mEq by mouth daily. As needed) 90 tablet 0  . warfarin (COUMADIN) 5 MG tablet Take 1 tablet (5 mg total) by mouth daily at 6 PM. (Patient taking differently: Take 5-7.5 mg by mouth See admin instructions. 5 mg on Sun, Tues, Thurs. 7.5 mg Mon, Wed, Fri, Sat) 30 tablet 3   No current facility-administered medications for this visit.     Past Medical History:  Diagnosis Date  . Acute systolic congestive heart failure ([place])   . Atrial fibrillation with rapid ventricular response ([place]) 12/13/2015  . Cardiomyopathy ([place]) 12/13/2015  . Cardiomyopathy, ischemic 12/14/2015   EF 20-25%  . Coronary artery disease  involving native coronary artery with unstable angina pectoris ([place]) 12/15/2015  . GERD (gastroesophageal reflux disease)   . Hypercholesterolemia 12/15/2015  . Mitral regurgitation 12/14/2015   moderate  . NSTEMI (non-ST elevated myocardial infarction) ([place])   . 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 Tobacco abuse     Past Surgical History:  Procedure Laterality Date  . CARDIAC CATHETERIZATION N/A 12/15/2015   Procedure: Right/Left Heart Cath and Coronary Angiography;  Surgeon: Wellington Hampshire, MD;  Location: [facility];  Service: Cardiovascular;  Laterality: N/A;  . CLIPPING OF ATRIAL APPENDAGE Left 12/17/2015   Procedure: CLIPPING OF ATRIAL APPENDAGE;  Surgeon: [name], MD;  Location: [facility];  Service: Open Heart Surgery;  Laterality: Left;  . CORONARY ARTERY BYPASS GRAFT N/A 12/17/2015   Procedure: CORONARY ARTERY BYPASS GRAFTING (CABG) x three , using left internal mammary artery and right leg greater saphenous vein;  Surgeon: [name], MD;  Location: [facility];  Service: Open Heart Surgery;  Laterality: N/A;  . KNEE ARTHROSCOPY Bilateral     Social History   Social History  . Marital status: Married    Spouse name: N/A  . Number of children: N/A  . Years of education: N/A   Occupational History  . [employer]    [employer]   Social History Main Topics  . Smoking status: Former Smoker  Packs/day: 1.00    Types: Cigarettes  . Smokeless tobacco: Former Systems developer    Quit date: 12/13/2015  . Alcohol use No  . Drug use: No  . Sexual activity: Yes   Other Topics Concern  . Not on file   Social History Narrative  . No narrative on file     Vitals:   02/03/16 0855  BP: 128/84  Pulse: 72  SpO2: 98%  Weight: 220 lb (99.8 kg)  Height: 6' (1.829 m)    PHYSICAL EXAM General: NAD HEENT: Normal. Neck: No JVD, no thyromegaly. Lungs: Diminished  throughout, no rales/wheezes. CV: Nondisplaced PMI.  Regular rate and rhythm, normal S1/S2, no S3/S4, no murmur. No pretibial or periankle edema.  Left carotid bruit.   Abdomen: Soft, nontender, no distention.  Neurologic: Alert and oriented.  Psych: Normal affect. Skin: Normal. Musculoskeletal: No gross deformities.    ECG: Most recent ECG reviewed.      ASSESSMENT AND PLAN: 1. Preop risk stratification:Can proceed with carotid endarterectomy without noninvasive testing. He is at least at an intermediate risk for major adverse cardiac events in the perioperative period given his severely reduced left ventricular systolic function. However, he has excellent exercise tolerance portending a more favorable outcome with respect to surgery.  2. CAD/CABG: Symptomatically stable. No changes to medical therapy.  3. Chronic systolic heart failure: Now that he has undergone successful revascularization, echocardiogram will need to be repeated within next several months. He appears to be euvolemic and stable from this standpoint. Continue Coreg and lisinopril. Takes Lasix prn.  4. Atrial fibrillation: Remains on warfarin. I think this was post-operative, and will dc warfarin. He is in a regular rhythm.   5. Essential HTN: Controlled. No changes.  Dispo: fu late November. Echo beforehand.  Time spent: 40 minutes, of which greater than 50% was spent reviewing symptoms, relevant blood tests and studies, and discussing management plan with the patient.   Kate Sable, M.D., F.A.C.C.

## 2016-02-03 NOTE — Patient Instructions (Signed)
Medication Instructions:  STOP COUMADIN  Labwork: NONE  Testing/Procedures: Your physician has requested that you have an echocardiogram. Echocardiography is a painless test that uses sound waves to create images of your heart. It provides your doctor with information about the size and shape of your heart and how well your heart's chambers and valves are working. This procedure takes approximately one hour. There are no restrictions for this procedure.    Follow-Up: Your physician recommends that you schedule a follow-up appointment in: 4 MONTHS    Any Other Special Instructions Will Be Listed Below (If Applicable).     If you need a refill on your cardiac medications before your next appointment, please call your pharmacy.

## 2016-02-04 NOTE — Pre-Procedure Instructions (Addendum)
    [name] [name]  02/04/2016      [place], [place] - [address] [facility]. [address] [place] [address] Phone: [phone] Fax: [phone]    Your procedure is scheduled on Wednesday, August 9.  Report to [facility] Admitting at 6:30 A.M.   Call this number if you have problems the morning of [phone]   Remember:  Do not eat food or drink liquids after midnight Tuesday, August 8.  Take these medicines the morning of surgery with A SIP OF WATER :aspirin,  carvedilol (COREG).   Do not wear jewelry.  Do not wear lotions, powders, or colognes.    Men may shave face and neck.  Do not bring valuables to the hospital.  [facility] is not responsible for any belongings or valuables.  Contacts, dentures or bridgework may not be worn into surgery.  Leave your suitcase in the car.  After surgery it may be brought to your room.  For patients admitted to the hospital, discharge time will be determined by your treatment team.   Special instructions:  Preparing for Surgery.   Please read over the following fact sheets that you were given.  Patient Instructions for Mupirocin Application    [facility]- Preparing For Surgery  Before surgery, you can play an important role. Because skin is not sterile, your skin needs to be as free of germs as possible. You can reduce the number of germs on your skin by washing with CHG (chlorahexidine gluconate) Soap before surgery.  CHG is an antiseptic cleaner which kills germs and bonds with the skin to continue killing germs even after washing.  Please do not use if you have an allergy to CHG or antibacterial soaps. If your skin becomes reddened/irritated stop using the CHG.  Do not shave (including legs and underarms) for at least 48 hours prior to first CHG shower. It is OK to shave your face.  Please follow these instructions carefully.   1. Shower the NIGHT BEFORE SURGERY  and the MORNING OF SURGERY with CHG.   2. If you chose to wash your hair, wash your hair first as usual with your normal shampoo.  3. After you shampoo, rinse your hair and body thoroughly to remove the shampoo.  4. Use CHG as you would any other liquid soap. You can apply CHG directly to the skin and wash gently with a scrungie or a clean washcloth.   5. Apply the CHG Soap to your body ONLY FROM THE NECK DOWN.  Do not use on open wounds or open sores. Avoid contact with your eyes, ears, mouth and genitals (private parts). Wash genitals (private parts) with your normal soap.  6. Wash thoroughly, paying special attention to the area where your surgery will be performed.  7. Thoroughly rinse your body with warm water from the neck down.  8. DO NOT shower/wash with your normal soap after using and rinsing off the CHG Soap.  9. Pat yourself dry with a CLEAN TOWEL.   10. Wear CLEAN PAJAMAS   11. Place CLEAN SHEETS on your bed the night of your first shower and DO NOT SLEEP WITH PETS.  Day of Surgery: Do not apply any deodorants/lotions. Please wear clean clothes to the hospital/surgery center.

## 2016-02-07 ENCOUNTER — Encounter (HOSPITAL_COMMUNITY): Payer: Self-pay

## 2016-02-07 ENCOUNTER — Encounter (HOSPITAL_COMMUNITY)
Admission: RE | Admit: 2016-02-07 | Discharge: 2016-02-07 | Disposition: A | Discharge status: Home / Self Care | Payer: 59 | Source: Ambulatory Visit | Attending: Surgery | Admitting: Surgery

## 2016-02-07 DIAGNOSIS — I4891 Unspecified atrial fibrillation: Secondary | ICD-10-CM | POA: Diagnosis not present

## 2016-02-07 DIAGNOSIS — I2510 Atherosclerotic heart disease of native coronary artery without angina pectoris: Secondary | ICD-10-CM | POA: Diagnosis not present

## 2016-02-07 DIAGNOSIS — I340 Nonrheumatic mitral (valve) insufficiency: Secondary | ICD-10-CM | POA: Diagnosis not present

## 2016-02-07 DIAGNOSIS — E7800 Pure hypercholesterolemia, unspecified: Secondary | ICD-10-CM | POA: Diagnosis not present

## 2016-02-07 DIAGNOSIS — I255 Ischemic cardiomyopathy: Secondary | ICD-10-CM | POA: Diagnosis not present

## 2016-02-07 DIAGNOSIS — K219 Gastro-esophageal reflux disease without esophagitis: Secondary | ICD-10-CM | POA: Diagnosis not present

## 2016-02-07 DIAGNOSIS — I252 Old myocardial infarction: Secondary | ICD-10-CM | POA: Diagnosis not present

## 2016-02-07 DIAGNOSIS — I6522 Occlusion and stenosis of left carotid artery: Secondary | ICD-10-CM | POA: Diagnosis not present

## 2016-02-07 DIAGNOSIS — I5022 Chronic systolic (congestive) heart failure: Secondary | ICD-10-CM | POA: Diagnosis not present

## 2016-02-07 HISTORY — DX: Nausea with vomiting, unspecified: R11.2

## 2016-02-07 HISTORY — DX: Other specified postprocedural states: Z98.890

## 2016-02-07 LAB — CBC
HEMATOCRIT: 42.3 % (ref 39.0–52.0)
Hemoglobin: 13.6 g/dL (ref 13.0–17.0)
MCH: 29.2 pg (ref 26.0–34.0)
MCHC: 32.2 g/dL (ref 30.0–36.0)
MCV: 91 fL (ref 78.0–100.0)
Platelets: 240 10*3/uL (ref 150–400)
RBC: 4.65 MIL/uL (ref 4.22–5.81)
RDW: 14.6 % (ref 11.5–15.5)
WBC: 9.7 10*3/uL (ref 4.0–10.5)

## 2016-02-07 LAB — SURGICAL PCR SCREEN
MRSA, PCR: NEGATIVE
Staphylococcus aureus: NEGATIVE

## 2016-02-07 LAB — APTT: APTT: 32 s (ref 24–36)

## 2016-02-07 LAB — URINALYSIS, ROUTINE W REFLEX MICROSCOPIC
BILIRUBIN URINE: NEGATIVE
Glucose, UA: NEGATIVE mg/dL
HGB URINE DIPSTICK: NEGATIVE
Ketones, ur: NEGATIVE mg/dL
Leukocytes, UA: NEGATIVE
Nitrite: NEGATIVE
PH: 6 (ref 5.0–8.0)
Protein, ur: NEGATIVE mg/dL
SPECIFIC GRAVITY, URINE: 1.009 (ref 1.005–1.030)

## 2016-02-07 LAB — PROTIME-INR
INR: 1.09
PROTHROMBIN TIME: 14.1 s (ref 11.4–15.2)

## 2016-02-07 LAB — COMPREHENSIVE METABOLIC PANEL
ALBUMIN: 4 g/dL (ref 3.5–5.0)
ALT: 29 U/L (ref 17–63)
ANION GAP: 6 (ref 5–15)
AST: 23 U/L (ref 15–41)
Alkaline Phosphatase: 119 U/L (ref 38–126)
BILIRUBIN TOTAL: 0.8 mg/dL (ref 0.3–1.2)
BUN: 25 mg/dL — ABNORMAL HIGH (ref 6–20)
CO2: 29 mmol/L (ref 22–32)
Calcium: 9.4 mg/dL (ref 8.9–10.3)
Chloride: 104 mmol/L (ref 101–111)
Creatinine, Ser: 1.09 mg/dL (ref 0.61–1.24)
GFR calc Af Amer: >60 mL/min (ref >60)
GLUCOSE: 90 mg/dL (ref 65–99)
POTASSIUM: 5 mmol/L (ref 3.5–5.1)
Sodium: 139 mmol/L (ref 135–145)
TOTAL PROTEIN: 7.3 g/dL (ref 6.5–8.1)

## 2016-02-07 LAB — TYPE AND SCREEN
ABO/RH(D): O POS
ANTIBODY SCREEN: NEGATIVE

## 2016-02-07 NOTE — Progress Notes (Signed)
PCP - Dr. Baltazar Apo Cardiologist - Dr. Bronson Ing  EKG - 02/07/16 CXR - 01/16/16  Echo- 12/14/15 Stress test - denies Cardiac Cath - 12/15/15  Patient denies chest pain and shortness of breath at PAT appointment.    Cardiac Clearance note 02/03/16.   Patient no longer taking Coumadin per MD's instructions.

## 2016-02-08 MED ORDER — DEXTROSE 5 % IV SOLN
1.5000 g | INTRAVENOUS | Status: AC
  Administered 2016-02-09: 1.5 g via INTRAVENOUS
  Filled 2016-02-08: qty 1.5

## 2016-02-08 NOTE — Progress Notes (Signed)
Anesthesia Chart Review: Patient is a 55 year old male scheduled for left CEA on 02/09/16 by Dr. Trula Slade. He is s/p CABG (LIMA-LAD, SVG-PDA, SVG-OM) and clipping of LA appendage on 12/17/15 by Dr. Roxy Manns. Other history includes afib/PAF, NSTEMI 12/2015, ischemic cardiomyopathy, chronic systolic CHF, mild-moderate MR 12/2015 echo, recent former smoker (quit 12/13/15), hypercholesterolemia, carotid occlusive disease, post-operative N/V. He was seen by Dr. Trula Slade prior to CABG due to findings ofasymptomatic severe LICA stenosis. Left CEA recommended following recovery from CABG.   - PCP is Dr. Grace Bushy. [place].  - Primary cardiologist was initially Dr. Rozann Lesches, but last visit was with Dr. Bronson Ing on 02/03/16 for follow-up and preop risk stratification prior to CEA. He wrote, "Can proceed with carotid endarterectomy without noninvasive testing. He is at least at an intermediate risk for major adverse cardiac events in the perioperative period given his severely reduced left ventricular systolic function. However, he has excellent exercise tolerance portending a more favorable outcome with respect to surgery." Warfarin was discontinued as patient was maintaining SR. He will have an echo prior to his cardiology follow-up in November. - CT surgeon is Dr. Roxy Manns, last visit 02/01/16. He agrees with plans for elective left CEA following cardiology evaluation (which was done).  Meds include ASA, Lipitor, Coreg, Lasix, lisinopril, KCl.  02/07/16 EKG: NSR, possible septal infarct (age undetermined), inferior infarct (age undetermined), T wave abnormality, consider lateral ischemia. No significant change since last tracing.  12/17/15 Carotid U/S: Summary: Findings suggest low range 40-59% right internal carotid artery stenosis by velocity and 80-99% left internal carotid artery stenosis by velocity. Vertebral arteries are patent with antegrade flow.       12/15/15 PRE-CABG RHC/LHC: LM and 3V CAD, EF 30-35%, mild  pulmonary hypertension, moderately elevated filling pressures and normal cardiac output.  12/14/15 Echo: Impressions: - Mild LVH with LVEF 20-25%. There is diffuse hypokinesis with   basal inferior akinesis and severe hypokinesis of the mid to   apical anteroseptal walls. Acoustic shadowing noted at apex,   Definity contrast shows slow swirling flow in the LV apex   insistent with low output but no definitive mural thrombus.   Restrictive diastolic filling pattern with increased LV filling   pressures. Severe left atrial enlargement. Mild to moderate   mitral regurgitation. Mildly calcified aortic annulus. Moderately   reduced RV contraction. Trivial tricuspid regurgitation with PASP   estimated 51 mmHg and elevated CVP. (EF 30%, mild MR by intra-op TEE on 12/17/15.)  01/14/16 CXR: IMPRESSION: Post CABG and LEFT atrial appendage clipping. Improved bibasilar aeration.  Preoperative labs noted.   If no acute CV/CHF symptoms then I would anticipate that he could proceed as planned.  George Hugh [facility] Short Stay Center/Anesthesiology Phone [phone] 02/08/2016 10:54 AM

## 2016-02-09 ENCOUNTER — Encounter (HOSPITAL_COMMUNITY)
Admission: RE | Disposition: A | Discharge status: Home / Self Care | Payer: Self-pay | Source: Ambulatory Visit | Attending: Surgery

## 2016-02-09 ENCOUNTER — Encounter (HOSPITAL_COMMUNITY): Payer: Self-pay | Admitting: General Practice

## 2016-02-09 ENCOUNTER — Inpatient Hospital Stay (HOSPITAL_COMMUNITY): Payer: 59 | Admitting: Vascular Surgery

## 2016-02-09 ENCOUNTER — Inpatient Hospital Stay (HOSPITAL_COMMUNITY)
Admission: RE | Admit: 2016-02-09 | Discharge: 2016-02-10 | DRG: 038 | Disposition: A | Discharge status: Home / Self Care | Payer: 59 | Source: Ambulatory Visit | Attending: Surgery | Admitting: Surgery

## 2016-02-09 ENCOUNTER — Inpatient Hospital Stay (HOSPITAL_COMMUNITY): Payer: 59 | Admitting: Anesthesiology

## 2016-02-09 DIAGNOSIS — Z7982 Long term (current) use of aspirin: Secondary | ICD-10-CM | POA: Diagnosis not present

## 2016-02-09 DIAGNOSIS — Z7901 Long term (current) use of anticoagulants: Secondary | ICD-10-CM | POA: Diagnosis not present

## 2016-02-09 DIAGNOSIS — I4891 Unspecified atrial fibrillation: Secondary | ICD-10-CM | POA: Diagnosis present

## 2016-02-09 DIAGNOSIS — I252 Old myocardial infarction: Secondary | ICD-10-CM

## 2016-02-09 DIAGNOSIS — I255 Ischemic cardiomyopathy: Secondary | ICD-10-CM | POA: Diagnosis present

## 2016-02-09 DIAGNOSIS — E7800 Pure hypercholesterolemia, unspecified: Secondary | ICD-10-CM | POA: Diagnosis present

## 2016-02-09 DIAGNOSIS — I6522 Occlusion and stenosis of left carotid artery: Principal | ICD-10-CM | POA: Diagnosis present

## 2016-02-09 DIAGNOSIS — Z79899 Other long term (current) drug therapy: Secondary | ICD-10-CM | POA: Diagnosis not present

## 2016-02-09 DIAGNOSIS — Z951 Presence of aortocoronary bypass graft: Secondary | ICD-10-CM

## 2016-02-09 DIAGNOSIS — K219 Gastro-esophageal reflux disease without esophagitis: Secondary | ICD-10-CM | POA: Diagnosis present

## 2016-02-09 DIAGNOSIS — Z8249 Family history of ischemic heart disease and other diseases of the circulatory system: Secondary | ICD-10-CM

## 2016-02-09 DIAGNOSIS — I2510 Atherosclerotic heart disease of native coronary artery without angina pectoris: Secondary | ICD-10-CM | POA: Diagnosis present

## 2016-02-09 DIAGNOSIS — I6529 Occlusion and stenosis of unspecified carotid artery: Secondary | ICD-10-CM | POA: Diagnosis present

## 2016-02-09 DIAGNOSIS — R599 Enlarged lymph nodes, unspecified: Secondary | ICD-10-CM | POA: Diagnosis not present

## 2016-02-09 DIAGNOSIS — I5022 Chronic systolic (congestive) heart failure: Secondary | ICD-10-CM | POA: Diagnosis present

## 2016-02-09 DIAGNOSIS — I340 Nonrheumatic mitral (valve) insufficiency: Secondary | ICD-10-CM | POA: Diagnosis present

## 2016-02-09 DIAGNOSIS — Z720 Tobacco use: Secondary | ICD-10-CM

## 2016-02-09 HISTORY — PX: PATCH ANGIOPLASTY: SHX6230

## 2016-02-09 HISTORY — PX: ENDARTERECTOMY: SHX5162

## 2016-02-09 LAB — CREATININE, SERUM: CREATININE: 1.04 mg/dL (ref 0.61–1.24)

## 2016-02-09 LAB — CBC
HEMATOCRIT: 40.5 % (ref 39.0–52.0)
Hemoglobin: 13.1 g/dL (ref 13.0–17.0)
MCH: 29.2 pg (ref 26.0–34.0)
MCHC: 32.3 g/dL (ref 30.0–36.0)
MCV: 90.4 fL (ref 78.0–100.0)
PLATELETS: 231 10*3/uL (ref 150–400)
RBC: 4.48 MIL/uL (ref 4.22–5.81)
RDW: 14.9 % (ref 11.5–15.5)
WBC: 8.3 10*3/uL (ref 4.0–10.5)

## 2016-02-09 SURGERY — ENDARTERECTOMY, CAROTID
Anesthesia: General | Site: Neck | Laterality: Left

## 2016-02-09 MED ORDER — ACETAMINOPHEN 325 MG PO TABS: 325.0000 mg | ORAL_TABLET | ORAL | Status: DC | PRN

## 2016-02-09 MED ORDER — POTASSIUM CHLORIDE CRYS ER 20 MEQ PO TBCR: 20.0000 meq | EXTENDED_RELEASE_TABLET | Freq: Every day | ORAL | Status: DC | PRN

## 2016-02-09 MED ORDER — HYDRALAZINE HCL 20 MG/ML IJ SOLN: 5.0000 mg | INTRAMUSCULAR | Status: DC | PRN

## 2016-02-09 MED ORDER — HEPARIN SODIUM (PORCINE) 1000 UNIT/ML IJ SOLN
INTRAMUSCULAR | Status: AC
  Filled 2016-02-09: qty 1

## 2016-02-09 MED ORDER — ATORVASTATIN CALCIUM 80 MG PO TABS
80.0000 mg | ORAL_TABLET | Freq: Every day | ORAL | Status: DC
  Administered 2016-02-09: 80 mg via ORAL
  Filled 2016-02-09: qty 1

## 2016-02-09 MED ORDER — ASPIRIN EC 81 MG PO TBEC: 81.0000 mg | DELAYED_RELEASE_TABLET | Freq: Every day | ORAL | Status: DC

## 2016-02-09 MED ORDER — 0.9 % SODIUM CHLORIDE (POUR BTL) OPTIME
TOPICAL | Status: DC | PRN
  Administered 2016-02-09: 2000 mL

## 2016-02-09 MED ORDER — FENTANYL CITRATE (PF) 100 MCG/2ML IJ SOLN: 25.0000 ug | INTRAMUSCULAR | Status: DC | PRN

## 2016-02-09 MED ORDER — OXYCODONE-ACETAMINOPHEN 5-325 MG PO TABS: 1.0000 | ORAL_TABLET | ORAL | Status: DC | PRN

## 2016-02-09 MED ORDER — LISINOPRIL 5 MG PO TABS
2.5000 mg | ORAL_TABLET | Freq: Every day | ORAL | Status: DC
  Administered 2016-02-09: 2.5 mg via ORAL
  Filled 2016-02-09: qty 1

## 2016-02-09 MED ORDER — ONDANSETRON HCL 4 MG/2ML IJ SOLN: 4.0000 mg | Freq: Four times a day (QID) | INTRAMUSCULAR | Status: DC | PRN

## 2016-02-09 MED ORDER — ALUM & MAG HYDROXIDE-SIMETH 200-200-20 MG/5ML PO SUSP: 15.0000 mL | ORAL | Status: DC | PRN

## 2016-02-09 MED ORDER — HEPARIN SODIUM (PORCINE) 1000 UNIT/ML IJ SOLN
INTRAMUSCULAR | Status: DC | PRN
  Administered 2016-02-09: 10000 [IU] via INTRAVENOUS
  Administered 2016-02-09: 1000 [IU] via INTRAVENOUS

## 2016-02-09 MED ORDER — MIDAZOLAM HCL 2 MG/2ML IJ SOLN
INTRAMUSCULAR | Status: AC
  Filled 2016-02-09: qty 2

## 2016-02-09 MED ORDER — PROMETHAZINE HCL 25 MG/ML IJ SOLN: 6.2500 mg | INTRAMUSCULAR | Status: DC | PRN

## 2016-02-09 MED ORDER — CHLORHEXIDINE GLUCONATE CLOTH 2 % EX PADS: 6.0000 | MEDICATED_PAD | Freq: Once | CUTANEOUS | Status: DC

## 2016-02-09 MED ORDER — LACTATED RINGERS IV SOLN
INTRAVENOUS | Status: DC | PRN
  Administered 2016-02-09 (×2): via INTRAVENOUS

## 2016-02-09 MED ORDER — METOPROLOL TARTRATE 5 MG/5ML IV SOLN: 2.0000 mg | INTRAVENOUS | Status: DC | PRN

## 2016-02-09 MED ORDER — SUCCINYLCHOLINE CHLORIDE 20 MG/ML IJ SOLN
INTRAMUSCULAR | Status: DC | PRN
  Administered 2016-02-09: 100 mg via INTRAVENOUS

## 2016-02-09 MED ORDER — HEMOSTATIC AGENTS (NO CHARGE) OPTIME
TOPICAL | Status: DC | PRN
  Administered 2016-02-09: 1 via TOPICAL

## 2016-02-09 MED ORDER — VASOPRESSIN 20 UNIT/ML IV SOLN
INTRAVENOUS | Status: AC
  Filled 2016-02-09: qty 1

## 2016-02-09 MED ORDER — PROPOFOL 10 MG/ML IV BOLUS
INTRAVENOUS | Status: AC
  Filled 2016-02-09: qty 20

## 2016-02-09 MED ORDER — ONDANSETRON HCL 4 MG/2ML IJ SOLN
INTRAMUSCULAR | Status: AC
  Filled 2016-02-09: qty 2

## 2016-02-09 MED ORDER — MIDAZOLAM HCL 5 MG/5ML IJ SOLN
INTRAMUSCULAR | Status: DC | PRN
Start: 2016-02-09 — End: 2016-02-09
  Administered 2016-02-09 (×2): 1 mg via INTRAVENOUS

## 2016-02-09 MED ORDER — EPHEDRINE 5 MG/ML INJ
INTRAVENOUS | Status: AC
  Filled 2016-02-09: qty 10

## 2016-02-09 MED ORDER — ROCURONIUM BROMIDE 100 MG/10ML IV SOLN
INTRAVENOUS | Status: DC | PRN
  Administered 2016-02-09: 50 mg via INTRAVENOUS

## 2016-02-09 MED ORDER — LIDOCAINE 2% (20 MG/ML) 5 ML SYRINGE
INTRAMUSCULAR | Status: AC
  Filled 2016-02-09: qty 5

## 2016-02-09 MED ORDER — FENTANYL CITRATE (PF) 100 MCG/2ML IJ SOLN
INTRAMUSCULAR | Status: DC | PRN
  Administered 2016-02-09 (×5): 50 ug via INTRAVENOUS

## 2016-02-09 MED ORDER — LIDOCAINE HCL (PF) 1 % IJ SOLN
INTRAMUSCULAR | Status: AC
  Filled 2016-02-09: qty 30

## 2016-02-09 MED ORDER — EPINEPHRINE HCL 1 MG/ML IJ SOLN
INTRAMUSCULAR | Status: DC | PRN
  Administered 2016-02-09 (×4): .01 mg via INTRAVENOUS

## 2016-02-09 MED ORDER — PROTAMINE SULFATE 10 MG/ML IV SOLN
INTRAVENOUS | Status: DC | PRN
  Administered 2016-02-09: 50 mg via INTRAVENOUS

## 2016-02-09 MED ORDER — PHENOL 1.4 % MT LIQD
1.0000 | OROMUCOSAL | Status: DC | PRN
Start: 2016-02-09 — End: 2016-02-10

## 2016-02-09 MED ORDER — PROTAMINE SULFATE 10 MG/ML IV SOLN
INTRAVENOUS | Status: AC
  Filled 2016-02-09: qty 5

## 2016-02-09 MED ORDER — DEXAMETHASONE SODIUM PHOSPHATE 10 MG/ML IJ SOLN
INTRAMUSCULAR | Status: AC
  Filled 2016-02-09: qty 1

## 2016-02-09 MED ORDER — SODIUM CHLORIDE 0.9 % IV SOLN: 500.0000 mL | Freq: Once | INTRAVENOUS | Status: DC | PRN

## 2016-02-09 MED ORDER — SUGAMMADEX SODIUM 200 MG/2ML IV SOLN
INTRAVENOUS | Status: AC
  Filled 2016-02-09: qty 2

## 2016-02-09 MED ORDER — PHENYLEPHRINE HCL 10 MG/ML IJ SOLN
INTRAVENOUS | Status: DC | PRN
  Administered 2016-02-09: 25 ug/min via INTRAVENOUS

## 2016-02-09 MED ORDER — LABETALOL HCL 5 MG/ML IV SOLN: 10.0000 mg | INTRAVENOUS | Status: DC | PRN

## 2016-02-09 MED ORDER — SODIUM CHLORIDE 0.9 % IV SOLN: INTRAVENOUS | Status: DC

## 2016-02-09 MED ORDER — ARTIFICIAL TEARS OP OINT
TOPICAL_OINTMENT | OPHTHALMIC | Status: DC | PRN
  Administered 2016-02-09: 1 via OPHTHALMIC

## 2016-02-09 MED ORDER — ACETAMINOPHEN 325 MG RE SUPP: 325.0000 mg | RECTAL | Status: DC | PRN

## 2016-02-09 MED ORDER — LIDOCAINE HCL (CARDIAC) 20 MG/ML IV SOLN
INTRAVENOUS | Status: DC | PRN
  Administered 2016-02-09: 80 mg via INTRAVENOUS

## 2016-02-09 MED ORDER — MAGNESIUM SULFATE 2 GM/50ML IV SOLN: 2.0000 g | Freq: Every day | INTRAVENOUS | Status: DC | PRN

## 2016-02-09 MED ORDER — CARVEDILOL 6.25 MG PO TABS
6.2500 mg | ORAL_TABLET | Freq: Two times a day (BID) | ORAL | Status: DC
  Administered 2016-02-09: 6.25 mg via ORAL
  Filled 2016-02-09: qty 1

## 2016-02-09 MED ORDER — MORPHINE SULFATE (PF) 2 MG/ML IV SOLN: 2.0000 mg | INTRAVENOUS | Status: DC | PRN

## 2016-02-09 MED ORDER — GLYCOPYRROLATE 0.2 MG/ML IV SOSY
PREFILLED_SYRINGE | INTRAVENOUS | Status: AC
  Filled 2016-02-09: qty 3

## 2016-02-09 MED ORDER — DEXTROSE 5 % IV SOLN
1.5000 g | Freq: Two times a day (BID) | INTRAVENOUS | Status: DC
  Administered 2016-02-09: 1.5 g via INTRAVENOUS
  Filled 2016-02-09 (×2): qty 1.5

## 2016-02-09 MED ORDER — SUGAMMADEX SODIUM 200 MG/2ML IV SOLN
INTRAVENOUS | Status: DC | PRN
  Administered 2016-02-09: 100 mg via INTRAVENOUS

## 2016-02-09 MED ORDER — GLYCOPYRROLATE 0.2 MG/ML IJ SOLN
INTRAMUSCULAR | Status: DC | PRN
  Administered 2016-02-09: 0.2 mg via INTRAVENOUS

## 2016-02-09 MED ORDER — EPHEDRINE SULFATE 50 MG/ML IJ SOLN
INTRAMUSCULAR | Status: DC | PRN
  Administered 2016-02-09 (×2): 5 mg via INTRAVENOUS
  Administered 2016-02-09: 15 mg via INTRAVENOUS

## 2016-02-09 MED ORDER — ARTIFICIAL TEARS OP OINT
TOPICAL_OINTMENT | OPHTHALMIC | Status: AC
  Filled 2016-02-09: qty 7

## 2016-02-09 MED ORDER — SUCCINYLCHOLINE CHLORIDE 200 MG/10ML IV SOSY
PREFILLED_SYRINGE | INTRAVENOUS | Status: AC
  Filled 2016-02-09: qty 10

## 2016-02-09 MED ORDER — GUAIFENESIN-DM 100-10 MG/5ML PO SYRP: 15.0000 mL | ORAL_SOLUTION | ORAL | Status: DC | PRN

## 2016-02-09 MED ORDER — PANTOPRAZOLE SODIUM 40 MG PO TBEC
40.0000 mg | DELAYED_RELEASE_TABLET | Freq: Every day | ORAL | Status: DC
  Administered 2016-02-09: 40 mg via ORAL
  Filled 2016-02-09: qty 1

## 2016-02-09 MED ORDER — DEXAMETHASONE SODIUM PHOSPHATE 10 MG/ML IJ SOLN
INTRAMUSCULAR | Status: DC | PRN
  Administered 2016-02-09: 10 mg via INTRAVENOUS

## 2016-02-09 MED ORDER — ROCURONIUM BROMIDE 10 MG/ML (PF) SYRINGE
PREFILLED_SYRINGE | INTRAVENOUS | Status: AC
  Filled 2016-02-09: qty 10

## 2016-02-09 MED ORDER — VASOPRESSIN 20 UNIT/ML IV SOLN
INTRAVENOUS | Status: DC | PRN
  Administered 2016-02-09: 2 [IU] via INTRAVENOUS
  Administered 2016-02-09 (×2): 1 [IU] via INTRAVENOUS

## 2016-02-09 MED ORDER — PROPOFOL 10 MG/ML IV BOLUS
INTRAVENOUS | Status: DC | PRN
  Administered 2016-02-09: 140 mg via INTRAVENOUS

## 2016-02-09 MED ORDER — HEPARIN SODIUM (PORCINE) 5000 UNIT/ML IJ SOLN
INTRAMUSCULAR | Status: DC | PRN
  Administered 2016-02-09: 500 mL

## 2016-02-09 MED ORDER — DOCUSATE SODIUM 100 MG PO CAPS: 100.0000 mg | ORAL_CAPSULE | Freq: Every day | ORAL | Status: DC

## 2016-02-09 MED ORDER — ONDANSETRON HCL 4 MG/2ML IJ SOLN
INTRAMUSCULAR | Status: DC | PRN
  Administered 2016-02-09: 4 mg via INTRAVENOUS

## 2016-02-09 MED ORDER — PHENYLEPHRINE 40 MCG/ML (10ML) SYRINGE FOR IV PUSH (FOR BLOOD PRESSURE SUPPORT)
PREFILLED_SYRINGE | INTRAVENOUS | Status: AC
  Filled 2016-02-09: qty 10

## 2016-02-09 MED ORDER — FENTANYL CITRATE (PF) 250 MCG/5ML IJ SOLN
INTRAMUSCULAR | Status: AC
  Filled 2016-02-09: qty 5

## 2016-02-09 MED ORDER — ENOXAPARIN SODIUM 40 MG/0.4ML ~~LOC~~ SOLN: 40.0000 mg | SUBCUTANEOUS | Status: DC

## 2016-02-09 MED ORDER — SODIUM CHLORIDE 0.9 % IV SOLN
INTRAVENOUS | Status: DC
  Administered 2016-02-09: 18:00:00 via INTRAVENOUS

## 2016-02-09 SURGICAL SUPPLY — 49 items
CANISTER SUCTION 2500CC (MISCELLANEOUS) ×2 IMPLANT
CATH ROBINSON RED A/P 18FR (CATHETERS) ×2 IMPLANT
CATH SUCT 10FR WHISTLE TIP (CATHETERS) ×2 IMPLANT
CLIP TI MEDIUM 6 (CLIP) ×2 IMPLANT
CLIP TI WIDE RED SMALL 6 (CLIP) ×2 IMPLANT
CONT SPEC 4OZ CLIKSEAL STRL BL (MISCELLANEOUS) ×2 IMPLANT
CRADLE DONUT ADULT HEAD (MISCELLANEOUS) ×2 IMPLANT
DRAIN CHANNEL 15F RND FF W/TCR (WOUND CARE) IMPLANT
ELECT REM PT RETURN 9FT ADLT (ELECTROSURGICAL) ×2
ELECTRODE REM PT RTRN 9FT ADLT (ELECTROSURGICAL) ×1 IMPLANT
EVACUATOR SILICONE 100CC (DRAIN) IMPLANT
GAUZE SPONGE 4X4 12PLY STRL (GAUZE/BANDAGES/DRESSINGS) ×2 IMPLANT
GLOVE BIO SURGEON STRL SZ 6.5 (GLOVE) ×2 IMPLANT
GLOVE BIOGEL PI IND STRL 6.5 (GLOVE) ×4 IMPLANT
GLOVE BIOGEL PI IND STRL 7.5 (GLOVE) ×1 IMPLANT
GLOVE BIOGEL PI INDICATOR 6.5 (GLOVE) ×4
GLOVE BIOGEL PI INDICATOR 7.5 (GLOVE) ×1
GLOVE ECLIPSE 6.5 STRL STRAW (GLOVE) ×2 IMPLANT
GLOVE SURG SS PI 7.5 STRL IVOR (GLOVE) ×4 IMPLANT
GOWN STRL REUS W/ TWL LRG LVL3 (GOWN DISPOSABLE) ×2 IMPLANT
GOWN STRL REUS W/ TWL XL LVL3 (GOWN DISPOSABLE) ×1 IMPLANT
GOWN STRL REUS W/TWL LRG LVL3 (GOWN DISPOSABLE) ×2
GOWN STRL REUS W/TWL XL LVL3 (GOWN DISPOSABLE) ×1
HEMOSTAT SNOW SURGICEL 2X4 (HEMOSTASIS) ×2 IMPLANT
INSERT FOGARTY SM (MISCELLANEOUS) IMPLANT
KIT BASIN OR (CUSTOM PROCEDURE TRAY) ×2 IMPLANT
KIT ROOM TURNOVER OR (KITS) ×2 IMPLANT
LIQUID BAND (GAUZE/BANDAGES/DRESSINGS) ×2 IMPLANT
NEEDLE HYPO 25GX1X1/2 BEV (NEEDLE) ×2 IMPLANT
NS IRRIG 1000ML POUR BTL (IV SOLUTION) ×6 IMPLANT
PACK CAROTID (CUSTOM PROCEDURE TRAY) ×2 IMPLANT
PAD ARMBOARD 7.5X6 YLW CONV (MISCELLANEOUS) ×4 IMPLANT
PATCH VASC XENOSURE 1CMX6CM (Vascular Products) ×1 IMPLANT
PATCH VASC XENOSURE 1X6 (Vascular Products) ×1 IMPLANT
SHUNT CAROTID BYPASS 10 (VASCULAR PRODUCTS) IMPLANT
SHUNT CAROTID BYPASS 12FRX15.5 (VASCULAR PRODUCTS) IMPLANT
SPONGE INTESTINAL PEANUT (DISPOSABLE) ×2 IMPLANT
SUT ETHILON 3 0 PS 1 (SUTURE) IMPLANT
SUT PROLENE 5 0 C 1 24 (SUTURE) ×6 IMPLANT
SUT PROLENE 6 0 BV (SUTURE) ×6 IMPLANT
SUT PROLENE 7 0 BV 1 (SUTURE) IMPLANT
SUT PROLENE 7 0 BV1 MDA (SUTURE) ×2 IMPLANT
SUT SILK 3 0 TIES 17X18 (SUTURE)
SUT SILK 3-0 18XBRD TIE BLK (SUTURE) IMPLANT
SUT VIC AB 3-0 SH 27 (SUTURE) ×3
SUT VIC AB 3-0 SH 27X BRD (SUTURE) ×3 IMPLANT
SUT VICRYL 4-0 PS2 18IN ABS (SUTURE) ×2 IMPLANT
SYR CONTROL 10ML LL (SYRINGE) ×2 IMPLANT
WATER STERILE IRR 1000ML POUR (IV SOLUTION) ×2 IMPLANT

## 2016-02-09 NOTE — H&P (View-Only) (Signed)
[facility]  Patient name: [name] [name] MRN: [id number] DOB: [birth year] Sex: male  REASON FOR VISIT: follow up  HPI: This is a 55 year old maleWho presented to the hospital on 12/13/2015 with increased heart rate and abnormal EKG.  He states that he has been having right flank pain for several weeks which has become progressively more towards the epigastrium.  He also began complaining of shortness of breath, leg swelling, orthopnea and intermittent chest pain.  His troponins were elevated.  He underwent cardiac catheterization which revealed severe multivessel disease.  His ejection fraction was approximately 20-25 percent.  He did have significant fluid weight gain which has improved with diuresis.  His symptoms have improved.  He has been scheduled for coronary bypass.  During his pre-CABG workup he was found to have a high-grade left carotid stenosis on ultrasound.  The patient is asymptomatic.  Specifically, he denies numbness or weakness in either extremity.  He denies slurred speech.  He denies amaurosis fugax.  The patient suffers from long-standing tobacco abuse.  He is now on a statin for hypercholesterolemia. He did go into A. fib with rapid ventricular response which converted with diltiazem back into sinus rhythm.  He underwent CABG approximately one month ago.  He has done very well and is recovering nicely.  He is on Coumadin for atrial fibrillation.  He denies neurologic symptoms since his CABG.  Past Medical History:  Diagnosis Date  . Acute systolic congestive heart failure ([place])   . Atrial fibrillation with rapid ventricular response ([place]) 12/13/2015  . Cardiomyopathy ([place]) 12/13/2015  . Cardiomyopathy, ischemic 12/14/2015   EF 20-25%  . Coronary artery disease involving native coronary artery with unstable angina pectoris ([place]) 12/15/2015  . GERD (gastroesophageal reflux disease)   . Hypercholesterolemia  12/15/2015  . Mitral regurgitation 12/14/2015   moderate  . NSTEMI (non-ST elevated myocardial infarction) ([place])   . 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 Tobacco abuse     Family History  Problem Relation Age of Onset  . Heart attack Paternal Grandfather   . Heart failure Father   . Atrial fibrillation Father     SOCIAL HISTORY: Social History  Substance Use Topics  . Smoking status: Former Smoker    Packs/day: 1.00    Types: Cigarettes  . Smokeless tobacco: Former Systems developer    Quit date: 12/13/2015  . Alcohol use No    No Known Allergies  Current Outpatient Prescriptions  Medication Sig Dispense Refill  . aspirin 81 MG EC tablet Take 1 tablet (81 mg total) by mouth daily. 30 tablet   . atorvastatin (LIPITOR) 80 MG tablet Take 1 tablet (80 mg total) by mouth daily at 6 PM. 30 tablet 3  . carvedilol (COREG) 6.25 MG tablet Take 1 tablet (6.25 mg total) by mouth 2 (two) times daily with a meal. 60 tablet 3  . furosemide (LASIX) 40 MG tablet Take 1 tablet (40 mg total) by mouth as needed. 90 tablet 3  . lisinopril (PRINIVIL,ZESTRIL) 2.5 MG tablet Take 1 tablet (2.5 mg total) by mouth daily. 30 tablet 3  . potassium chloride SA (K-DUR,KLOR-CON) 20 MEQ tablet Take 1 tablet (20 mEq total) by mouth daily. (Patient taking differently: Take 20 mEq by mouth daily. As needed) 90 tablet 0  . warfarin (COUMADIN) 5 MG tablet Take  1 tablet (5 mg total) by mouth daily at 6 PM. 30 tablet 3   No current facility-administered medications for this visit.     REVIEW OF SYSTEMS:  [X]  denotes positive finding, [ ]  denotes negative finding Cardiac  Comments:  Chest pain or chest pressure:    Shortness of breath upon exertion:    Short of breath when lying flat:    Irregular heart rhythm:        Vascular    Pain in calf, thigh, or hip brought on by ambulation:    Pain in feet at night that  wakes you up from your sleep:     Blood clot in your veins:    Leg swelling:         Pulmonary    Oxygen at home:    Productive cough:     Wheezing:         Neurologic    Sudden weakness in arms or legs:     Sudden numbness in arms or legs:     Sudden onset of difficulty speaking or slurred speech:    Temporary loss of vision in one eye:     Problems with dizziness:         Gastrointestinal    Blood in stool:     Vomited blood:         Genitourinary    Burning when urinating:     Blood in urine:        Psychiatric    Major depression:         Hematologic    Bleeding problems:    Problems with blood clotting too easily:        Skin    Rashes or ulcers:        Constitutional    Fever or chills:      PHYSICAL EXAM: Vitals:   01/24/16 0911 01/24/16 0922  BP: 130/82 138/84  Pulse: 62   Resp: 16   Temp: 97.2 F (36.2 C)   TempSrc: Oral   SpO2: 99%   Weight: 220 lb (99.8 kg)     GENERAL: The patient is a well-nourished male, in no acute distress. The vital signs are documented above. CARDIAC: There is a regular rate and rhythm.  VASCULAR: Bilateral carotid bruit PULMONARY: There is good air exchange bilaterally without wheezing or rales. ABDOMEN: Soft and non-tender with normal pitched bowel sounds.  No pulsatile mass MUSCULOSKELETAL: There are no major deformities or cyanosis. NEUROLOGIC: No focal weakness or paresthesias are detected. SKIN: There are no ulcers or rashes noted. PSYCHIATRIC: The patient has a normal affect.  DATA:  Carotid ultrasound was reviewed again.  This shows greater than 80% left carotid stenosis  MEDICAL ISSUES: Asymptomatic left carotid stenosis: We discussed proceeding with left carotid endarterectomy on Wednesday, August 9.  The risks and benefits of the operation were discussed with the patient which include but are not limited to stroke, bleeding, nerve injury, and facial numbness.  All of his questions were answered.  I will get  formal clearance to proceed with surgery from Dr. Roxy Manns.  In addition, he will need to be off of his Coumadin prior to surgery.    Annamarie Major, MD [facility] [phone] Pager [phone]

## 2016-02-09 NOTE — Progress Notes (Signed)
  [facility] Day of Surgery Note  Subjective: No complains. Wants to get out of bed.   Vitals:   02/09/16 1620 02/09/16 1700  BP:    Pulse:  72  Resp:  13  Temp: 97.9 F (36.6 C)     Left neck incision c/d/i. No hematoma. 5/5 strength upper and lower extremities bilaterally. Tongue midline. No facial droop.    Assessment/Plan:  This is a 55 y.o. male who is s/p left carotid endarterectomy  Stable post-op.  Neuro exam intact.    Virgina Jock, [place] Pager: [phone] 02/09/2016 5:45 PM

## 2016-02-09 NOTE — Anesthesia Postprocedure Evaluation (Signed)
Anesthesia Post Note  Patient: [name] [name]  Procedure(s) Performed: Procedure(s) (LRB): LEFT CAROTID ENDARTERECTOMY (Left) PATCH ANGIOPLASTY USING XENOSURE BIOLOGIC PATCH (Left)  Patient location during evaluation: PACU Anesthesia Type: General Level of consciousness: awake and alert Pain management: pain level controlled Vital Signs Assessment: post-procedure vital signs reviewed and stable Respiratory status: spontaneous breathing, nonlabored ventilation, respiratory function stable and patient connected to nasal cannula oxygen Cardiovascular status: blood pressure returned to baseline and stable Postop Assessment: no signs of nausea or vomiting Anesthetic complications: no    Last Vitals:  Vitals:   02/09/16 0647 02/09/16 1147  BP: 123/66   Pulse: 65   Resp: 20   Temp: 36.4 C 36.4 C    Last Pain:  Vitals:   02/09/16 1147  TempSrc:   PainSc: 0-No pain                 Suzzane Quilter S

## 2016-02-09 NOTE — Op Note (Signed)
Patient name: [name] [name] MRN: [id number] DOB: 01-15-1961 Sex: male  02/09/2016 Pre-operative Diagnosis: Asymptomatic   left carotid stenosis Post-operative diagnosis:  Same Surgeon:  Annamarie Major Assistants:  Silva Bandy Procedure:    left carotid Endarterectomy with bovine pericardial patch angioplasty Anesthesia:  General Blood Loss:  See anesthesia record Specimens:  Carotid Plaque to pathology  Findings:  80 %stenosis; Thrombus:  none  Indications:  55 year old found to have >80% left carotid stenosis during workup for CAD,  He is recently s/p CABG and is here for CEA.  Procedure:  The patient was identified in the holding area and taken to [place] 16  The patient was then placed supine on the table.   General endotrachial anesthesia was administered.  The patient was prepped and draped in the usual sterile fashion.  A time out was called and antibiotics were administered.  The incision was made along the anterior border of the left sternocleidomastoid muscle.  Cautery was used to dissect through the subcutaneous tissue.  The platysma muscle was divided with cautery.  The internal jugular vein was exposed along its anterior medial border.  The common facial vein was exposed and then divided between 2-0 silk ties and metal clips.  The common carotid artery was then circumferentially exposed and encircled with an umbilical tape.  The vagus nerve was identified and protected.  Next sharp dissection was used to expose the external carotid artery and the superior thyroid artery.  The were encircled with a blue vessel loop and a 2-0 silk tie respectively.  Finally, the internal carotid was carefully dissected free.  An umbilical tape was placed around the internal carotid artery distal to the diseased segment.  The hypoglossal nerve was visualized throughout and protected.  The patient was given systemic heparinization.  A bovine carotid patch was selected and prepared on the back table.  A 10  french shunt was also prepared.  After blood pressure readings were appropriate and the heparin had been given time to circulate, the internal carotid artery was occluded with a baby Gregory clamp.  The external and common carotid arteries were then occluded with vascular clamps and the 2-0 tie tightened on the superior thyroid artery.  A #11 blade was used to make an arteriotomy in the common carotid artery.  This was extended with Potts scissors along the anterior and lateral border of the common and internal carotid artery.  Approximately 80% stenosis was identified.  There was no thrombus identified.  The 10 french shunt was not placed, as there was excellent backbleeding, and the distance of the distal endpoint.  A kleiner kuntz elevator was used to perform endarterectomy.  An eversion endarterectomy was performed in the external carotid artery.  A good distal endpoint was obtained in the internal carotid artery.  The specimen was removed and sent to pathology.  Heparinized saline was used to irrigate the endarterectomized field.  All potential embolic debris was removed.  Bovine pericardial patch angioplasty was then performed using a running 6-0 Prolene.  The common internal and external carotid arteries were all appropriately flushed. The artery was again irrigated with heparin saline.  The anastomosis was then secured. The clamp was first released on the external carotid artery followed by the common carotid artery approximately 30 seconds later, bloodflow was reestablish through the internal carotid artery.  Next, a hand-held  Doppler was used to evaluate the signals in the common, external, and internal  carotid arteries, all of  which had appropriate signals. I then administered  50 mg protamine. The wound was then irrigated.  After hemostasis was achieved, the carotid sheath was reapproximated with 3-0 Vicryl. The  platysma muscle was reapproximated with running 3-0 Vicryl. The skin  was closed with  4-0 Vicryl. Dermabond was placed on the skin. The  patient was then successfully extubated. His neurologic exam was  similar to his preprocedural exam. The patient was then taken to recovery room  in stable condition. There were no complications.     Disposition:  To PACU in stable condition.  Relevant Operative Details:  Normal bifurcation.  Lesion extended 3cm in to the ICA.  Full mobilization of the hypoglossal was required. No shunt was placed as there was excellent back-bleeding and because of how high the lesion was located.  Patient's BP was very difficult to get above 160 before carotid occlusion.  Theotis Burrow, M.D. [facility] [place] Office: [phone] Pager:  [phone]

## 2016-02-09 NOTE — Anesthesia Preprocedure Evaluation (Addendum)
Anesthesia Evaluation  Patient identified by MRN, date of birth, ID band Patient awake    Reviewed: Allergy & Precautions, NPO status , Patient's Chart, lab work & pertinent test results  Airway Mallampati: II  TM Distance: >3 FB Neck ROM: Full    Dental no notable dental hx.    Pulmonary neg pulmonary ROS, former smoker,    Pulmonary exam normal breath sounds clear to auscultation       Cardiovascular + CAD, + Past MI and +CHF  Normal cardiovascular exam Rhythm:Regular Rate:Normal  - S/P CABG     LV - baseline EF at 30%, anterior and lateral wall hypokinesis   with minimal to no thickening and significant hypokinesis of   inferior wall   MV - MR that is mild and central   AV - no new AI, no sign of aortic dissection  Walks 6-8 miles/day   Neuro/Psych negative neurological ROS  negative psych ROS   GI/Hepatic negative GI ROS, Neg liver ROS,   Endo/Other  negative endocrine ROS  Renal/GU negative Renal ROS  negative genitourinary   Musculoskeletal negative musculoskeletal ROS (+)   Abdominal   Peds negative pediatric ROS (+)  Hematology negative hematology ROS (+)   Anesthesia Other Findings   Reproductive/Obstetrics negative OB ROS                            Anesthesia Physical Anesthesia Plan  ASA: IV  Anesthesia Plan: General   Post-op Pain Management:    Induction: Intravenous  Airway Management Planned: Oral ETT  Additional Equipment:   Intra-op Plan:   Post-operative Plan: Extubation in OR  Informed Consent: I have reviewed the patients History and Physical, chart, labs and discussed the procedure including the risks, benefits and alternatives for the proposed anesthesia with the patient or authorized representative who has indicated his/her understanding and acceptance.   Dental advisory given  Plan Discussed with: CRNA and Surgeon  Anesthesia Plan Comments:          Anesthesia Quick Evaluation

## 2016-02-09 NOTE — Anesthesia Procedure Notes (Signed)
Procedure Name: Intubation Date/Time: 02/09/2016 9:09 AM Performed by: Jacquiline Doe A Pre-anesthesia Checklist: Patient identified, Emergency Drugs available, Suction available and Patient being monitored Patient Re-evaluated:Patient Re-evaluated prior to inductionOxygen Delivery Method: Circle System Utilized Preoxygenation: Pre-oxygenation with 100% oxygen Intubation Type: IV induction and Cricoid Pressure applied Ventilation: Mask ventilation without difficulty Laryngoscope Size: Mac and 4 Grade View: Grade II Tube type: Oral Tube size: 7.5 mm Number of attempts: 1 Airway Equipment and Method: Stylet,  Oral airway and LTA kit utilized Placement Confirmation: ETT inserted through vocal cords under direct vision,  positive ETCO2 and breath sounds checked- equal and bilateral Secured at: 23 cm Tube secured with: Tape Dental Injury: Teeth and Oropharynx as per pre-operative assessment

## 2016-02-09 NOTE — Transfer of Care (Signed)
Immediate Anesthesia Transfer of Care Note  Patient: GRASON DRAWHORN  Procedure(s) Performed: Procedure(s): LEFT CAROTID ENDARTERECTOMY (Left) PATCH ANGIOPLASTY USING XENOSURE BIOLOGIC PATCH (Left)  Patient Location: PACU  Anesthesia Type:General  Level of Consciousness: awake, oriented, sedated, patient cooperative and responds to stimulation  Airway & Oxygen Therapy: Patient Spontanous Breathing  Post-op Assessment: Report given to RN, Post -op Vital signs reviewed and stable, Patient moving all extremities, Patient moving all extremities X 4 and Patient able to stick tongue midline  Post vital signs: Reviewed and stable  Last Vitals:  Vitals:   02/09/16 0647  BP: 123/66  Pulse: 65  Resp: 20  Temp: 36.4 C    Last Pain:  Vitals:   02/09/16 0647  TempSrc: Oral      Patients Stated Pain Goal: 3 ([id number])  Complications: No apparent anesthesia complications

## 2016-02-09 NOTE — Interval H&P Note (Signed)
History and Physical Interval Note:  02/09/2016 8:21 AM  Nicole Kindred  has presented today for surgery, with the diagnosis of Left carotid artery stenosis I65.22  The various methods of treatment have been discussed with the patient and family. After consideration of risks, benefits and other options for treatment, the patient has consented to  Procedure(s): ENDARTERECTOMY CAROTID (Left) as a surgical intervention .  The patient's history has been reviewed, patient examined, no change in status, stable for surgery.  I have reviewed the patient's chart and labs.  Questions were answered to the patient's satisfaction.     Annamarie Major

## 2016-02-10 ENCOUNTER — Encounter (HOSPITAL_COMMUNITY): Payer: Self-pay | Admitting: Surgery

## 2016-02-10 LAB — CBC
HEMATOCRIT: 36.9 % — AB (ref 39.0–52.0)
Hemoglobin: 12 g/dL — ABNORMAL LOW (ref 13.0–17.0)
MCH: 29.5 pg (ref 26.0–34.0)
MCHC: 32.5 g/dL (ref 30.0–36.0)
MCV: 90.7 fL (ref 78.0–100.0)
Platelets: 218 10*3/uL (ref 150–400)
RBC: 4.07 MIL/uL — AB (ref 4.22–5.81)
RDW: 15 % (ref 11.5–15.5)
WBC: 17.3 10*3/uL — AB (ref 4.0–10.5)

## 2016-02-10 LAB — BASIC METABOLIC PANEL
ANION GAP: 8 (ref 5–15)
BUN: 14 mg/dL (ref 6–20)
CHLORIDE: 112 mmol/L — AB (ref 101–111)
CO2: 20 mmol/L — AB (ref 22–32)
Calcium: 7.9 mg/dL — ABNORMAL LOW (ref 8.9–10.3)
Creatinine, Ser: 0.75 mg/dL (ref 0.61–1.24)
GFR calc Af Amer: >60 mL/min (ref >60)
GLUCOSE: 152 mg/dL — AB (ref 65–99)
POTASSIUM: 3.8 mmol/L (ref 3.5–5.1)
Sodium: 140 mmol/L (ref 135–145)

## 2016-02-10 MED ORDER — OXYCODONE-ACETAMINOPHEN 5-325 MG PO TABS: 1.0000 | ORAL_TABLET | ORAL | 0 refills | Status: DC | PRN

## 2016-02-10 MED ORDER — POTASSIUM CHLORIDE CRYS ER 20 MEQ PO TBCR: 20.0000 meq | EXTENDED_RELEASE_TABLET | Freq: Every day | ORAL | 0 refills | Status: DC

## 2016-02-10 NOTE — Care Management Note (Signed)
Case Management Note  Patient Details  Name: [name] [name] MRN: [id number] Date of Birth: Aug 04, 1960  Subjective/Objective:      Patient is from home, s/p left carotid Endarterectomy .  Patient for discharge today, no needs.               Action/Plan:   Expected Discharge Date:                  Expected Discharge Plan:  Home/Self Care  In-House Referral:     Discharge planning Services  CM Consult  Post Acute Care Choice:    Choice offered to:     DME Arranged:    DME Agency:     HH Arranged:    HH Agency:     Status of Service:  Completed, signed off  If discussed at [employer] of Stay Meetings, dates discussed:    Additional Comments:  Zenon Mayo, RN 02/10/2016, 3:09 PM

## 2016-02-10 NOTE — Progress Notes (Signed)
    Subjective  - POD #1, s/op L CEA  Pain controlled Ambulating, voiding and eating   Physical Exam:  Slight marginal mandibular neuropraxia, o/w neuro intact Incision soft and dry       Assessment/Plan:  POD #1  Stable for d/c home F/u 2 weeks  [place], [place] 02/10/2016 6:46 AM --  Vitals:   02/09/16 2238 02/10/16 0336  BP: (!) 108/59 116/73  Pulse: 68 71  Resp: 17 13  Temp: 98 F (36.7 C) 97.5 F (36.4 C)    Intake/Output Summary (Last 24 hours) at 02/10/16 0646 Last data filed at 02/10/16 0500  Gross per 24 hour  Intake           2567.5 ml  Output               50 ml  Net           2517.5 ml     Laboratory CBC    Component Value Date/Time   WBC 17.3 (H) 02/10/2016 0441   HGB 12.0 (L) 02/10/2016 0441   HCT 36.9 (L) 02/10/2016 0441   PLT 218 02/10/2016 0441    BMET    Component Value Date/Time   NA 140 02/10/2016 0441   K 3.8 02/10/2016 0441   CL 112 (H) 02/10/2016 0441   CO2 20 (L) 02/10/2016 0441   GLUCOSE 152 (H) 02/10/2016 0441   BUN 14 02/10/2016 0441   CREATININE 0.75 02/10/2016 0441   CALCIUM 7.9 (L) 02/10/2016 0441   GFRNONAA >60 02/10/2016 0441   GFRAA >60 02/10/2016 0441    COAG Lab Results  Component Value Date   INR 1.09 02/07/2016   INR 2.0 01/31/2016   INR 2.0 01/17/2016   No results found for: PTT  Antibiotics Anti-infectives    Start     Dose/Rate Route Frequency Ordered Stop   02/09/16 2100  cefUROXime (ZINACEF) 1.5 g in dextrose 5 % 50 mL IVPB     1.5 g 100 mL/hr over 30 Minutes Intravenous Every 12 hours 02/09/16 1705 02/10/16 2059   02/09/16 0745  cefUROXime (ZINACEF) 1.5 g in dextrose 5 % 50 mL IVPB     1.5 g 100 mL/hr over 30 Minutes Intravenous To ShortStay [facility] 02/08/16 0843 02/09/16 0940       V. Leia Alf, M.D. [facility] [place] Office: [phone] Pager:  [phone]

## 2016-02-10 NOTE — Discharge Summary (Signed)
[facility] Discharge Summary  [name] [name] 08-Feb-1961 55 y.o. male  [id number]  Admission Date: 02/09/2016  Discharge Date: 02/10/2016  Physician: Annamarie Major, MD  Admission Diagnosis: Left carotid artery stenosis I65.22  HPI:   This is a 55 y.o. male who presented to the hospital on 12/13/2015 with increased heart rate and abnormal EKG. He states that he has been having right flank pain for several weeks which has become progressively more towards the epigastrium. He also began complaining of shortness of breath, leg swelling, orthopnea and intermittent chest pain. His troponins were elevated. He underwent cardiac catheterization which revealed severe multivessel disease. His ejection fraction was approximately 20-25 percent. He did have significant fluid weight gain which has improved with diuresis. His symptoms have improved. He has been scheduled for coronary bypass. During his pre-CABG workup he was found to have a high-grade left carotid stenosis on ultrasound. The patient is asymptomatic. Specifically, he denies numbness or weakness in either extremity. He denies slurred speech. He denies amaurosis fugax.  The patient suffers from long-standing tobacco abuse. He is now on a statin for hypercholesterolemia. He did go into A. fib with rapid ventricular response which converted with diltiazem back into sinus rhythm.  He underwent CABG approximately one month ago.  He has done very well and is recovering nicely.  He is on Coumadin for atrial fibrillation.  He denies neurologic symptoms since his CABG.  Hospital Course:  The patient was admitted to the hospital and taken to the operating room on 02/09/2016 and underwent left carotid endarterectomy.  The patient tolerated the procedure well and was transported to the PACU in stable condition.  By POD 1, the patient's neuro status was intact except for a mild left marginal mandibular neuropraxia. His  left neck incision was clean and intact without hematoma. He was tolerating a diet and ambulating without difficulty. He was discharged home on POD 1 in good condition.     Recent Labs  02/10/16 0441  NA 140  K 3.8  CL 112*  CO2 20*  GLUCOSE 152*  BUN 14  CALCIUM 7.9*    Recent Labs  02/09/16 1900 02/10/16 0441  WBC 8.3 17.3*  HGB 13.1 12.0*  HCT 40.5 36.9*  PLT 231 218   No results for input(s): INR in the last 72 hours.  Discharge Instructions:   The patient is discharged to home with extensive instructions on wound care and progressive ambulation.  They are instructed not to drive or perform any heavy lifting until returning to see the physician in his office.  Discharge Instructions    CAROTID Sugery: Call MD for difficulty swallowing or speaking; weakness in arms or legs that is a new symtom; severe headache.  If you have increased swelling in the neck and/or  are having difficulty breathing, CALL [phone]    Complete by:  As directed   Call MD for:  redness, tenderness, or signs of infection (pain, swelling, bleeding, redness, odor or green/yellow discharge around incision site)    Complete by:  As directed   Call MD for:  severe or increased pain, loss or decreased feeling  in affected limb(s)    Complete by:  As directed   Call MD for:  temperature >100.5    Complete by:  As directed   Discharge wound care:    Complete by:  As directed   Wash wound daily with soap and water and pat dry. Do not apply any creams or ointments on  your incisions.   Driving Restrictions    Complete by:  As directed   No driving for 2 weeks   Increase activity slowly    Complete by:  As directed   Walk with assistance use walker or cane as needed   Lifting restrictions    Complete by:  As directed   No lifting for 1 week   Resume previous diet    Complete by:  As directed      Discharge Diagnosis:  Left carotid artery stenosis I65.22  Secondary Diagnosis: Patient Active Problem List    Diagnosis Date Noted  . Carotid stenosis, asymptomatic 02/09/2016  . Encounter for therapeutic drug monitoring 12/23/2015  . 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 Acute systolic congestive heart failure ([place])   . Hypercholesterolemia 12/15/2015  . Elevated troponin 12/15/2015  . Coronary artery disease involving native coronary artery with unstable angina pectoris ([place]) 12/15/2015  . NSTEMI (non-ST elevated myocardial infarction) ([place])   . Cardiomyopathy, ischemic 12/14/2015  . Mitral regurgitation 12/14/2015  . Atrial fibrillation with RVR ([place]) 12/13/2015  . Atrial fibrillation with rapid ventricular response ([place]) 12/13/2015  . Cardiomyopathy ([place]) 12/13/2015  . Leukocytosis 12/13/2015  . Tobacco abuse    Past Medical History:  Diagnosis Date  . Acute systolic congestive heart failure ([place])   . Atrial fibrillation with rapid ventricular response ([place]) 12/13/2015  . Cardiomyopathy ([place]) 12/13/2015  . Cardiomyopathy, ischemic 12/14/2015   EF 20-25%  . Coronary artery disease involving native coronary artery with unstable angina pectoris ([place]) 12/15/2015  . GERD (gastroesophageal reflux disease)    pt. denies  . Hypercholesterolemia 12/15/2015  . Mitral regurgitation 12/14/2015   moderate  . NSTEMI (non-ST elevated myocardial infarction) ([place])   . PONV (postoperative nausea and vomiting)    "sick with very first surgery but nothing since"  . 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 Tobacco abuse       Medication List    TAKE these medications   aspirin 81 MG EC tablet Take 1 tablet (81 mg total) by mouth daily.   atorvastatin 80 MG tablet Commonly known as:  LIPITOR Take 1 tablet (80 mg total) by mouth daily at 6 PM.   carvedilol 6.25 MG tablet Commonly known as:  COREG Take 1 tablet (6.25 mg total) by mouth 2 (two) times daily with a meal.     furosemide 40 MG tablet Commonly known as:  LASIX Take 1 tablet (40 mg total) by mouth as needed.   lisinopril 2.5 MG tablet Commonly known as:  PRINIVIL,ZESTRIL Take 1 tablet (2.5 mg total) by mouth daily.   potassium chloride SA 20 MEQ tablet Commonly known as:  K-DUR,KLOR-CON Take 1 tablet (20 mEq total) by mouth daily. As needed What changed:  additional instructions      Disposition: Home  Patient's condition: is Good  Follow up: 1. Dr.  Trula Slade in 2 weeks.   Virgina Jock, PA-C [facility] [phone]  --- For [facility] use --- Instructions: Press F2 to tab through selections.  Delete question if not applicable.   Modified Rankin score at D/C (0-6): 0  IV medication needed for:  1. Hypertension: No 2. Hypotension: No  Post-op Complications: No  1. Post-op CVA or TIA: No  2. CN injury: Yes  If yes: marginal mandibular  branch of CN VII injured   3. Myocardial infarction: No  4.  CHF: No  5.  Dysrhythmia (new): No  6. Wound infection: No  7. Reperfusion symptoms: No  8. Return to OR: No  Discharge medications: Statin use:  Yes If No: [ ]  For Medical reasons, [ ]  Non-compliant, [ ]  Not-indicated ASA use:  Yes  If No: [ ]  For Medical reasons, [ ]  Non-compliant, [ ]  Not-indicated Beta blocker use:  Yes If No: [ ]  For Medical reasons, [ ]  Non-compliant, [ ]  Not-indicated ACE-Inhibitor use:  Yes If No: [ ]  For Medical reasons, [ ]  Non-compliant, [ ]  Not-indicated P2Y12 Antagonist use: No, [ ]  Plavix, [ ]  Plasugrel, [ ]  Ticlopinine, [ ]  Ticagrelor, [ ]  Other, [ ]  No for medical reason, [ ]  Non-compliant, [x ] Not-indicated Anti-coagulant use:  No, [ ]  Warfarin, [ ]  Rivaroxaban, [ ]  Dabigatran, [ ]  Other, [ ]  No for medical reason, [ ]  Non-compliant, [x ] Not-indicated

## 2016-02-16 ENCOUNTER — Telehealth: Payer: Self-pay | Admitting: Surgery

## 2016-02-16 NOTE — Telephone Encounter (Signed)
-----   Message from Mena Goes, RN sent at 02/10/2016 11:32 AM EDT ----- Regarding: post CEA 2 weeks   ----- Message ----- From: Alvia Grove, PA-C Sent: 02/10/2016   7:21 AM To: Vvs Charge Pool  S/p left CEA 02/09/16  F/u with Dr. Trula Slade in 2 weeks.   Thanks Maudie Mercury

## 2016-02-16 NOTE — Telephone Encounter (Signed)
Spoke to pt to sch appt 8/21 at 4:15.

## 2016-02-18 ENCOUNTER — Encounter: Payer: Self-pay | Admitting: Surgery

## 2016-02-21 ENCOUNTER — Encounter: Payer: Self-pay | Admitting: Surgery

## 2016-02-21 ENCOUNTER — Ambulatory Visit (INDEPENDENT_AMBULATORY_CARE_PROVIDER_SITE_OTHER): Payer: Self-pay | Admitting: Surgery

## 2016-02-21 VITALS — BP 135/79 | HR 70 | Temp 97.6°F | Resp 20 | Ht 72.0 in | Wt 225.0 lb

## 2016-02-21 DIAGNOSIS — I6522 Occlusion and stenosis of left carotid artery: Secondary | ICD-10-CM

## 2016-02-21 NOTE — Progress Notes (Signed)
   Patient name: [name] [name] MRN: [id number] DOB: [birth year] Sex: male  REASON FOR VISIT: post-op  HPI: [name] [name] is a 55 y.o. male who returns today for follow-up.  On 02/09/2016 he underwent left carotid endarterectomy with bovine pericardial patch angioplasty.  Intraoperative findings included an 80% stenosis.  He was discharged home on postoperative day 1.  He continues to recover nicely.  He does complain of some swelling in his neck and slight drooping of the left side of his mouth.  He is exercising frequently, walking more than 6 miles per day.  He started working again today.  Current Outpatient Prescriptions  Medication Sig Dispense Refill  . aspirin 81 MG EC tablet Take 1 tablet (81 mg total) by mouth daily. 30 tablet   . atorvastatin (LIPITOR) 80 MG tablet Take 1 tablet (80 mg total) by mouth daily at 6 PM. 30 tablet 3  . carvedilol (COREG) 6.25 MG tablet Take 1 tablet (6.25 mg total) by mouth 2 (two) times daily with a meal. 60 tablet 3  . lisinopril (PRINIVIL,ZESTRIL) 2.5 MG tablet Take 1 tablet (2.5 mg total) by mouth daily. 30 tablet 3  . furosemide (LASIX) 40 MG tablet Take 1 tablet (40 mg total) by mouth as needed. (Patient not taking: Reported on 02/21/2016) 90 tablet 3  . potassium chloride SA (K-DUR,KLOR-CON) 20 MEQ tablet Take 1 tablet (20 mEq total) by mouth daily. As needed (Patient not taking: Reported on 02/21/2016) 30 tablet 0   No current facility-administered medications for this visit.     REVIEW OF SYSTEMS:  [X]  denotes positive finding, [ ]  denotes negative finding Cardiac  Comments:  Chest pain or chest pressure:    Shortness of breath upon exertion:    Short of breath when lying flat:    Irregular heart rhythm:    Constitutional    Fever or chills:      PHYSICAL EXAM: Vitals:   02/21/16 1610 02/21/16 1616  BP: 131/74 135/79  Pulse: 70   Resp: 20   Temp: 97.6 F (36.4 C)   TempSrc: Oral   SpO2: 96%     Weight: 225 lb (102.1 kg)   Height: 6' (1.829 m)     GENERAL: The patient is a well-nourished male, in no acute distress. The vital signs are documented above. CARDIOVASCULAR: There is a regular rate and rhythm. PULMONARY: There is good air exchange bilaterally without wheezing or rales. Resolving hematoma within the left neck incision which remains intact.  He has a slight left marginal mandibular neurapraxia which is significantly better than immediately postop.  MEDICAL ISSUES: Doing very well, status post left carotid endarterectomy.  He will follow up in 9 months for repeat surveillance imaging.  He was applauded for not smoking.  Annamarie Major, MD [facility] [phone] Pager [phone]

## 2016-02-24 ENCOUNTER — Other Ambulatory Visit: Payer: Self-pay | Admitting: *Deleted

## 2016-02-24 DIAGNOSIS — I6523 Occlusion and stenosis of bilateral carotid arteries: Secondary | ICD-10-CM

## 2016-03-20 ENCOUNTER — Encounter: Payer: 59 | Admitting: Thoracic Surgery (Cardiothoracic Vascular Surgery)

## 2016-04-02 ENCOUNTER — Encounter (HOSPITAL_COMMUNITY): Payer: Self-pay | Admitting: Emergency Medicine

## 2016-04-02 ENCOUNTER — Inpatient Hospital Stay (HOSPITAL_COMMUNITY)
Admission: EM | Admit: 2016-04-02 | Discharge: 2016-04-03 | DRG: 304 | Disposition: A | Discharge status: Home / Self Care | Payer: 59 | Attending: Cardiology | Admitting: Cardiology

## 2016-04-02 ENCOUNTER — Emergency Department (HOSPITAL_COMMUNITY): Payer: 59

## 2016-04-02 DIAGNOSIS — Z720 Tobacco use: Secondary | ICD-10-CM | POA: Diagnosis present

## 2016-04-02 DIAGNOSIS — I255 Ischemic cardiomyopathy: Secondary | ICD-10-CM | POA: Diagnosis present

## 2016-04-02 DIAGNOSIS — I252 Old myocardial infarction: Secondary | ICD-10-CM | POA: Diagnosis not present

## 2016-04-02 DIAGNOSIS — I480 Paroxysmal atrial fibrillation: Secondary | ICD-10-CM | POA: Diagnosis present

## 2016-04-02 DIAGNOSIS — Z7982 Long term (current) use of aspirin: Secondary | ICD-10-CM

## 2016-04-02 DIAGNOSIS — J9601 Acute respiratory failure with hypoxia: Secondary | ICD-10-CM | POA: Diagnosis not present

## 2016-04-02 DIAGNOSIS — E7800 Pure hypercholesterolemia, unspecified: Secondary | ICD-10-CM | POA: Diagnosis present

## 2016-04-02 DIAGNOSIS — I5043 Acute on chronic combined systolic (congestive) and diastolic (congestive) heart failure: Secondary | ICD-10-CM | POA: Diagnosis not present

## 2016-04-02 DIAGNOSIS — I5023 Acute on chronic systolic (congestive) heart failure: Secondary | ICD-10-CM | POA: Diagnosis not present

## 2016-04-02 DIAGNOSIS — Z951 Presence of aortocoronary bypass graft: Secondary | ICD-10-CM | POA: Diagnosis not present

## 2016-04-02 DIAGNOSIS — I340 Nonrheumatic mitral (valve) insufficiency: Secondary | ICD-10-CM | POA: Diagnosis present

## 2016-04-02 DIAGNOSIS — I2510 Atherosclerotic heart disease of native coronary artery without angina pectoris: Secondary | ICD-10-CM | POA: Diagnosis present

## 2016-04-02 DIAGNOSIS — F17210 Nicotine dependence, cigarettes, uncomplicated: Secondary | ICD-10-CM | POA: Diagnosis present

## 2016-04-02 DIAGNOSIS — I4891 Unspecified atrial fibrillation: Secondary | ICD-10-CM | POA: Diagnosis present

## 2016-04-02 DIAGNOSIS — R079 Chest pain, unspecified: Secondary | ICD-10-CM | POA: Diagnosis not present

## 2016-04-02 DIAGNOSIS — N179 Acute kidney failure, unspecified: Secondary | ICD-10-CM | POA: Diagnosis not present

## 2016-04-02 DIAGNOSIS — R0682 Tachypnea, not elsewhere classified: Secondary | ICD-10-CM | POA: Diagnosis not present

## 2016-04-02 DIAGNOSIS — K219 Gastro-esophageal reflux disease without esophagitis: Secondary | ICD-10-CM | POA: Diagnosis present

## 2016-04-02 DIAGNOSIS — E785 Hyperlipidemia, unspecified: Secondary | ICD-10-CM | POA: Diagnosis present

## 2016-04-02 DIAGNOSIS — Z79899 Other long term (current) drug therapy: Secondary | ICD-10-CM

## 2016-04-02 DIAGNOSIS — I110 Hypertensive heart disease with heart failure: Secondary | ICD-10-CM | POA: Diagnosis present

## 2016-04-02 DIAGNOSIS — R0603 Acute respiratory distress: Secondary | ICD-10-CM | POA: Diagnosis not present

## 2016-04-02 DIAGNOSIS — D72829 Elevated white blood cell count, unspecified: Secondary | ICD-10-CM | POA: Diagnosis present

## 2016-04-02 DIAGNOSIS — I161 Hypertensive emergency: Principal | ICD-10-CM | POA: Diagnosis present

## 2016-04-02 DIAGNOSIS — R0602 Shortness of breath: Secondary | ICD-10-CM | POA: Diagnosis not present

## 2016-04-02 HISTORY — DX: Acute on chronic combined systolic (congestive) and diastolic (congestive) heart failure: I50.43

## 2016-04-02 LAB — BASIC METABOLIC PANEL
Anion gap: 12 (ref 5–15)
Anion gap: 7 (ref 5–15)
Anion gap: 10 (ref 5–15)
BUN: 15 mg/dL (ref 6–20)
BUN: 17 mg/dL (ref 6–20)
BUN: 19 mg/dL (ref 6–20)
CALCIUM: 8.5 mg/dL — AB (ref 8.9–10.3)
CALCIUM: 8.7 mg/dL — AB (ref 8.9–10.3)
CHLORIDE: 107 mmol/L (ref 101–111)
CO2: 20 mmol/L — AB (ref 22–32)
CO2: 29 mmol/L (ref 22–32)
CO2: 27 mmol/L (ref 22–32)
CREATININE: 1.33 mg/dL — AB (ref 0.61–1.24)
CREATININE: 1.31 mg/dL — AB (ref 0.61–1.24)
CREATININE: 1.31 mg/dL — AB (ref 0.61–1.24)
Calcium: 8.9 mg/dL (ref 8.9–10.3)
Chloride: 105 mmol/L (ref 101–111)
Chloride: 103 mmol/L (ref 101–111)
GFR calc Af Amer: >60 mL/min (ref >60)
GFR calc Af Amer: >60 mL/min (ref >60)
GFR calc Af Amer: >60 mL/min (ref >60)
GFR calc non Af Amer: 60 mL/min — ABNORMAL LOW (ref >60)
GLUCOSE: 256 mg/dL — AB (ref 65–99)
GLUCOSE: 99 mg/dL (ref 65–99)
Glucose, Bld: 135 mg/dL — ABNORMAL HIGH (ref 65–99)
Potassium: 4.4 mmol/L (ref 3.5–5.1)
Potassium: 4.6 mmol/L (ref 3.5–5.1)
Potassium: 3.9 mmol/L (ref 3.5–5.1)
SODIUM: 141 mmol/L (ref 135–145)
SODIUM: 140 mmol/L (ref 135–145)
Sodium: 139 mmol/L (ref 135–145)

## 2016-04-02 LAB — COMPREHENSIVE METABOLIC PANEL
ALK PHOS: 128 U/L — AB (ref 38–126)
ALT: 21 U/L (ref 17–63)
AST: 23 U/L (ref 15–41)
Albumin: 3.6 g/dL (ref 3.5–5.0)
Anion gap: 7 (ref 5–15)
BILIRUBIN TOTAL: 1.1 mg/dL (ref 0.3–1.2)
BUN: 16 mg/dL (ref 6–20)
CALCIUM: 8.8 mg/dL — AB (ref 8.9–10.3)
CO2: 27 mmol/L (ref 22–32)
Chloride: 108 mmol/L (ref 101–111)
Creatinine, Ser: 1.38 mg/dL — ABNORMAL HIGH (ref 0.61–1.24)
GFR calc Af Amer: >60 mL/min (ref >60)
GFR, EST NON AFRICAN AMERICAN: 57 mL/min — AB (ref >60)
Glucose, Bld: 137 mg/dL — ABNORMAL HIGH (ref 65–99)
POTASSIUM: 5 mmol/L (ref 3.5–5.1)
Sodium: 142 mmol/L (ref 135–145)
TOTAL PROTEIN: 6.8 g/dL (ref 6.5–8.1)

## 2016-04-02 LAB — CBC
HEMATOCRIT: 48.7 % (ref 39.0–52.0)
Hemoglobin: 16.3 g/dL (ref 13.0–17.0)
MCH: 30.6 pg (ref 26.0–34.0)
MCHC: 33.5 g/dL (ref 30.0–36.0)
MCV: 91.5 fL (ref 78.0–100.0)
PLATELETS: 222 10*3/uL (ref 150–400)
RBC: 5.32 MIL/uL (ref 4.22–5.81)
RDW: 15.6 % — AB (ref 11.5–15.5)
WBC: 19.6 10*3/uL — AB (ref 4.0–10.5)

## 2016-04-02 LAB — CBC WITH DIFFERENTIAL/PLATELET
Basophils Absolute: 0.1 10*3/uL (ref 0.0–0.1)
Basophils Relative: 0 %
Eosinophils Absolute: 0.3 10*3/uL (ref 0.0–0.7)
Eosinophils Relative: 2 %
HEMATOCRIT: 52.5 % — AB (ref 39.0–52.0)
Hemoglobin: 17.2 g/dL — ABNORMAL HIGH (ref 13.0–17.0)
LYMPHS PCT: 32 %
Lymphs Abs: 5.2 10*3/uL — ABNORMAL HIGH (ref 0.7–4.0)
MCH: 30.1 pg (ref 26.0–34.0)
MCHC: 32.8 g/dL (ref 30.0–36.0)
MCV: 91.8 fL (ref 78.0–100.0)
MONO ABS: 1.2 10*3/uL — AB (ref 0.1–1.0)
MONOS PCT: 7 %
NEUTROS ABS: 9.6 10*3/uL — AB (ref 1.7–7.7)
Neutrophils Relative %: 59 %
Platelets: 277 10*3/uL (ref 150–400)
RBC: 5.72 MIL/uL (ref 4.22–5.81)
RDW: 15.4 % (ref 11.5–15.5)
WBC: 16.4 10*3/uL — ABNORMAL HIGH (ref 4.0–10.5)

## 2016-04-02 LAB — PROTIME-INR
INR: 1.03
PROTHROMBIN TIME: 13.6 s (ref 11.4–15.2)

## 2016-04-02 LAB — TROPONIN I
TROPONIN I: 0.06 ng/mL — AB (ref <0.03)
TROPONIN I: 0.05 ng/mL — AB (ref <0.03)
Troponin I: 0.06 ng/mL (ref <0.03)

## 2016-04-02 LAB — I-STAT TROPONIN, ED: Troponin i, poc: 0 ng/mL (ref 0.00–0.08)

## 2016-04-02 LAB — BRAIN NATRIURETIC PEPTIDE
B NATRIURETIC PEPTIDE 5: 325.4 pg/mL — AB (ref 0.0–100.0)
B NATRIURETIC PEPTIDE 5: 393.4 pg/mL — AB (ref 0.0–100.0)

## 2016-04-02 LAB — HEPARIN LEVEL (UNFRACTIONATED)
HEPARIN UNFRACTIONATED: 0.16 [IU]/mL — AB (ref 0.30–0.70)
Heparin Unfractionated: 0.34 IU/mL (ref 0.30–0.70)

## 2016-04-02 LAB — TSH: TSH: 5.527 u[IU]/mL — ABNORMAL HIGH (ref 0.350–4.500)

## 2016-04-02 LAB — MRSA PCR SCREENING: MRSA by PCR: NEGATIVE

## 2016-04-02 MED ORDER — HEPARIN BOLUS VIA INFUSION
2000.0000 [IU] | Freq: Once | INTRAVENOUS | Status: AC
  Administered 2016-04-02: 2000 [IU] via INTRAVENOUS
  Filled 2016-04-02: qty 2000

## 2016-04-02 MED ORDER — FUROSEMIDE 10 MG/ML IJ SOLN
80.0000 mg | Freq: Once | INTRAMUSCULAR | Status: AC
  Administered 2016-04-02: 80 mg via INTRAVENOUS

## 2016-04-02 MED ORDER — ONDANSETRON HCL 4 MG/2ML IJ SOLN
4.0000 mg | Freq: Once | INTRAMUSCULAR | Status: AC
  Administered 2016-04-02: 4 mg via INTRAVENOUS

## 2016-04-02 MED ORDER — CARVEDILOL 6.25 MG PO TABS
6.2500 mg | ORAL_TABLET | Freq: Two times a day (BID) | ORAL | Status: DC
  Administered 2016-04-02 – 2016-04-03 (×3): 6.25 mg via ORAL
  Filled 2016-04-02 (×4): qty 1

## 2016-04-02 MED ORDER — INFLUENZA VAC SPLIT QUAD 0.5 ML IM SUSY: 0.5000 mL | PREFILLED_SYRINGE | INTRAMUSCULAR | Status: DC

## 2016-04-02 MED ORDER — ATORVASTATIN CALCIUM 80 MG PO TABS
80.0000 mg | ORAL_TABLET | Freq: Every day | ORAL | Status: DC
  Administered 2016-04-02: 80 mg via ORAL
  Filled 2016-04-02: qty 1

## 2016-04-02 MED ORDER — FUROSEMIDE 10 MG/ML IJ SOLN
40.0000 mg | Freq: Four times a day (QID) | INTRAMUSCULAR | Status: AC
  Administered 2016-04-02 (×3): 40 mg via INTRAVENOUS
  Filled 2016-04-02 (×3): qty 4

## 2016-04-02 MED ORDER — FUROSEMIDE 10 MG/ML IJ SOLN
INTRAMUSCULAR | Status: AC
  Filled 2016-04-02: qty 8

## 2016-04-02 MED ORDER — HEPARIN (PORCINE) IN NACL 100-0.45 UNIT/ML-% IJ SOLN
1500.0000 [IU]/h | INTRAMUSCULAR | Status: DC
  Administered 2016-04-02: 1200 [IU]/h via INTRAVENOUS
  Administered 2016-04-02: 1500 [IU]/h via INTRAVENOUS
  Filled 2016-04-02 (×2): qty 250

## 2016-04-02 MED ORDER — ASPIRIN EC 81 MG PO TBEC
81.0000 mg | DELAYED_RELEASE_TABLET | Freq: Every day | ORAL | Status: DC
  Administered 2016-04-02 – 2016-04-03 (×2): 81 mg via ORAL
  Filled 2016-04-02 (×2): qty 1

## 2016-04-02 MED ORDER — LISINOPRIL 2.5 MG PO TABS
2.5000 mg | ORAL_TABLET | Freq: Every day | ORAL | Status: DC
  Administered 2016-04-02 – 2016-04-03 (×2): 2.5 mg via ORAL
  Filled 2016-04-02 (×2): qty 1

## 2016-04-02 MED ORDER — NITROGLYCERIN IN D5W 200-5 MCG/ML-% IV SOLN: 2.0000 ug/min | INTRAVENOUS | Status: DC

## 2016-04-02 MED ORDER — NICARDIPINE HCL IN NACL 20-0.86 MG/200ML-% IV SOLN
3.0000 mg/h | Freq: Once | INTRAVENOUS | Status: AC
  Administered 2016-04-02: 5 mg/h via INTRAVENOUS

## 2016-04-02 MED ORDER — NITROGLYCERIN IN D5W 200-5 MCG/ML-% IV SOLN
INTRAVENOUS | Status: AC
  Filled 2016-04-02: qty 250

## 2016-04-02 MED ORDER — NICARDIPINE HCL IN NACL 20-0.86 MG/200ML-% IV SOLN
INTRAVENOUS | Status: AC
  Filled 2016-04-02: qty 200

## 2016-04-02 MED ORDER — HEPARIN BOLUS VIA INFUSION
4000.0000 [IU] | Freq: Once | INTRAVENOUS | Status: AC
  Administered 2016-04-02: 4000 [IU] via INTRAVENOUS
  Filled 2016-04-02: qty 4000

## 2016-04-02 MED ORDER — SODIUM CHLORIDE 0.9% FLUSH
3.0000 mL | Freq: Two times a day (BID) | INTRAVENOUS | Status: DC
  Administered 2016-04-02 – 2016-04-03 (×3): 3 mL via INTRAVENOUS

## 2016-04-02 MED ORDER — ONDANSETRON HCL 4 MG/2ML IJ SOLN
INTRAMUSCULAR | Status: AC
  Administered 2016-04-02: 4 mg
  Filled 2016-04-02: qty 2

## 2016-04-02 NOTE — Progress Notes (Signed)
ANTICOAGULATION CONSULT NOTE - Follow Up Consult  Pharmacy Consult for heparin Indication: atrial fibrillation  Allergies  Allergen Reactions  . No Known Allergies Other (See Comments)    Patient Measurements: Height: 6' (182.9 cm) Weight: 225 lb 12 oz (102.4 kg) IBW/kg (Calculated) : 77.6 Heparin Dosing Weight: 100 kg  Vital Signs: Temp: 98.6 F (37 C) (10/01 1100) Temp Source: Oral (10/01 1100) BP: 91/55 (10/01 1100) Pulse Rate: 66 (10/01 1100)  Labs:  Recent Labs  04/02/16 0301 04/02/16 0615 04/02/16 0932 04/02/16 1125  HGB 17.2* 16.3  --   --   HCT 52.5* 48.7  --   --   PLT 277 222  --   --   LABPROT  --  13.6  --   --   INR  --  1.03  --   --   HEPARINUNFRC  --   --   --  0.16*  CREATININE 1.33* 1.38* 1.31*  --   TROPONINI  --  0.06*  --  0.05*    Estimated Creatinine Clearance: 80.7 mL/min (by C-G formula based on SCr of 1.31 mg/dL (H)).  Assessment:  55 y/o M s/p CABG in 12/2015 w/ CHF and afib. Pharmacy consulted to dose heparin. Heparin level is currently subtherapeutic at 0.16. CBC stable.   Goal of Therapy:  Heparin level 0.3-0.7 units/ml Monitor platelets by anticoagulation protocol: Yes   Plan:  Heparin IV bolus 2000u, increase heparin gtt to 1500 units/hr 6 hr heparin level Daily heparin level, CBC Monitor for S&S of bleed  Angela Burke, PharmD Pharmacy Resident Pager: [phone] 04/02/2016,1:05 PM

## 2016-04-02 NOTE — ED Notes (Addendum)
Pt off bipap for water sips. Pt states he does not have to urinate at this time. Pt placed on 4L by [place] O2

## 2016-04-02 NOTE — H&P (Signed)
CARDIOLOGY INPATIENT HISTORY AND PHYSICAL EXAMINATION NOTE  Patient ID: [name] [name] MRN: [id number], DOB/AGE: 03/24/1961   Admit date: 04/02/2016   Primary Physician: Mickie Hillier, MD Primary Cardiologist: Dr. Roena Malady  Reason for admission: Dyspnea  HPI: This is a 55 y.o. male with 3 v disease s/p CABG, HTN, ischemic CMP LVEF 20%, mild to mod functional MR, HTN, HLD, tobacco abuse presented with suddent onset of SOB.  Patient was in his usual state of health when he and his wife were trying to have intercourse when he suddenly developed SOB around 11 PM. 911 was called and initial EKG was concerning for possible anterior STEMI however it was in the setting of afib and appeared to be repolarization changes with non specific interventricular conduction block. Patient was cyanotic and was placed on bipap on arrival with improvement in hypoxia. He has been having some leg swelling lately. He is compliant with salt/fluid intake. His wt ranges from 222-226 lbs. No  Recent changes.   Of note, he mentioned that he was walking 6-8 miles a day but gets SOB on walking up 1 flight of stairs. He is compliant with medications. No recent meds changes. He had developed post CABG afib but his coumadin was stopped because it was considered transient secondary afib. No recent CHF admissions.   Problem List: Past Medical History:  Diagnosis Date  . Acute on chronic combined systolic and diastolic CHF, NYHA class 4 ([place]) 04/02/2016  . Acute systolic congestive heart failure ([place])   . Atrial fibrillation with rapid ventricular response ([place]) 12/13/2015  . Cardiomyopathy ([place]) 12/13/2015  . Cardiomyopathy, ischemic 12/14/2015   EF 20-25%  . Coronary artery disease involving native coronary artery with unstable angina pectoris ([place]) 12/15/2015  . GERD (gastroesophageal reflux disease)    pt. denies  . Hypercholesterolemia 12/15/2015  . Mitral regurgitation 12/14/2015   moderate  . NSTEMI (non-ST  elevated myocardial infarction) ([place])   . PONV (postoperative nausea and vomiting)    "sick with very first surgery but nothing since"  . 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 Tobacco abuse     Past Surgical History:  Procedure Laterality Date  . CARDIAC CATHETERIZATION N/A 12/15/2015   Procedure: Right/Left Heart Cath and Coronary Angiography;  Surgeon: Wellington Hampshire, MD;  Location: [facility];  Service: Cardiovascular;  Laterality: N/A;  . CLIPPING OF ATRIAL APPENDAGE Left 12/17/2015   Procedure: CLIPPING OF ATRIAL APPENDAGE;  Surgeon: [name], MD;  Location: [facility];  Service: Open Heart Surgery;  Laterality: Left;  . CORONARY ARTERY BYPASS GRAFT N/A 12/17/2015   Procedure: CORONARY ARTERY BYPASS GRAFTING (CABG) x three , using left internal mammary artery and right leg greater saphenous vein;  Surgeon: [name], MD;  Location: [facility];  Service: Open Heart Surgery;  Laterality: N/A;  . ENDARTERECTOMY Left 02/09/2016   Procedure: LEFT CAROTID ENDARTERECTOMY;  Surgeon: Serafina Mitchell, MD;  Location: [facility];  Service: Vascular;  Laterality: Left;  . KNEE ARTHROSCOPY Right 1991  . PATCH ANGIOPLASTY Left 02/09/2016   Procedure: PATCH ANGIOPLASTY USING Rueben Bash BIOLOGIC PATCH;  Surgeon: Serafina Mitchell, MD;  Location: [facility];  Service: Vascular;  Laterality: Left;     Allergies:  Allergies  Allergen Reactions  . No Known Allergies Other (See Comments)     Home Medications Current Facility-Administered Medications  Medication Dose Route Frequency Provider Last Rate Last Dose  . nitroGLYCERIN 0.2 mg/mL in dextrose 5 % infusion            Current Outpatient Prescriptions  Medication Sig Dispense Refill  . aspirin 81 MG EC tablet Take 1 tablet (81 mg total) by mouth daily. 30 tablet   . atorvastatin (LIPITOR) 80 MG tablet Take 1 tablet (80 mg total) by mouth daily at  6 PM. 30 tablet 3  . carvedilol (COREG) 6.25 MG tablet Take 1 tablet (6.25 mg total) by mouth 2 (two) times daily with a meal. 60 tablet 3  . furosemide (LASIX) 40 MG tablet Take 1 tablet (40 mg total) by mouth as needed. (Patient not taking: Reported on 02/21/2016) 90 tablet 3  . lisinopril (PRINIVIL,ZESTRIL) 2.5 MG tablet Take 1 tablet (2.5 mg total) by mouth daily. 30 tablet 3  . potassium chloride SA (K-DUR,KLOR-CON) 20 MEQ tablet Take 1 tablet (20 mEq total) by mouth daily. As needed (Patient not taking: Reported on 02/21/2016) 30 tablet 0     Family History  Problem Relation Age of Onset  . Heart attack Paternal Grandfather   . Heart failure Father   . Atrial fibrillation Father      Social History   Social History  . Marital status: Married    Spouse name: N/A  . Number of children: N/A  . Years of education: N/A   Occupational History  . Maint[employer]   Social History Main Topics  . Smoking status: Former Smoker    Packs/day: 1.00    Types: Cigarettes    Quit date: 12/13/2015  . Smokeless tobacco: Former Neurosurgeon    Quit date: 12/13/2015  . Alcohol use 0.0 oz/week     Comment: rare  . Drug use: No  . Sexual activity: Yes   Other Topics Concern  . Not on file   Social History Narrative  . No narrative on file     Review of Systems: General: negative for chills, fever, night sweats or weight changes.  Cardiovascular: dyspnea negative for dyspnea on exertion, edema, orthopnea, palpitations, paroxysmal nocturnal dyspnea or chest pain  Dermatological: negative for rash Respiratory: negative for cough or wheezing Urologic: negative for hematuria Abdominal: negative for nausea, vomiting, diarrhea, bright red blood per rectum, melena, or hematemesis Neurologic: negative for visual changes, syncope, or dizziness Endocrine: no diabetes, no hypothyroidism Immunological: no lymph adenopathy Psych: non homicidal/suicidal  Physical Exam: Vitals: BP (!)  83/62   Pulse 79   Resp 20   Ht 6' (1.829 m)   Wt 102.5 kg (226 lb)   SpO2 100%   BMI 30.65 kg/m  General: in moderate respiratory distress Neck: JVP elevated to the ear Heart: regular rate and rhythm, S1, S2, systolic grade I/VI murmur at PMI, PMI shifted laterally Lungs: bilateral coarse crackles up to the apices of the lungs bilaterally, accessory muscle use GI: non tender, non distended, bowel sounds present Extremities: peripheral cyanosis present, 1+ bilateral pitting edema, capillary refill <2 s Neuro: AAO x 3 somnolent Psych: normal affect, no anxiety   Labs:   Results for orders placed or performed during the hospital encounter of 04/02/16 (from the past 24 hour(s))  CBC with Differential/Platelet     Status: Abnormal   Collection Time: 04/02/16  3:01 AM  Result Value Ref Range   WBC 16.4 (H) 4.0 - 10.5 K/uL   RBC 5.72 4.22 - 5.81 MIL/uL   Hemoglobin 17.2 (H)  13.0 - 17.0 g/dL   HCT 52.5 (H) 39.0 - 52.0 %   MCV 91.8 78.0 - 100.0 fL   MCH 30.1 26.0 - 34.0 pg   MCHC 32.8 30.0 - 36.0 g/dL   RDW 15.4 11.5 - 15.5 %   Platelets 277 150 - 400 K/uL   Neutrophils Relative % 59 %   Neutro Abs 9.6 (H) 1.7 - 7.7 K/uL   Lymphocytes Relative 32 %   Lymphs Abs 5.2 (H) 0.7 - 4.0 K/uL   Monocytes Relative 7 %   Monocytes Absolute 1.2 (H) 0.1 - 1.0 K/uL   Eosinophils Relative 2 %   Eosinophils Absolute 0.3 0.0 - 0.7 K/uL   Basophils Relative 0 %   Basophils Absolute 0.1 0.0 - 0.1 K/uL  Basic metabolic panel     Status: Abnormal   Collection Time: 04/02/16  3:01 AM  Result Value Ref Range   Sodium 139 135 - 145 mmol/L   Potassium 4.4 3.5 - 5.1 mmol/L   Chloride 107 101 - 111 mmol/L   CO2 20 (L) 22 - 32 mmol/L   Glucose, Bld 256 (H) 65 - 99 mg/dL   BUN 15 6 - 20 mg/dL   Creatinine, Ser 1.33 (H) 0.61 - 1.24 mg/dL   Calcium 8.5 (L) 8.9 - 10.3 mg/dL   GFR calc non Af Amer 60 (L) >60 mL/min   GFR calc Af Amer >60 >60 mL/min   Anion gap 12 5 - 15  Brain natriuretic peptide      Status: Abnormal   Collection Time: 04/02/16  3:01 AM  Result Value Ref Range   B Natriuretic Peptide 325.4 (H) 0.0 - 100.0 pg/mL  I-stat troponin, ED     Status: None   Collection Time: 04/02/16  3:04 AM  Result Value Ref Range   Troponin i, poc 0.00 0.00 - 0.08 ng/mL   Comment 3             Radiology/Studies: Dg Chest Port 1 View  Result Date: 04/02/2016 CLINICAL DATA:  Congestive heart failure.  Shortness of breath. EXAM: PORTABLE CHEST 1 VIEW COMPARISON:  01/14/2016 FINDINGS: Postoperative changes in the mediastinum. Interval development of cardiac enlargement with pulmonary vascular congestion. Diffuse airspace and interstitial infiltrates in the lungs consistent with edema. No pneumothorax. No definite pleural effusions. IMPRESSION: Interval development of congestive changes with cardiac enlargement, pulmonary vascular congestion, and diffuse pulmonary edema. Electronically Signed   By: Lucienne Capers M.D.   On: 04/02/2016 03:14    EKG: initial ekG showed afib with repolarization changes  Echo: 12/14/2015 LVEF 20%, mild aortic sclerosis, PASP 51, mild LVH, mild RV systolic dysfunction, mild to moderate functional MR  Cardiac cath: 12/15/2015  There is moderate to severe left ventricular systolic dysfunction.  Ost LM to LM lesion, 70% stenosed.  Ost RCA lesion, 100% stenosed.  Mid LAD lesion, 100% stenosed.  Prox LAD to Mid LAD lesion, 90% stenosed.  Ost Cx to Prox Cx lesion, 70% stenosed.  Mid Cx lesion, 80% stenosed.   1. Significant left main and severe three-vessel coronary artery disease with ostial occlusion of the right coronary artery and occlusion of the mid LAD. 2. Moderately to severely reduced LV systolic function with an ejection fraction of 30-35%. 3. Right heart catheterization showed mild pulmonary hypertension, moderately elevated filling pressures and normal cardiac output.  Medical decision making:  Discussed care with the patient Discussed care with  the physician on the phone Reviewed labs and imaging personally Reviewed prior records  ASSESSMENT AND PLAN:  This is a 55 y.o. WM with known ischemic cardiomyopathy developed flash pulmonary edema while having intercourse and hypertensive emergency. He had transient atrial fibrillation episode which spontaneously converted. He is also in cardiorenal syndrome and is requiring bipap to maintain oxygenation.    Principal Problem:   Acute on chronic combined systolic and diastolic CHF, NYHA class 4 (HCC) Active Problems:   Atrial fibrillation with RVR (HCC)   Atrial fibrillation with rapid ventricular response (HCC)   Cardiomyopathy (H[facility])   Tobacco abuse   Leukocytosis   Hypercholesterolemia   Cardiomyopathy, ischemic   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   Hypertensive emergency   Acute kidney injury ([place])   Hypertensive emergency  - triggered by intercourse - brief episode of afib on EKG vs. MAT - in the ED patient received nicardipine with sudden drop in BP. Advised to change to NTG with goal SBP as 160,  IV diuresis, monitor BMP, restart home medications, counseled about intercourse  Acute on chronic combined systolic and diastolic heart failure, NYHA class IV with acute pulmonary edema, warm and wet  - known LVEF of 20% post CABG - checking BNP, will obtain echo to evaluate improvement in LVEF, continue lisinopril and coreg, aspirin and statin  - consider adding spironolactone once creatinine has stabilized - avoid rapid correction of BP and keep it >160   Acute hypoxic respiratory failure from pulmonary edema - bipap   Coronary artery disease of native vessels - continue aspirin, beta blocker, statin and lisinopril   Acute kidney injury secondary to cardiorenal syndrome - IV diuresis, monitor BMP bid  Leukocytosis, likely demargination, will monitor CBC neutos 60% only  Atrial fibrillation with RVR, chads2vasc score = 3, hasbled =1 s/p LAA appendage clipping -  restarted on IV heparin and coreg - spontaneously converted to sinus rhythm - discuss plan for long term A/C with the patient and the family  - Keep potassium >4, calcium >9, magnesium >2    Signed, Flossie Dibble, MD MS 04/02/2016, 4:07 AM

## 2016-04-02 NOTE — Progress Notes (Signed)
ANTICOAGULATION CONSULT NOTE - Follow Up Consult  Pharmacy Consult for heparin Indication: atrial fibrillation  Allergies  Allergen Reactions  . No Known Allergies Other (See Comments)    Patient Measurements: Height: 6' (182.9 cm) Weight: 225 lb 12 oz (102.4 kg) IBW/kg (Calculated) : 77.6 Heparin Dosing Weight: 100 kg  Vital Signs: Temp: 98.3 F (36.8 C) (10/01 2005) Temp Source: Oral (10/01 2005) BP: 133/79 (10/01 1700) Pulse Rate: 89 (10/01 1700)  Labs:  Recent Labs  04/02/16 0301 04/02/16 0615 04/02/16 0932 04/02/16 1125 04/02/16 1826 04/02/16 1945  HGB 17.2* 16.3  --   --   --   --   HCT 52.5* 48.7  --   --   --   --   PLT 277 222  --   --   --   --   LABPROT  --  13.6  --   --   --   --   INR  --  1.03  --   --   --   --   HEPARINUNFRC  --   --   --  0.16*  --  0.34  CREATININE 1.33* 1.38* 1.31*  --  1.31*  --   TROPONINI  --  0.06*  --  0.05* 0.06*  --     Estimated Creatinine Clearance: 80.7 mL/min (by C-G formula based on SCr of 1.31 mg/dL (H)).  Assessment: 55 y/o M s/p CABG in 12/2015 w/ CHF and afib. Pharmacy consulted to dose heparin. Heparin level is therapeutic after most recent dose increase to 1500 units/hr.  Goal of Therapy:  Heparin level 0.3-0.7 units/ml Monitor platelets by anticoagulation protocol: Yes   Plan:  1. Continue heparin infusion at 1500 units/hr 2. Heparin level in am to serve as confirmatory  3. Daily heparin level, CBC  Vincenza Hews, PharmD, BCPS 04/02/2016, 9:07 PM Pager: [phone]

## 2016-04-02 NOTE — Progress Notes (Signed)
Responded to page when in [place]. Patient's family includes father who is Clinical biochemist at [employer]. Provided emotional support to several family members through long wait for room. Chaplain available for follow-up.   04/02/16 0800  Clinical Encounter Type  Visited With Patient and family together  Visit Type Initial  Referral From Nurse  Spiritual Encounters  Spiritual Needs Emotional  Stress Factors  Patient Stress Factors Health changes  Family Stress Factors Health changes

## 2016-04-02 NOTE — ED Triage Notes (Signed)
Pt transported from home by EMS [facility]) pt c/o shob onset 2300, EMS arrived to find patient mottled, sat 62% on RA, up to 88 on NRB. Pt was actively vomiting on EMS arrival. Pt alert, speaking short sentences.

## 2016-04-02 NOTE — Progress Notes (Signed)
Patient placed on BIPAP per MD order. Patient placed on 16/8 and 100%. Patient is tolerating well at this time.

## 2016-04-02 NOTE — Progress Notes (Signed)
    Primary cardiologist: Dr. Kate Sable  Cardiology fellow history and physical reviewed from earlier today. Patient presented with acute pulmonary edema in the setting of hypertensive urgency and transient rapid atrial fibrillation. Attempted intercourse with wife earlier yesterday evening may have been associated. Otherwise he reports stable dyspnea on exertion, worse going up steps, does have some leg edema at the end of the day. He has not been using Lasix with any regularity, reportedly stable weight.  He appears comfortable this morning on rounds, heart rate in the 80s and sinus rhythm. Lungs exhibit fairly diffuse crackles, cardiac exam with RRR and no gallop, soft systolic murmur. Chest x-ray shows pulmonary vascular congestion with diffuse edema. Troponin I level 0.06 and ECG showing only nonspecific ST segment changes.  He has multivessel CAD status post CABG and left atrial appendage clipping in June of this year. LVEF previously documented at approximately 25%. He had experienced paroxysmal atrial fibrillation in the perioperative setting, no longer on anticoagulation now.  Current medications include aspirin, Coreg, Lipitor, lisinopril, and IV Lasix.  At this point would continue IV diuresis and follow-up echocardiogram to reassess LVEF, degree of mitral regurgitation (mild to moderate previously), and also PASP. Do not necessarily anticipate further invasive testing unless he does not improve clinically and needs assessment of right heart pressures. With recurring PAF need to reconsider longer-term use of anticoagulant. He is currently on heparin. Expect that he will need to be on a standing diuretic at home particularly if cardiomyopathy persists.  Scheduled with patient and wife.  Satira Sark, M.D., F.A.C.C.

## 2016-04-02 NOTE — ED Notes (Signed)
Pt sitting on the edge of the bed at this time, NAD, pt remains on ND at 4L

## 2016-04-02 NOTE — Progress Notes (Signed)
ANTICOAGULATION CONSULT NOTE - Initial Consult  Pharmacy Consult for Heparin Indication: atrial fibrillation  Allergies  Allergen Reactions  . No Known Allergies Other (See Comments)    Patient Measurements: Height: 6' (182.9 cm) Weight: 226 lb (102.5 kg) IBW/kg (Calculated) : 77.6 Heparin Dosing Weight: 100 kg  Vital Signs: BP: 93/64 (10/01 0530) Pulse Rate: 61 (10/01 0530)  Labs:  Recent Labs  04/02/16 0301  HGB 17.2*  HCT 52.5*  PLT 277  CREATININE 1.33*    Estimated Creatinine Clearance: 79.6 mL/min (by C-G formula based on SCr of 1.33 mg/dL (H)).   Medical History: Past Medical History:  Diagnosis Date  . Acute on chronic combined systolic and diastolic CHF, NYHA class 4 ([place]) 04/02/2016  . Acute systolic congestive heart failure ([place])   . Atrial fibrillation with rapid ventricular response ([place]) 12/13/2015  . Cardiomyopathy ([place]) 12/13/2015  . Cardiomyopathy, ischemic 12/14/2015   EF 20-25%  . Coronary artery disease involving native coronary artery with unstable angina pectoris ([place]) 12/15/2015  . GERD (gastroesophageal reflux disease)    pt. denies  . Hypercholesterolemia 12/15/2015  . Mitral regurgitation 12/14/2015   moderate  . NSTEMI (non-ST elevated myocardial infarction) ([place])   . PONV (postoperative nausea and vomiting)    "sick with very first surgery but nothing since"  . 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 Tobacco abuse     Medications:  No current facility-administered medications on file prior to encounter.    Current Outpatient Prescriptions on File Prior to Encounter  Medication Sig Dispense Refill  . aspirin 81 MG EC tablet Take 1 tablet (81 mg total) by mouth daily. 30 tablet   . atorvastatin (LIPITOR) 80 MG tablet Take 1 tablet (80 mg total) by mouth daily at 6 PM. 30 tablet 3  . carvedilol (COREG) 6.25 MG tablet Take 1 tablet  (6.25 mg total) by mouth 2 (two) times daily with a meal. 60 tablet 3  . furosemide (LASIX) 40 MG tablet Take 1 tablet (40 mg total) by mouth as needed. (Patient taking differently: Take 40 mg by mouth daily as needed for fluid. ) 90 tablet 3  . lisinopril (PRINIVIL,ZESTRIL) 2.5 MG tablet Take 1 tablet (2.5 mg total) by mouth daily. 30 tablet 3  . potassium chloride SA (K-DUR,KLOR-CON) 20 MEQ tablet Take 1 tablet (20 mEq total) by mouth daily. As needed (Patient taking differently: Take 20 mEq by mouth daily as needed (when taking lasix). As needed) 30 tablet 0     Assessment: 55 y.o. male s/p CABG 12/2015, with CHF and Afib, for heparin Goal of Therapy:  Heparin level 0.3-0.7 units/ml Monitor platelets by anticoagulation protocol: Yes   Plan:  Heparin 4000 units IV bolus, then start heparin 1200 units/hr Check heparin level in 6 hours.   Kayley Zeiders, Bronson Curb 04/02/2016,5:44 AM

## 2016-04-02 NOTE — ED Notes (Signed)
Dr Jeneen Rinks aware of BP, RT to change BiBAP to 12/6

## 2016-04-02 NOTE — ED Notes (Signed)
Notified Floor to call [phone] for report

## 2016-04-02 NOTE — ED Notes (Signed)
Pt requesting a break from BIPAP. Card at bedside. Pt quickly desats to 66%. Cardene stopped d/t pressure. Pt placed back on BIPAP by RT

## 2016-04-02 NOTE — ED Provider Notes (Addendum)
[facility] DEPT Provider Note   CSN: [id number] Arrival date & time: 04/02/16  0247  By signing my name below, I, Maud Deed. Royston Sinner, attest that this documentation has been prepared under the direction and in the presence of Tanna Furry, MD.  Electronically Signed: Maud Deed. Royston Sinner, ED Scribe. 04/02/16. 3:18 AM.    History   Chief Complaint Chief Complaint  Patient presents with  . Respiratory Distress   The history is provided by the patient. No language interpreter was used.    HPI Comments: [name] [name] is a 55 y.o. male with a PMHx of systolic congestive heart failure, A-Fib, cardiomyopathy, CAD, GERD who presents to the Emergency Department complaining of sudden onset, constant, unchanged shortness of breath onset 11:00 PM this evening. Initial Oxygen at time of EMS arrival, 62%. 2 Nitro and 10 of Morphine given en route to department. Pt denies any chest pain at this time. PSHx includes CABG x 3 with clipping of LA appendage 12/17/15.  PCP: Mickie Hillier, MD    Past Medical History:  Diagnosis Date  . Acute systolic congestive heart failure ([place])   . Atrial fibrillation with rapid ventricular response ([place]) 12/13/2015  . Cardiomyopathy ([place]) 12/13/2015  . Cardiomyopathy, ischemic 12/14/2015   EF 20-25%  . Coronary artery disease involving native coronary artery with unstable angina pectoris ([place]) 12/15/2015  . GERD (gastroesophageal reflux disease)    pt. denies  . Hypercholesterolemia 12/15/2015  . Mitral regurgitation 12/14/2015   moderate  . NSTEMI (non-ST elevated myocardial infarction) ([place])   . PONV (postoperative nausea and vomiting)    "sick with very first surgery but nothing since"  . 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 Tobacco abuse     Patient Active Problem List   Diagnosis Date Noted  . Carotid stenosis, asymptomatic 02/09/2016  . Encounter for  therapeutic drug monitoring 12/23/2015  . 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 Acute systolic congestive heart failure ([place])   . Hypercholesterolemia 12/15/2015  . Elevated troponin 12/15/2015  . Coronary artery disease involving native coronary artery with unstable angina pectoris ([place]) 12/15/2015  . NSTEMI (non-ST elevated myocardial infarction) ([place])   . Cardiomyopathy, ischemic 12/14/2015  . Mitral regurgitation 12/14/2015  . Atrial fibrillation with RVR ([place]) 12/13/2015  . Atrial fibrillation with rapid ventricular response ([place]) 12/13/2015  . Cardiomyopathy ([place]) 12/13/2015  . Leukocytosis 12/13/2015  . Tobacco abuse     Past Surgical History:  Procedure Laterality Date  . CARDIAC CATHETERIZATION N/A 12/15/2015   Procedure: Right/Left Heart Cath and Coronary Angiography;  Surgeon: Wellington Hampshire, MD;  Location: [facility];  Service: Cardiovascular;  Laterality: N/A;  . CLIPPING OF ATRIAL APPENDAGE Left 12/17/2015   Procedure: CLIPPING OF ATRIAL APPENDAGE;  Surgeon: [name], MD;  Location: [facility];  Service: Open Heart Surgery;  Laterality: Left;  . CORONARY ARTERY BYPASS GRAFT N/A 12/17/2015   Procedure: CORONARY ARTERY BYPASS GRAFTING (CABG) x three , using left internal mammary artery and right leg greater saphenous vein;  Surgeon: [name], MD;  Location: [facility];  Service: Open Heart Surgery;  Laterality: N/A;  . ENDARTERECTOMY Left 02/09/2016   Procedure: LEFT CAROTID ENDARTERECTOMY;  Surgeon: Serafina Mitchell, MD;  Location: [facility];  Service: Vascular;  Laterality: Left;  . KNEE ARTHROSCOPY Right 1991  .  PATCH ANGIOPLASTY Left 02/09/2016   Procedure: PATCH ANGIOPLASTY USING Rueben Bash BIOLOGIC PATCH;  Surgeon: Serafina Mitchell, MD;  Location[facility];  Service: Vascular;  Laterality: Left;       Home Medications    Prior to Admission medications   Medication Sig Start Date End Date Taking? Authorizing Provider  aspirin 81 MG EC tablet Take  1 tablet (81 mg total) by mouth daily. 01/17/16   [name], MD  atorvastatin (LIPITOR) 80 MG tablet Take 1 tablet (80 mg total) by mouth daily at 6 PM. 01/17/16   [name], MD  carvedilol (COREG) 6.25 MG tablet Take 1 tablet (6.25 mg total) by mouth 2 (two) times daily with a meal. 01/17/16   [name], MD  furosemide (LASIX) 40 MG tablet Take 1 tablet (40 mg total) by mouth as needed. Patient not taking: Reported on 02/21/2016 01/06/16   Lendon Colonel, NP  lisinopril (PRINIVIL,ZESTRIL) 2.5 MG tablet Take 1 tablet (2.5 mg total) by mouth daily. 01/17/16   [name], MD  potassium chloride SA (K-DUR,KLOR-CON) 20 MEQ tablet Take 1 tablet (20 mEq total) by mouth daily. As needed Patient not taking: Reported on 02/21/2016 02/10/16   Alvia Grove, PA-C    Family History Family History  Problem Relation Age of Onset  . Heart attack Paternal Grandfather   . Heart failure Father   . Atrial fibrillation Father     Social History Social History  Substance Use Topics  . Smoking status: Former Smoker    Packs/day: 1.00    Types: Cigarettes    Quit date: 12/13/2015  . Smokeless tobacco: Former Systems developer    Quit date: 12/13/2015  . Alcohol use 0.0 oz/week     Comment: rare     Allergies   No known allergies   Review of Systems Review of Systems  Constitutional: Negative for chills and fever.  Respiratory: Positive for shortness of breath.   Cardiovascular: Negative for chest pain.  Gastrointestinal: Negative for nausea and vomiting.  All other systems reviewed and are negative.    Physical Exam Updated Vital Signs BP 135/77   Pulse 100   Resp (!) 36   Ht 6' (1.829 m)   Wt 226 lb (102.5 kg)   SpO2 (!) 65%   BMI 30.65 kg/m   Physical Exam  Constitutional: He is oriented to person, place, and time. He appears well-developed and well-nourished. He appears distressed.  Distressed two word dyspnea. Diaphoretic.  HENT:  Head: Normocephalic.  Eyes:  Conjunctivae are normal. Pupils are equal, round, and reactive to light. No scleral icterus.  Neck: Normal range of motion. Neck supple. No thyromegaly present.  Cardiovascular: Normal rate and regular rhythm.  Exam reveals no gallop and no friction rub.   No murmur heard. Pulmonary/Chest: No respiratory distress. He has no wheezes. He has no rales.  Diffuse crackles from the lung bases to the scapula bilaterally. Increased worker breathing. Tachypnea.  Abdominal: Soft. Bowel sounds are normal. He exhibits no distension. There is no tenderness. There is no rebound.  Musculoskeletal: Normal range of motion.  Neurological: He is alert and oriented to person, place, and time.  Skin: Skin is warm. No rash noted. He is diaphoretic.  1+ symmetric lower extremity edema.  Psychiatric: He has a normal mood and affect. His behavior is normal.     ED Treatments / Results   DIAGNOSTIC STUDIES: Oxygen Saturation is 94% on BiPAP, adequate by my interpretation.  COORDINATION OF CARE: 3:18 AM- Will give Lasix, Cardene, Nitro, and Zofran. Will order blood work or EKG. Discussed treatment plan with pt at bedside and pt agreed to plan.     Labs (all labs ordered are listed, but only abnormal results are displayed) Labs Reviewed  CBC WITH DIFFERENTIAL/PLATELET - Abnormal; Notable for the following:       Result Value   WBC 16.4 (*)    Hemoglobin 17.2 (*)    HCT 52.5 (*)    Neutro Abs 9.6 (*)    Lymphs Abs 5.2 (*)    Monocytes Absolute 1.2 (*)    All other components within normal limits  BASIC METABOLIC PANEL - Abnormal; Notable for the following:    CO2 20 (*)    Glucose, Bld 256 (*)    Creatinine, Ser 1.33 (*)    Calcium 8.5 (*)    GFR calc non Af Amer 60 (*)    All other components within normal limits  BRAIN NATRIURETIC PEPTIDE - Abnormal; Notable for the following:    B Natriuretic Peptide 325.4 (*)    All other components within normal limits  I-STAT TROPOININ, ED    EKG  EKG  Interpretation None       Radiology Dg Chest Port 1 View  Result Date: 04/02/2016 CLINICAL DATA:  Congestive heart failure.  Shortness of breath. EXAM: PORTABLE CHEST 1 VIEW COMPARISON:  01/14/2016 FINDINGS: Postoperative changes in the mediastinum. Interval development of cardiac enlargement with pulmonary vascular congestion. Diffuse airspace and interstitial infiltrates in the lungs consistent with edema. No pneumothorax. No definite pleural effusions. IMPRESSION: Interval development of congestive changes with cardiac enlargement, pulmonary vascular congestion, and diffuse pulmonary edema. Electronically Signed   By: Lucienne Capers M.D.   On: 04/02/2016 03:14    Procedures Procedures (including critical care time)  Medications Ordered in ED Medications  nitroGLYCERIN 0.2 mg/mL in dextrose 5 % infusion (not administered)  furosemide (LASIX) injection 80 mg (80 mg Intravenous Given 04/02/16 0259)  nicardipine (CARDENE) 20mg  in 0.86% saline 249ml IV infusion (0.1 mg/ml) (0 mg/hr Intravenous Stopped 04/02/16 0325)  ondansetron (ZOFRAN) injection 4 mg (4 mg Intravenous Given 04/02/16 0258)  ondansetron (ZOFRAN) 4 MG/2ML injection (4 mg  Given 04/02/16 0307)     Initial Impression / Assessment and Plan / ED Course  I have reviewed the triage vital signs and the nursing notes.  Pertinent labs & imaging results that were available during my care of the patient were reviewed by me and considered in my medical decision making (see chart for details).  Clinical Course    Patient also states that he and his wife were having intercourse when the symptoms started. The check blood pressure 215/140 and he became progressively short of breath "blue". He denies taking Viagra or other ED medications.   He is placed on a Cardene drip. His blood pressures quickly improved and had to be titrated downward. Lasix 80 mg IV. Placed on BiPAP 10/5. Exam, and chest x-ray suggests an show pulmonary  edema.  Likely flash pulmonary edema secondary to hypertension secondary to physical activity/intercourse. Attempted to wean from BiPAP and not tolerating, became hypoxemic. Placed back on BiPAP. Pressures off of Cardene 130/76. Will admit to cardiology. Stepdown bed requested.  Final Clinical Impressions(s) / ED Diagnoses   Final diagnoses:  Acute on chronic systolic congestive heart failure ([place])    CRITICAL CARE Performed by: Tanna Furry JOSEPH   Total critical care time: 45 minutes  Critical care time was  exclusive of separately billable procedures and treating other patients.  Critical care was necessary to treat or prevent imminent or life-threatening deterioration.  Critical care was time spent personally by me on the following activities: development of treatment plan with patient and/or surrogate as well as nursing, discussions with consultants, evaluation of patient's response to treatment, examination of patient, obtaining history from patient or surrogate, ordering and performing treatments and interventions, ordering and review of laboratory studies, ordering and review of radiographic studies, pulse oximetry and re-evaluation of patient's condition.   New Prescriptions New Prescriptions   No medications on file   I personally performed the services described in this documentation, which was scribed in my presence. The recorded information has been reviewed and is accurate.    Tanna Furry, MD 04/02/16 [place], MD 04/02/16 [phone]

## 2016-04-02 NOTE — ED Notes (Signed)
Pt appears much more comfortable, no mottling can be seen at this time. Family at bedside

## 2016-04-03 ENCOUNTER — Ambulatory Visit: Payer: 59 | Admitting: Family Medicine

## 2016-04-03 ENCOUNTER — Inpatient Hospital Stay (HOSPITAL_COMMUNITY): Payer: 59

## 2016-04-03 ENCOUNTER — Other Ambulatory Visit: Payer: Self-pay | Admitting: Cardiology

## 2016-04-03 ENCOUNTER — Other Ambulatory Visit: Payer: Self-pay

## 2016-04-03 ENCOUNTER — Ambulatory Visit (INDEPENDENT_AMBULATORY_CARE_PROVIDER_SITE_OTHER): Payer: 59

## 2016-04-03 DIAGNOSIS — I4891 Unspecified atrial fibrillation: Secondary | ICD-10-CM | POA: Diagnosis not present

## 2016-04-03 DIAGNOSIS — R079 Chest pain, unspecified: Secondary | ICD-10-CM

## 2016-04-03 LAB — ECHOCARDIOGRAM COMPLETE
HEIGHTINCHES: 72 in
WEIGHTICAEL: 3633.18 [oz_av]

## 2016-04-03 LAB — CBC
HEMATOCRIT: 45.2 % (ref 39.0–52.0)
HEMOGLOBIN: 15.2 g/dL (ref 13.0–17.0)
MCH: 29.9 pg (ref 26.0–34.0)
MCHC: 33.6 g/dL (ref 30.0–36.0)
MCV: 88.8 fL (ref 78.0–100.0)
Platelets: 212 10*3/uL (ref 150–400)
RBC: 5.09 MIL/uL (ref 4.22–5.81)
RDW: 15.5 % (ref 11.5–15.5)
WBC: 12.2 10*3/uL — ABNORMAL HIGH (ref 4.0–10.5)

## 2016-04-03 LAB — BASIC METABOLIC PANEL
ANION GAP: 9 (ref 5–15)
BUN: 18 mg/dL (ref 6–20)
CHLORIDE: 99 mmol/L — AB (ref 101–111)
CO2: 28 mmol/L (ref 22–32)
Calcium: 9.2 mg/dL (ref 8.9–10.3)
Creatinine, Ser: 1.21 mg/dL (ref 0.61–1.24)
GFR calc Af Amer: >60 mL/min (ref >60)
GLUCOSE: 116 mg/dL — AB (ref 65–99)
POTASSIUM: 3.5 mmol/L (ref 3.5–5.1)
Sodium: 136 mmol/L (ref 135–145)

## 2016-04-03 LAB — HEPARIN LEVEL (UNFRACTIONATED): HEPARIN UNFRACTIONATED: 0.58 [IU]/mL (ref 0.30–0.70)

## 2016-04-03 LAB — HEMOGLOBIN A1C
Hgb A1c MFr Bld: 5.8 % — ABNORMAL HIGH (ref 4.8–5.6)
Mean Plasma Glucose: 120 mg/dL

## 2016-04-03 MED ORDER — FUROSEMIDE 40 MG PO TABS: 20.0000 mg | ORAL_TABLET | Freq: Every day | ORAL | 3 refills | Status: DC

## 2016-04-03 MED ORDER — SPIRONOLACTONE 25 MG PO TABS
12.5000 mg | ORAL_TABLET | Freq: Every day | ORAL | Status: DC
  Administered 2016-04-03: 12.5 mg via ORAL
  Filled 2016-04-03: qty 1

## 2016-04-03 MED ORDER — FUROSEMIDE 20 MG PO TABS
20.0000 mg | ORAL_TABLET | Freq: Every day | ORAL | Status: DC
  Administered 2016-04-03: 20 mg via ORAL
  Filled 2016-04-03: qty 1

## 2016-04-03 MED ORDER — SPIRONOLACTONE 25 MG PO TABS: 12.5000 mg | ORAL_TABLET | Freq: Every day | ORAL | 3 refills | Status: DC

## 2016-04-03 NOTE — Progress Notes (Signed)
D/c education give to patient and wife. Verbalized understanding. RN called Dr. Marlou Porch regarding cardiac event monitor order for pt to pick up. Pt wants to wait for order to  be placed for [name] [name] to provide patient with monitor before d/c.

## 2016-04-03 NOTE — Discharge Summary (Signed)
Patient ID: [name] [name],  MRN: [id number], DOB/AGE: [birth year] 55 y.o.  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 Systolic   function was moderately to severely reduced. The estimated   ejection fraction was in the range of 30% to 35%. Diffuse   hypokinesis. Features are consistent with a pseudonormal left   ventricular filling pattern, with concomitant abnormal relaxation   and increased filling pressure (grade 2 diastolic dysfunction).   Doppler parameters are consistent with high ventricular filling   pressure. - Aortic valve: Transvalvular velocity was within the normal range.   There was no stenosis. There was trivial regurgitation. - Mitral valve: Transvalvular velocity was within the normal range.   There was no evidence for stenosis. There was mild regurgitation. - Left atrium: The atrium was severely dilated. - Right ventricle: The cavity size was normal. Wall thickness was   normal. Systolic function was normal. - Atrial septum: No defect or patent foramen ovale was identified   by color flow Doppler. - Tricuspid valve: There was no regurgitation. - Pulmonary arteries: Systolic pressure was within the normal   range. PA peak pressure: 20 mm Hg (S).   Hospital Course:  55 y.o. male with 3 v disease s/p CABG x 04 December 2015. He is followed by Dr  Bronson Ing in [place]. Other problems include HTN, ischemic CMP LVEF 20%, mild MR, HLD, and past tobacco abuse. The pt was transported from home by EMS [facility]) pt c/o shob onset 2300 04/02/16 with suddent onset of SOB. He reports he was in his usual state of health that evening. He and his wife were trying to have intercourse when he suddenly developed SOB around 11 PM. 911 was called. His initial EKG was concerning for possible anterior STEMI however it was in the setting of (?) PAF with RVR (never clearly documented and long term anticoagulation not recommended at this time).The patient was cyanotic on admission and was placed on bipap with improvement in hypoxia. He was hypertensive initially and this was treated with IV Cardene. He diuresed 2.3L on IV Lasix. Echo done- resulte noted above. Dr Marlou Porch feels he can discharged 04/03/16 with plans for an OP 30 day event monitor to evaluate for PAF. He also would like a follow up appointment with the CHF clinic to be arranged. An APP will see the pt in 1-2 weeks in [place].   Discharge Vitals:  Blood pressure 140/90, pulse 60, temperature 98.5 F (36.9 C), temperature source Oral, resp. rate 16, height 6' (1.829 m), weight 227 lb 1.2 oz (103 kg), SpO2 98 %.    Labs: Results for orders placed or performed during the hospital encounter of 04/02/16 (from the past 24 hour(s))  Troponin I     Status: Abnormal   Collection Time: 04/02/16  6:26 PM  Result Value Ref Range   Troponin I 0.06 (HH) <0.03 ng/mL  Basic metabolic panel     Status: Abnormal   Collection Time: 04/02/16  6:26 PM  Result Value Ref Range   Sodium 140 135 - 145 mmol/L   Potassium 3.9 3.5 - 5.1 mmol/L   Chloride 103 101 - 111 mmol/L   CO2 27 22 - 32 mmol/L   Glucose, Bld 99 65 - 99 mg/dL   BUN 19 6 - 20 mg/dL   Creatinine, Ser 1.31 (H) 0.61 - 1.24 mg/dL   Calcium 8.9 8.9 - 10.3 mg/dL   GFR calc non Af Amer >60 >60 mL/min   GFR calc Af Amer >60 >60 mL/min   Anion gap 10 5  - 15  Heparin level (unfractionated)     Status: None   Collection Time: 04/02/16  7:45 PM  Result Value Ref Range   Heparin Unfractionated 0.34 0.30 - 0.70 IU/mL  Heparin level (unfractionated)     Status: None   Collection Time: 04/03/16  8:12 AM  Result Value Ref Range   Heparin Unfractionated 0.58 0.30 - 0.70 IU/mL  Basic metabolic panel     Status: Abnormal   Collection Time: 04/03/16  8:12 AM  Result Value Ref Range   Sodium 136 135 - 145 mmol/L   Potassium 3.5 3.5 - 5.1 mmol/L   Chloride 99 (L) 101 - 111 mmol/L   CO2 28 22 - 32 mmol/L   Glucose, Bld 116 (H) 65 - 99 mg/dL   BUN 18 6 - 20 mg/dL   Creatinine, Ser 1.21 0.61 - 1.24 mg/dL   Calcium 9.2 8.9 - 10.3 mg/dL   GFR calc non Af Amer >60 >60 mL/min   GFR calc Af Amer >60 >60 mL/min   Anion gap 9 5 - 15  CBC     Status: Abnormal   Collection Time: 04/03/16  8:12 AM  Result Value Ref Range   WBC 12.2 (H) 4.0 - 10.5 K/uL   RBC 5.09 4.22 - 5.81 MIL/uL   Hemoglobin 15.2 13.0 - 17.0 g/dL   HCT 45.2 39.0 - 52.0 %   MCV 88.8 78.0 - 100.0 fL   MCH 29.9 26.0 - 34.0 pg   MCHC 33.6 30.0 - 36.0 g/dL   RDW 15.5 11.5 - 15.5 %   Platelets 212 150 - 400 K/uL    Disposition:  Follow-up Information    Kate Sable, MD .   Specialty:  Cardiology Why:  Office will contact you Contact information: [place] [address] [place] .   Specialty:  Cardiology Why:  Office will contact you for an appointment Contact information: [id number] [place] [phone]          Discharge Medications:    Medication List    STOP taking these medications   potassium chloride SA 20 MEQ tablet Commonly known as:  K-DUR,KLOR-CON     TAKE these medications   aspirin 81 MG EC tablet Take 1 tablet (81 mg total) by mouth daily.   atorvastatin 80 MG tablet Commonly known as:  LIPITOR Take 1  tablet (80 mg total) by mouth daily at 6 PM.   carvedilol 6.25 MG tablet Commonly known as:  COREG Take 1 tablet (6.25 mg total) by mouth 2 (two) times daily with a meal.   furosemide 40 MG  tablet Commonly known as:  LASIX Take 0.5 tablets (20 mg total) by mouth daily. What changed:  how much to take  when to take this  reasons to take this   lisinopril 2.5 MG tablet Commonly known as:  PRINIVIL,ZESTRIL Take 1 tablet (2.5 mg total) by mouth daily.   spironolactone 25 MG tablet Commonly known as:  ALDACTONE Take 0.5 tablets (12.5 mg total) by mouth daily. Start taking on:  04/04/2016        Duration of Discharge Encounter: Greater than 30 minutes including physician time.  Signed, Kerin Ransom PA-C 04/03/2016 1:25 PM   Personally seen and examined. Agree with above.   Patient Name: [name] [name] Date of Encounter: 04/03/2016  Primary Cardiologist: Dr. 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 Currently normal sinus rhythm. I do not see any telemetry evidence of atrial fibrillation. This was documented in the initial history and physical as possible atrial fibrillation with rapid ventricular MAT.  Spoke with he and his wife at length.    Inpatient Medications    Scheduled Meds: . aspirin EC  81 mg Oral Daily  . atorvastatin  80 mg Oral q1800  . carvedilol  6.25 mg Oral BID WC  . Influenza vac split  quadrivalent PF  0.5 mL Intramuscular Tomorrow-1000  . lisinopril  2.5 mg Oral Daily  . sodium chloride flush  3 mL Intravenous Q12H   Continuous Infusions: . heparin 1,500 Units/hr (04/02/16 2048)  . nitroGLYCERIN     PRN Meds:.   Vital Signs          Vitals:   04/03/16 0019 04/03/16 0400 04/03/16 0413 04/03/16 0500  BP: 115/68  127/74   Pulse: 64  80   Resp: 20  (!) 21   Temp:  98.5 F (36.9 C)    TempSrc:  Oral    SpO2: 95%  97%   Weight:    227 lb 1.2 oz (103 kg)  Height:        Intake/Output Summary (Last 24 hours) at 04/03/16 0801 Last data filed at 04/03/16 0500  Gross per 24 hour  Intake           556.65 ml  Output             2900 ml  Net         -2343.35 ml        Filed Weights   04/02/16 0257 04/02/16 0815 04/03/16 0500  Weight: 226 lb (102.5 kg) 225 lb 12 oz (102.4 kg) 227 lb 1.2 oz (103 kg)    Physical Exam    GEN: Well nourished, well developed, in no acute distress.  HEENT: Grossly normal.  Neck: Supple, no JVD, carotid bruits, or masses. Cardiac: RRR, no murmurs, rubs, or gallops. No clubbing, cyanosis, edema.  Radials/DP/PT 2+ and equal bilaterally.  Respiratory:  Respirations regular and unlabored, clear to auscultation bilaterally. GI: Soft, nontender,  nondistended, BS + x 4. MS: no deformity or atrophy. Skin: warm and dry, no rash. Neuro:  Strength and sensation are intact. Psych: AAOx3.  Normal affect.  Labs    CBC  Recent Labs (last 2 labs)    Recent Labs  04/02/16 0301 04/02/16 0615  WBC 16.4* 19.6*  NEUTROABS 9.6*  --   HGB 17.2* 16.3  HCT 52.5* 48.7  MCV 91.8 91.5  PLT 277 222     Basic Metabolic Panel  Recent Labs (last 2 labs)    Recent Labs  04/02/16 0932 04/02/16 1826  NA 141 140  K 4.6 3.9  CL 105 103  CO2 29 27  GLUCOSE 135* 99  BUN 17 19  CREATININE 1.31* 1.31*  CALCIUM 8.7* 8.9     Liver Function Tests  Recent Labs (last 2 labs)    Recent Labs   04/02/16 0615  AST 23  ALT 21  ALKPHOS 128*  BILITOT 1.1  PROT 6.8  ALBUMIN 3.6     Recent Labs (last 2 labs)   No results for input(s): LIPASE, AMYLASE in the last 72 hours.   Cardiac Enzymes  Recent Labs (last 2 labs)    Recent Labs  04/02/16 0615 04/02/16 1125 04/02/16 1826  TROPONINI 0.06* 0.05* 0.06*     BNP Recent Labs (last 2 labs)   Invalid input(s): POCBNP   D-Dimer Recent Labs (last 2 labs)   No results for input(s): DDIMER in the last 72 hours.   Hemoglobin A1C Recent Labs (last 2 labs)   No results for input(s): HGBA1C in the last 72 hours.   Fasting Lipid Panel Recent Labs (last 2 labs)   No results for input(s): CHOL, HDL, LDLCALC, TRIG, CHOLHDL, LDLDIRECT in the last 72 hours.   Thyroid Function Tests  Recent Labs (last 2 labs)    Recent Labs  04/02/16 0650  TSH 5.527*      Telemetry    NSR - Personally Reviewed  ECG    Sinus tachy 110, non other changes - Personally Reviewed  Radiology     Imaging Results (Last 48 hours)  Dg Chest Port 1 View  Result Date: 04/02/2016 CLINICAL DATA:  Congestive heart failure.  Shortness of breath. EXAM: PORTABLE CHEST 1 VIEW COMPARISON:  01/14/2016 FINDINGS: Postoperative changes in the mediastinum. Interval development of cardiac enlargement with pulmonary vascular congestion. Diffuse airspace and interstitial infiltrates in the lungs consistent with edema. No pneumothorax. No definite pleural effusions. IMPRESSION: Interval development of congestive changes with cardiac enlargement, pulmonary vascular congestion, and diffuse pulmonary edema. Electronically Signed   By: Lucienne Capers M.D.   On: 04/02/2016 03:14      Cardiac Studies   Prior EF 20-25% prior to CABG 12/17/15, left atrial appendage clipping admitted with what sounds like sudden onset shortness of breath compatible with acute systolic heart failure/hypertensive emergency ? Placed on the cart of pain in emergency room  in the setting of potential "atrial fibrillation "as noted by history and physical.  Patient Profile       Assessment & Plan    55 year old status post CABG with what sounds like acute systolic heart failure, possible atrial arrhythmia.  Acute systolic heart failure  - Lasix IV was administered  - Currently comfortable, 2.3 L out.  - He may have had flash pulmonary edema  - Continue with Lasix however daily dose would be adequate.  - We'll place on Lasix 20 mg once a day.  - If EF remains low, 20-25%,  may wish to add digoxin 125.  - Posthospitalization, it would be nice for him to have a visit with advanced heart failure clinic.  - Could consider Entresto.  - Echocardiogram pending  Possible atrial fibrillation?  - I reviewed EMS strips. They do not appear to be atrial fibrillation.  - Unsure if this was accurate. This was documented however in the history and physical.  - If atrial fibrillation is present, he will need full anticoagulation.  - Since I do not see any evidence of atrial fibrillation currently, we will stop his heparin IV.  - Continue with carvedilol 6.25 mg twice a day.  - On low-dose lisinopril.    Questionable hypertensive urgency  - Blood pressure was 215/140 at home when he became progressively shortness of breath.  - Brief use of Cardene drip. BiPAP.   It is possible that he had flash pulmonary edema/acute systolic heart failure in the setting of either hypertensive emergency in anticipation of intercourse versus atrial arrhythmia which triggered symptomatology.  We will go ahead and place on daily Lasix 20 mg once a day. I will also add spironolactone 12.5 mg once a day.  He appears clinically stable at this point. No crackles. Compensated. After echocardiogram, if feeling comfortable, I think discharge later today would be reasonable. At home he has been daily weighing, able to walk 6 miles a day without difficulty recently.  Will place event  monitor as outpatient. 30 day. Can pick up in [place].  Dr. Haroldine Laws saw as well. He will see after 30 day event monitor results in AHF clinic.  Has follow up with Dr. Raliegh Ip in December.   ADDEN: EF 30-35%. Opens up continued ICD discussion.  Discussed with patient and family.   Signed, Candee Furbish, MD  04/03/2016, 8:01 AM

## 2016-04-03 NOTE — Progress Notes (Signed)
  Echocardiogram 2D Echocardiogram has been performed.  [name] [name] 04/03/2016, 9:35 AM

## 2016-04-03 NOTE — Progress Notes (Addendum)
Patient Name: [name] [name] Date of Encounter: 04/03/2016  Primary Cardiologist: Dr. 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 Currently normal sinus rhythm. I do not see any telemetry evidence of atrial fibrillation. This was documented in the initial history and physical as possible atrial fibrillation with rapid ventricular MAT.  Spoke with he and his wife at length.    Inpatient Medications    Scheduled Meds: . aspirin EC  81 mg Oral Daily  . atorvastatin  80 mg Oral q1800  . carvedilol  6.25 mg Oral BID WC  . Influenza vac split quadrivalent PF  0.5 mL Intramuscular Tomorrow-1000  . lisinopril  2.5 mg Oral Daily  . sodium chloride flush  3 mL Intravenous Q12H   Continuous Infusions: . heparin 1,500 Units/hr (04/02/16 2048)  . nitroGLYCERIN     PRN Meds:.   Vital Signs    Vitals:   04/03/16 0019 04/03/16 0400 04/03/16 0413 04/03/16 0500  BP: 115/68  127/74   Pulse: 64  80   Resp: 20  (!) 21   Temp:  98.5 F (36.9 C)    TempSrc:  Oral    SpO2: 95%  97%   Weight:    227 lb 1.2 oz (103 kg)  Height:        Intake/Output Summary (Last 24 hours) at 04/03/16 0801 Last data filed at 04/03/16 0500  Gross per 24 hour  Intake           556.65 ml  Output             2900 ml  Net         -2343.35 ml   Filed Weights   04/02/16 0257 04/02/16 0815 04/03/16 0500  Weight: 226 lb (102.5 kg) 225 lb 12 oz (102.4 kg) 227 lb  1.2 oz (103 kg)    Physical Exam    GEN: Well nourished, well developed, in no acute distress.  HEENT: Grossly normal.  Neck: Supple, no JVD, carotid bruits, or masses. Cardiac: RRR, no murmurs, rubs, or gallops. No clubbing, cyanosis, edema.  Radials/DP/PT 2+ and equal bilaterally.  Respiratory:  Respirations regular and unlabored, clear to auscultation bilaterally. GI: Soft, nontender, nondistended, BS + x 4. MS: no deformity or atrophy. Skin: warm and dry, no rash. Neuro:  Strength and sensation are intact. Psych: AAOx3.  Normal affect.  Labs    CBC  Recent Labs  04/02/16 0301 04/02/16 0615  WBC 16.4* 19.6*  NEUTROABS 9.6*  --   HGB 17.2* 16.3  HCT 52.5* 48.7  MCV 91.8 91.5  PLT 277 [id number]   Basic Metabolic Panel  Recent Labs  04/02/16 0932 04/02/16 1826  NA 141 140  K 4.6 3.9  CL 105 103  CO2 29 27  GLUCOSE 135* 99  BUN 17 19  CREATININE 1.31* 1.31*  CALCIUM 8.7* 8.9   Liver Function Tests  Recent Labs  04/02/16 0615  AST 23  ALT 21  ALKPHOS 128*  BILITOT 1.1  PROT 6.8  ALBUMIN 3.6   No results for input(s): LIPASE, AMYLASE in the last 72 hours. Cardiac Enzymes  Recent Labs  04/02/16 0615 04/02/16 1125 04/02/16 1826  TROPONINI 0.06* 0.05* 0.06*   BNP Invalid input(s): POCBNP D-Dimer No results for input(s): DDIMER in the last 72 hours. Hemoglobin A1C No results for input(s): HGBA1C in the last 72 hours. Fasting Lipid Panel No results for input(s): CHOL, HDL, LDLCALC, TRIG, CHOLHDL, LDLDIRECT in the last 72 hours. Thyroid Function Tests  Recent Labs  04/02/16 0650  TSH 5.527*    Telemetry    NSR - Personally Reviewed  ECG    Sinus tachy 110, non other changes - Personally Reviewed  Radiology    Dg Chest Port 1 View  Result Date: 04/02/2016 CLINICAL DATA:  Congestive heart failure.  Shortness of breath. EXAM: PORTABLE CHEST 1 VIEW COMPARISON:  01/14/2016 FINDINGS: Postoperative changes in the mediastinum. Interval  development of cardiac enlargement with pulmonary vascular congestion. Diffuse airspace and interstitial infiltrates in the lungs consistent with edema. No pneumothorax. No definite pleural effusions. IMPRESSION: Interval development of congestive changes with cardiac enlargement, pulmonary vascular congestion, and diffuse pulmonary edema. Electronically Signed   By: Lucienne Capers M.D.   On: 04/02/2016 03:14     Cardiac Studies   Prior EF 20-25% prior to CABG 12/17/15, left atrial appendage clipping admitted with what sounds like sudden onset shortness of breath compatible with acute systolic heart failure/hypertensive emergency ? Placed on the cart of pain in emergency room in the setting of potential "atrial fibrillation "as noted by history and physical.  Patient Profile       Assessment & Plan    55 year old status post CABG with what sounds like acute systolic heart failure, possible atrial arrhythmia.  Acute systolic heart failure  - Lasix IV was administered  - Currently comfortable, 2.3 L out.  - He may have had flash pulmonary edema  - Continue with Lasix however daily dose would be adequate.  - We'll place on Lasix 20 mg once a day.  - If EF remains low, 20-25%, may wish to add digoxin 125.  - Posthospitalization, it would be nice for him to have a visit with advanced heart failure clinic.  - Could consider Entresto.  - Echocardiogram pending  Possible atrial fibrillation?  - I reviewed EMS strips. They do not appear to be atrial fibrillation.  - Unsure if this was accurate. This was documented however in the history and physical.  - If atrial fibrillation is present, he will need full anticoagulation.  - Since I do not see any evidence of atrial fibrillation currently, we will stop his heparin IV.  - Continue with carvedilol 6.25 mg twice a day.  - On low-dose lisinopril.    Questionable hypertensive urgency  - Blood pressure was 215/140 at home when he became  progressively shortness of breath.  - Brief use of Cardene drip. BiPAP.   It is possible that he had flash pulmonary edema/acute systolic heart failure in the setting of either hypertensive emergency in anticipation of intercourse versus atrial arrhythmia which triggered symptomatology.  We will go ahead and place on daily Lasix 20 mg once a day. I will also add spironolactone 12.5 mg once a day.  He appears clinically stable at this point. No crackles. Compensated. After echocardiogram, if feeling comfortable, I think discharge later today would be reasonable. At home he has been daily  weighing, able to walk 6 miles a day without difficulty recently.  Will place event monitor as outpatient. 30 day. Can pick up in [place].  Dr. Haroldine Laws saw as well. He will see after 30 day event monitor results in AHF clinic.  Has follow up with Dr. Raliegh Ip in December.   ADDEN: EF 30-35%. Opens up continued ICD discussion.  Discussed with patient and family.   Signed, Candee Furbish, MD  04/03/2016, 8:01 AM

## 2016-04-03 NOTE — Discharge Instructions (Signed)

## 2016-04-06 ENCOUNTER — Other Ambulatory Visit: Payer: Self-pay | Admitting: *Deleted

## 2016-04-06 DIAGNOSIS — I5043 Acute on chronic combined systolic (congestive) and diastolic (congestive) heart failure: Secondary | ICD-10-CM

## 2016-04-13 ENCOUNTER — Ambulatory Visit (INDEPENDENT_AMBULATORY_CARE_PROVIDER_SITE_OTHER): Payer: 59 | Admitting: Adult Health

## 2016-04-13 ENCOUNTER — Encounter: Payer: Self-pay | Admitting: Adult Health

## 2016-04-13 ENCOUNTER — Encounter: Payer: 59 | Admitting: Adult Health

## 2016-04-13 VITALS — BP 122/84 | HR 88 | Ht 72.0 in | Wt 223.0 lb

## 2016-04-13 DIAGNOSIS — I2510 Atherosclerotic heart disease of native coronary artery without angina pectoris: Secondary | ICD-10-CM

## 2016-04-13 DIAGNOSIS — I5022 Chronic systolic (congestive) heart failure: Secondary | ICD-10-CM | POA: Diagnosis not present

## 2016-04-13 DIAGNOSIS — I255 Ischemic cardiomyopathy: Secondary | ICD-10-CM | POA: Diagnosis not present

## 2016-04-13 NOTE — Progress Notes (Signed)
Name: [name] [name]    DOB: 1961/02/04  Age: 55 y.o.  MR#: [id number]       PCP:  Mickie Hillier, MD      Insurance: Payor: [employer] / Plan: [place] [employer] / Product Type: *No Product type* /   CC:   No chief complaint on file.   VS Vitals:   04/13/16 1452  Weight: 223 lb (101.2 kg)  Height: 6' (1.829 m)    Weights Current Weight  04/13/16 223 lb (101.2 kg)  04/03/16 227 lb 1.2 oz (103 kg)  02/21/16 225 lb (102.1 kg)    Blood Pressure  BP Readings from Last 3 Encounters:  04/03/16 140/90  02/21/16 135/79  02/10/16 139/63     Admit date:  (Not on file) Last encounter with RMR:  01/06/2016   Allergy No known allergies  Current Outpatient Prescriptions  Medication Sig Dispense Refill  . aspirin 81 MG EC tablet Take 1 tablet (81 mg total) by mouth daily. 30 tablet   . atorvastatin (LIPITOR) 80 MG tablet Take 1 tablet (80 mg total) by mouth daily at 6 PM. 30 tablet 3  . carvedilol (COREG) 6.25 MG tablet Take 1 tablet (6.25 mg total) by mouth 2 (two) times daily with a meal. 60 tablet 3  . furosemide (LASIX) 40 MG tablet Take 0.5 tablets (20 mg total) by mouth daily. 90 tablet 3  . lisinopril (PRINIVIL,ZESTRIL) 2.5 MG tablet Take 1 tablet (2.5 mg total) by mouth daily. 30 tablet 3  . spironolactone (ALDACTONE) 25 MG tablet Take 0.5 tablets (12.5 mg total) by mouth daily. 45 tablet 3   No current facility-administered medications for this visit.     Discontinued Meds:   There are no discontinued medications.  Patient Active Problem List   Diagnosis Date Noted  . Hypertensive emergency 04/02/2016  . Acute on chronic combined systolic and diastolic CHF, NYHA class 4 ([place]) 04/02/2016  . Acute kidney injury ([place]) 04/02/2016  . Acute respiratory failure with hypoxia ([place]) 04/02/2016  . Carotid stenosis, asymptomatic 02/09/2016  . Encounter for therapeutic drug monitoring 12/23/2015  . 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 Hypercholesterolemia  12/15/2015  . Elevated troponin 12/15/2015  . Coronary artery disease involving native coronary artery with unstable angina pectoris ([place]) 12/15/2015  . Cardiomyopathy, ischemic 12/14/2015  . Mitral regurgitation 12/14/2015  . Atrial fibrillation with RVR ([place]) 12/13/2015  . Atrial fibrillation with rapid ventricular response ([place]) 12/13/2015  . Cardiomyopathy ([place]) 12/13/2015  . Leukocytosis 12/13/2015  . Tobacco abuse     LABS    Component Value Date/Time   NA 136 04/03/2016 0812   NA 140 04/02/2016 1826   NA 141 04/02/2016 0932   K 3.5 04/03/2016 0812   K 3.9 04/02/2016 1826   K 4.6 04/02/2016 0932   CL 99 (L) 04/03/2016 0812   CL 103 04/02/2016 1826   CL 105 04/02/2016 0932   CO2 28 04/03/2016 0812   CO2 27 04/02/2016 1826   CO2 29 04/02/2016 0932   GLUCOSE 116 (H) 04/03/2016 0812   GLUCOSE 99 04/02/2016 1826   GLUCOSE 135 (H) 04/02/2016 0932   BUN 18 04/03/2016 0812   BUN 19 04/02/2016 1826   BUN 17 04/02/2016 0932   CREATININE 1.21 04/03/2016 0812   CREATININE 1.31 (H) 04/02/2016 1826   CREATININE 1.31 (H) 04/02/2016 0932   CALCIUM 9.2 04/03/2016 0812   CALCIUM 8.9 04/02/2016 1826   CALCIUM 8.7 (L) 04/02/2016 0932  GFRNONAA >60 04/03/2016 0812   GFRNONAA >60 04/02/2016 1826   GFRNONAA >60 04/02/2016 0932   GFRAA >60 04/03/2016 0812   GFRAA >60 04/02/2016 1826   GFRAA >60 04/02/2016 0932   CMP     Component Value Date/Time   NA 136 04/03/2016 0812   K 3.5 04/03/2016 0812   CL 99 (L) 04/03/2016 0812   CO2 28 04/03/2016 0812   GLUCOSE 116 (H) 04/03/2016 0812   BUN 18 04/03/2016 0812   CREATININE 1.21 04/03/2016 0812   CALCIUM 9.2 04/03/2016 0812   PROT 6.8 04/02/2016 0615   ALBUMIN 3.6 04/02/2016 0615   AST 23 04/02/2016 0615   ALT 21 04/02/2016 0615   ALKPHOS 128 (H) 04/02/2016 0615   BILITOT 1.1 04/02/2016 0615   GFRNONAA >60 04/03/2016 0812   GFRAA >60 04/03/2016 0812       Component Value Date/Time   WBC 12.2 (H) 04/03/2016 0812   WBC 19.6  (H) 04/02/2016 0615   WBC 16.4 (H) 04/02/2016 0301   HGB 15.2 04/03/2016 0812   HGB 16.3 04/02/2016 0615   HGB 17.2 (H) 04/02/2016 0301   HCT 45.2 04/03/2016 0812   HCT 48.7 04/02/2016 0615   HCT 52.5 (H) 04/02/2016 0301   MCV 88.8 04/03/2016 0812   MCV 91.5 04/02/2016 0615   MCV 91.8 04/02/2016 0301    Lipid Panel     Component Value Date/Time   CHOL 93 01/05/2016 0807   TRIG 65 01/05/2016 0807   HDL 25 (L) 01/05/2016 0807   CHOLHDL 3.7 01/05/2016 0807   VLDL 13 01/05/2016 0807   LDLCALC 55 01/05/2016 0807    ABG    Component Value Date/Time   PHART 7.412 12/18/2015 1232   PCO2ART 36.0 12/18/2015 1232   PO2ART 78.0 (L) 12/18/2015 1232   HCO3 22.8 12/18/2015 1232   TCO2 21 12/18/2015 1702   ACIDBASEDEF 1.0 12/18/2015 1232   O2SAT 95.0 12/18/2015 1232     Lab Results  Component Value Date   TSH 5.527 (H) 04/02/2016   BNP (last 3 results)  Recent Labs  01/05/16 0807 04/02/16 0301 04/02/16 0615  BNP 590.0* 325.4* 393.4*    ProBNP (last 3 results) No results for input(s): PROBNP in the last 8760 hours.  Cardiac Panel (last 3 results) No results for input(s): CKTOTAL, CKMB, TROPONINI, RELINDX in the last 72 hours.  Iron/TIBC/Ferritin/ %Sat No results found for: IRON, TIBC, FERRITIN, IRONPCTSAT   EKG Orders placed or performed during the hospital encounter of 04/02/16  . EKG 12-Lead  . EKG 12-Lead  . EKG     Prior Assessment and Plan Problem List as of 04/13/2016 Reviewed: 04/03/2016  1:10 PM by Kerin Ransom, PA-C     Cardiovascular and Mediastinum   Acute on chronic combined systolic and diastolic CHF, NYHA class 4 (HCC)   Atrial fibrillation with RVR ([place])   Atrial fibrillation with rapid ventricular response (HCC)   Cardiomyopathy ([place])   Cardiomyopathy, ischemic   Mitral regurgitation   Coronary artery disease involving native coronary artery with unstable angina pectoris (HCC)   Carotid stenosis, asymptomatic   Hypertensive emergency      Respiratory   Acute respiratory failure with hypoxia (HCC)     Genitourinary   Acute kidney injury ([place])     Other   Tobacco abuse   Hypercholesterolemia   Leukocytosis   Elevated troponin   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  Shortness of breath. EXAM: PORTABLE CHEST 1 VIEW COMPARISON:  01/14/2016 FINDINGS: Postoperative changes in the mediastinum. Interval development of cardiac enlargement with pulmonary vascular congestion. Diffuse airspace and interstitial infiltrates in the lungs consistent with edema. No pneumothorax. No definite pleural effusions. IMPRESSION: Interval development of congestive changes with cardiac enlargement, pulmonary vascular congestion, and diffuse pulmonary edema. Electronically Signed   By: Lucienne Capers M.D.   On: 04/02/2016 03:14

## 2016-04-13 NOTE — Patient Instructions (Signed)
Your physician recommends that you schedule a follow-up appointment with Dr. Domenic Polite.   Your physician recommends that you continue on your current medications as directed. Please refer to the Current Medication list given to you today.  If you need a refill on your cardiac medications before your next appointment, please call your pharmacy.  Thank you for choosing [facility]!

## 2016-04-13 NOTE — Progress Notes (Signed)
Cardiology Office Note   Date:  04/13/2016   ID:  [name], [name] 1961-06-07, MRN [id number]  PCP:  Mickie Hillier, MD  Cardiologist: Woodroe Chen, NP   Chief Complaint  Patient presents with  . Coronary Artery Disease  . Cardiomyopathy      History of Present Illness: [name] [name] is a 55 y.o. male who presents for ongoing assessment and management of  chronic combined systolic and diastolic heart failure, New York Heart Association class IV, atrial fibrillation with RVR, ischemic cardiomyopathy, coronary artery disease with CABG 3 and clipping of LA appendage, hypertension, and hypercholesterolemia.  The patient was discharged on 04/03/2016 after admission for acute on chronic combined systolic and diastolic heart failure. Echocardiogram was completed on 04/03/2016.  Left ventricle: The cavity size was mildly dilated. Systolic function was moderately to severely reduced. The estimated ejection fraction was in the range of 30% to 35%. Diffuse hypokinesis. Features are consistent with a pseudonormal left ventricular filling pattern, with concomitant abnormal relaxation and increased filling pressure (grade 2 diastolic dysfunction). Doppler parameters are consistent with high ventricular filling pressure. - Aortic valve: Transvalvular velocity was within the normal range. There was no stenosis. There was trivial regurgitation. - Mitral valve: Transvalvular velocity was within the normal range. There was no evidence for stenosis. There was mild regurgitation. - Left atrium: The atrium was severely dilated. - Right ventricle: The cavity size was normal. Wall thickness was normal. Systolic function was normal. - Atrial septum: No defect or patent foramen ovale was identified by color flow Doppler. - Tricuspid valve: There was no regurgitation. - Pulmonary arteries: Systolic pressure was within the normal range. PA peak pressure: 20 mm Hg  (S).  During hospitalization the patient was placed on BiPAP with improvement and hypoxia. He was also found to be hypertensive. He was given IV Lasix and diuresis 2.3 L. He was placed on a 30 day event monitor to evaluate for paroxysmal atrial fibrillation.  He comes today talking at length about his medical issues, fluid retention, he admitted to some dietary noncompliance causing him to have CHF. He also has multiple questions concerning paroxysmal 8 fibrillation and why he must have the cardiac monitor. He states that from now on he is weighing himself every day, monitoring his salt intake,  Past Medical History:  Diagnosis Date  . Acute on chronic combined systolic and diastolic CHF, NYHA class 4 ([name]) 04/02/2016  . Acute systolic congestive heart failure ([name])   . Atrial fibrillation with rapid ventricular response ([name]) 12/13/2015  . Cardiomyopathy ([name]) 12/13/2015  . Cardiomyopathy, ischemic 12/14/2015   EF 20-25%  . Coronary artery disease involving native coronary artery with unstable angina pectoris ([name]) 12/15/2015  . GERD (gastroesophageal reflux disease)    pt. denies  . Hypercholesterolemia 12/15/2015  . Mitral regurgitation 12/14/2015   moderate  . NSTEMI (non-ST elevated myocardial infarction) ([name])   . PONV (postoperative nausea and vomiting)    "sick with very first surgery but nothing since"  . 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 Tobacco abuse     Past Surgical History:  Procedure Laterality Date  . CARDIAC CATHETERIZATION N/A 12/15/2015   Procedure: Right/Left Heart Cath and Coronary Angiography;  Surgeon: Wellington Hampshire, MD;  Location: [facility];  Service: Cardiovascular;  Laterality: N/A;  . CLIPPING OF ATRIAL  APPENDAGE Left 12/17/2015   Procedure: CLIPPING OF ATRIAL APPENDAGE;  Surgeon: [name], MD;  Location: [facility];  Service: Open Heart  Surgery;  Laterality: Left;  . CORONARY ARTERY BYPASS GRAFT N/A 12/17/2015   Procedure: CORONARY ARTERY BYPASS GRAFTING (CABG) x three , using left internal mammary artery and right leg greater saphenous vein;  Surgeon: [name], MD;  Location: [facility];  Service: Open Heart Surgery;  Laterality: N/A;  . ENDARTERECTOMY Left 02/09/2016   Procedure: LEFT CAROTID ENDARTERECTOMY;  Surgeon: Serafina Mitchell, MD;  Location: [facility];  Service: Vascular;  Laterality: Left;  . KNEE ARTHROSCOPY Right 1991  . PATCH ANGIOPLASTY Left 02/09/2016   Procedure: PATCH ANGIOPLASTY USING Rueben Bash BIOLOGIC PATCH;  Surgeon: Serafina Mitchell, MD;  Location: [facility] OR;  Service: Vascular;  Laterality: Left;     Current Outpatient Prescriptions  Medication Sig Dispense Refill  . aspirin 81 MG EC tablet Take 1 tablet (81 mg total) by mouth daily. 30 tablet   . atorvastatin (LIPITOR) 80 MG tablet Take 1 tablet (80 mg total) by mouth daily at 6 PM. 30 tablet 3  . carvedilol (COREG) 6.25 MG tablet Take 1 tablet (6.25 mg total) by mouth 2 (two) times daily with a meal. 60 tablet 3  . furosemide (LASIX) 40 MG tablet Take 0.5 tablets (20 mg total) by mouth daily. 90 tablet 3  . lisinopril (PRINIVIL,ZESTRIL) 2.5 MG tablet Take 1 tablet (2.5 mg total) by mouth daily. 30 tablet 3  . spironolactone (ALDACTONE) 25 MG tablet Take 0.5 tablets (12.5 mg total) by mouth daily. 45 tablet 3   No current facility-administered medications for this visit.     Allergies:   No known allergies    Social History:  The patient  reports that he quit smoking about 4 months ago. His smoking use included Cigarettes. He smoked 1.00 pack per day. He quit smokeless tobacco use about 4 months ago. He reports that he drinks alcohol. He reports that he does not use drugs.   Family History:  The patient's family history includes Atrial fibrillation in his father; Heart attack in his paternal grandfather; Heart failure in his father.    ROS: All other  systems are reviewed and negative. Unless otherwise mentioned in H&P    PHYSICAL EXAM: VS:  BP 122/84   Pulse 88   Ht 6' (1.829 m)   Wt 223 lb (101.2 kg)   SpO2 99%   BMI 30.24 kg/m  , BMI Body mass index is 30.24 kg/m. GEN: Well nourished, well developed, in no acute distress  HEENT: normal  Neck: no JVD, carotid bruits, or masses Cardiac: RRR; Distant heart sounds, no murmurs, rubs, or gallops,no edema wearing cardiac monitor. Respiratory:  clear to auscultation bilaterally, normal work of breathing GI: soft, nontender, nondistended, + BS MS: no deformity or atrophy  Skin: warm and dry, no rash Neuro:  Strength and sensation are intact Psych: euthymic mood, full affect   Recent Labs: 12/18/2015: Magnesium 1.9 04/02/2016: ALT 21; B Natriuretic Peptide 393.4; TSH 5.527 04/03/2016: BUN 18; Creatinine, Ser 1.21; Hemoglobin 15.2; Platelets 212; Potassium 3.5; Sodium 136    Lipid Panel    Component Value Date/Time   CHOL 93 01/05/2016 0807   TRIG 65 01/05/2016 0807   HDL 25 (L) 01/05/2016 0807   CHOLHDL 3.7 01/05/2016 0807   VLDL 13 01/05/2016 0807   LDLCALC 55 01/05/2016 0807      Wt Readings from Last 3 Encounters:  04/13/16 223  lb (101.2 kg)  04/03/16 227 lb 1.2 oz (103 kg)  02/21/16 225 lb (102.1 kg)       ASSESSMENT AND PLAN:  1. Coronary artery disease: Status post coronary artery bypass grafting. The patient will continue his current medication regimen. This includes carvedilol 6.25 mg twice a day, atorvastatin, lisinopril 2.5 mg. he is due to follow-up with Dr. Dr. Bronson Ing in December 2017, he is to keep that appointment. We'll defer repeating echocardiogram as one has been recently completed during this admission.  2. Chronic systolic CHF with systolic dysfunction: Recently admitted for decompensation and diuresis with IV Lasix. He is now on daily Lasix as opposed to when necessary. He will continue sodium restriction and daily weights, and report any  significant weight gain. He has been advised to take 40 mg of Lasix if he gains weight  He will continue to wear his cardiac monitor until 05/02/2016. A significant arrhythmias or abnormalities will be reported and the patient will be seen sooner.  3. Hypertension: Currently blood pressure is well-controlled. Will not make any changes in his medication at this time. He will continue furosemide lisinopril and carvedilol.  Current medicines are reviewed at length with the patient today.    Labs/ tests ordered today include: No orders of the defined types were placed in this encounter.    Disposition:   FU with Dr. Bronson Ing imperially scheduled visit.  Signed, Jory Sims, NP  04/13/2016 5:20 PM    [facility] [address] [place], [place] [address] Phone: [phone]; Fax: [phone]

## 2016-04-21 ENCOUNTER — Ambulatory Visit (INDEPENDENT_AMBULATORY_CARE_PROVIDER_SITE_OTHER): Payer: 59 | Admitting: Family Medicine

## 2016-04-21 VITALS — BP 136/84 | Ht 72.0 in | Wt 222.0 lb

## 2016-04-21 DIAGNOSIS — E7800 Pure hypercholesterolemia, unspecified: Secondary | ICD-10-CM

## 2016-04-21 DIAGNOSIS — H17822 Peripheral opacity of cornea, left eye: Secondary | ICD-10-CM | POA: Diagnosis not present

## 2016-04-21 DIAGNOSIS — I10 Essential (primary) hypertension: Secondary | ICD-10-CM | POA: Diagnosis not present

## 2016-04-21 DIAGNOSIS — H5213 Myopia, bilateral: Secondary | ICD-10-CM | POA: Diagnosis not present

## 2016-04-21 DIAGNOSIS — I509 Heart failure, unspecified: Secondary | ICD-10-CM | POA: Diagnosis not present

## 2016-04-21 DIAGNOSIS — R7301 Impaired fasting glucose: Secondary | ICD-10-CM

## 2016-04-21 DIAGNOSIS — H52223 Regular astigmatism, bilateral: Secondary | ICD-10-CM | POA: Diagnosis not present

## 2016-04-21 DIAGNOSIS — H524 Presbyopia: Secondary | ICD-10-CM | POA: Diagnosis not present

## 2016-04-21 MED ORDER — ATORVASTATIN CALCIUM 80 MG PO TABS: 80.0000 mg | ORAL_TABLET | Freq: Every day | ORAL | 1 refills | Status: DC

## 2016-04-21 NOTE — Progress Notes (Signed)
   Subjective:    Patient ID: [name] [name], male    DOB: 02/04/1961, 55 y.o.   MRN: [id number]  Hyperlipidemia  This is a chronic problem. Focal sensory loss: lipitor.  Patient continues to take lipid medication regularly. No obvious side effects from it. Generally does not miss a dose. Prior blood work results are reviewed with patient. Patient continues to work on fat intake in diet  Needs refill on pt wants to know if dr Richardson Landry will take over prescribing meds that heart surgeon prescribed.   Cyst on left wrist. Came up 2 -3 weeks ago.   Cyst in left wrist, non painful No injury  Blood pressure medicine and blood pressure levels reviewed today with patient. Compliant with blood pressure medicine. States does not miss a dose. No obvious side effects. Blood pressure generally good when checked elsewhere. Watching salt intake.    Hx of swelling and sob and recent admission for chf, Patient experienced flash pulmonary edema and was admitted to the [facility] records reviewed in presence of patient today    Review of Systems No headache, no major weight loss or weight gain, no chest pain no back pain abdominal pain no change in bowel habits complete ROS otherwise negative     Objective:   Physical Exam  Alert no acute distress. Blood pressure good on repeat lungs diminished breath sounds diffusely no wheezes no crackles heart rhythm currently regular ankles no significant edema      Assessment & Plan:  Impression first of all, very long discussion held regarding all of patient's challenges at this time #2 hyperlipidemia tolerating statin well to maintain #3 hypertension good control #4 coronary artery disease #5 congestive heart failure long discussion held about what this does mean and does not need #6 intermittent atrial fibrillation currently in midst of workup with monitor plan I've advised patient I would feel more comfortable with his cardiologist maintaining all of his  congestive heart failure medicines for now, particularly early in the process while they are adjusting all of these interventions. Rationale discussed. We will maintain the lipid medication., Eventually we may take over some other these meds but while they are adjusting in best interest of the patient to maintain with the folks doing the serial echocardiograms etc. to monitor the heart failure. Patient expresses understanding reassurance regarding cyst, blood work before next visit recheck in 4 months

## 2016-04-26 ENCOUNTER — Encounter: Payer: Self-pay | Admitting: *Deleted

## 2016-04-26 ENCOUNTER — Other Ambulatory Visit: Payer: Self-pay | Admitting: *Deleted

## 2016-04-26 NOTE — Patient Outreach (Addendum)
[facility]) Care Management  04/26/2016  [name] [name] [birth year] [id number]   Subjective: Telephone call to patient's home number, spoke with patient, and HIPAA verified.   Discussed [facility] Care Management [facility] Transition of care follow up and patient in agreement to complete. Patient states he is doing well, has returned to work, only has a few minutes to talk, and is on his way out the door. States he utilized the [facility] for his medications.  Had a scale and used the $5 blood cuff voucher to obtain cuff at the outpatient pharmacy.  He is exercising daily, using fit bit and recording activity in [facility] wellness site.   Patient states he does not have any transition of care, care coordination, disease management, disease monitoring, transportation, community resource, or pharmacy needs at this time.  States he is very appreciative of the follow up call and in agreement to receive [facility] information.  Objective: Per chart review:  Patient hospitalized  04/02/16 -04/03/16 for acute on chronic combined congestive heart failure.    Patient hospitalized 02/09/16 -02/10/16 for Left carotid artery stenosis.   Status post left carotid Endarterectomy with bovine pericardial patch angioplasty on 02/09/16.   Patient hospitalized 12/13/15 - [id number] for Acute systolic congestive heart failure, Coronary artery disease involving native coronary artery with unstable angina pectoris, NSTEMI (non-ST elevated myocardial infarction), Atrial fibrillation, and Cardiomyopathy. Patient also has a history of Hypercholesterolemia, Mitral regurgitation, and Tobacco abuse.   Last transition of care follow up completed on 12/30/15.    Assessment: Received [facility] Transition of care referral on 04/21/16. Transition of care follow up completed and patient has no further care management needs at this time.   Plan: RNCM will send patient successful outreach  letter, [facility] pamphlet, and magnet. RNCM will send case closure due to follow up completion / no additional care management needs request to [facility].    Anhelica Fowers H. Annia Friendly, BSN, [facility] Telephonic CM Phone: [phone] Fax: [phone]

## 2016-05-01 ENCOUNTER — Other Ambulatory Visit: Payer: Self-pay | Admitting: Family Medicine

## 2016-05-09 ENCOUNTER — Ambulatory Visit (INDEPENDENT_AMBULATORY_CARE_PROVIDER_SITE_OTHER): Payer: 59 | Admitting: Gastroenterology

## 2016-05-09 ENCOUNTER — Encounter: Payer: Self-pay | Admitting: Gastroenterology

## 2016-05-09 VITALS — BP 142/86 | HR 74 | Ht 72.0 in | Wt 225.5 lb

## 2016-05-09 DIAGNOSIS — I255 Ischemic cardiomyopathy: Secondary | ICD-10-CM | POA: Diagnosis not present

## 2016-05-09 DIAGNOSIS — Z1211 Encounter for screening for malignant neoplasm of colon: Secondary | ICD-10-CM | POA: Diagnosis not present

## 2016-05-09 MED ORDER — NA SULFATE-K SULFATE-MG SULF 17.5-3.13-1.6 GM/177ML PO SOLN: 1.0000 | Freq: Once | ORAL | 0 refills | Status: AC

## 2016-05-09 NOTE — Progress Notes (Signed)
HPI :  55 y/o male with significant cardiac history here to discuss colon cancer screening. He has a history of CHF, ischemic cardiomyopathy s/p CABG, atrial fibrillation with RVR, PVD with carotid artery stenosis s/p end arterectomy in August of this year. He was recently hospitalized in early October for what he reports was "flash pulmonary edema". Echo during his hospitalization showed EF 30-35%.   No prior colon cancer screening. No FH of colon cancer. No trouble with bowels. No blood in the stools. No constipation or diarrhea. No anemia. He has a significant tobacco history.    Past Medical History:  Diagnosis Date  . Acute on chronic combined systolic and diastolic CHF, NYHA class 4 ([place]) 04/02/2016  . Acute systolic congestive heart failure ([place])   . Atrial fibrillation with rapid ventricular response ([place]) 12/13/2015  . Cardiomyopathy ([place]) 12/13/2015  . Cardiomyopathy, ischemic 12/14/2015   EF 20-25%  . Coronary artery disease involving native coronary artery with unstable angina pectoris ([place]) 12/15/2015  . GERD (gastroesophageal reflux disease)    pt. denies  . Hypercholesterolemia 12/15/2015  . Mitral regurgitation 12/14/2015   moderate  . NSTEMI (non-ST elevated myocardial infarction) ([place])   . PONV (postoperative nausea and vomiting)    "sick with very first surgery but nothing since"  . 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 Tobacco abuse      Past Surgical History:  Procedure Laterality Date  . CARDIAC CATHETERIZATION N/A 12/15/2015   Procedure: Right/Left Heart Cath and Coronary Angiography;  Surgeon: Wellington Hampshire, MD;  Location: [facility];  Service: Cardiovascular;  Laterality: N/A;  . CLIPPING OF ATRIAL APPENDAGE Left 12/17/2015   Procedure: CLIPPING OF ATRIAL APPENDAGE;  Surgeon: [name], MD;  Location: [facility];  Service: Open Heart Surgery;   Laterality: Left;  . CORONARY ARTERY BYPASS GRAFT N/A 12/17/2015   Procedure: CORONARY ARTERY BYPASS GRAFTING (CABG) x three , using left internal mammary artery and right leg greater saphenous vein;  Surgeon: [name], MD;  Location: [facility];  Service: Open Heart Surgery;  Laterality: N/A;  . ENDARTERECTOMY Left 02/09/2016   Procedure: LEFT CAROTID ENDARTERECTOMY;  Surgeon: Serafina Mitchell, MD;  Location: [facility];  Service: Vascular;  Laterality: Left;  . KNEE ARTHROSCOPY Right 1991  . PATCH ANGIOPLASTY Left 02/09/2016   Procedure: PATCH ANGIOPLASTY USING Rueben Bash BIOLOGIC PATCH;  Surgeon: Serafina Mitchell, MD;  Location: [facility];  Service: Vascular;  Laterality: Left;   Family History  Problem Relation Age of Onset  . Heart attack Paternal Grandfather   . Heart failure Father   . Atrial fibrillation Father    Social History  Substance Use Topics  . Smoking status: Former Smoker    Packs/day: 1.00    Years: 35.00    Types: Cigarettes    Quit date: 12/13/2015  . Smokeless tobacco: Former Systems developer    Quit date: 12/13/2015  . Alcohol use 0.0 oz/week     Comment: rare   Current Outpatient Prescriptions  Medication Sig Dispense Refill  . aspirin 81 MG EC tablet Take 1 tablet (81 mg total) by mouth daily. 30 tablet   . atorvastatin (LIPITOR) 80 MG tablet Take 1 tablet (80 mg total) by mouth daily at 6 PM. 90 tablet 1  . carvedilol (COREG) 6.25 MG tablet Take 1 tablet (6.25 mg total)  by mouth 2 (two) times daily with a meal. 60 tablet 3  . furosemide (LASIX) 40 MG tablet Take 0.5 tablets (20 mg total) by mouth daily. 90 tablet 3  . KLOR-CON M20 20 MEQ tablet TAKE 1 TABLET BY MOUTH ONCE DAILY 90 tablet 1  . lisinopril (PRINIVIL,ZESTRIL) 2.5 MG tablet Take 1 tablet (2.5 mg total) by mouth daily. 30 tablet 3  . spironolactone (ALDACTONE) 25 MG tablet Take 0.5 tablets (12.5 mg total) by mouth daily. 45 tablet 3   No current facility-administered medications for this visit.    Allergies  Allergen  Reactions  . No Known Allergies Other (See Comments)     Review of Systems: All systems reviewed and negative except where noted in HPI.   Lab Results  Component Value Date   WBC 12.2 (H) 04/03/2016   HGB 15.2 04/03/2016   HCT 45.2 04/03/2016   MCV 88.8 04/03/2016   PLT 212 04/03/2016    Lab Results  Component Value Date   CREATININE 1.21 04/03/2016   BUN 18 04/03/2016   NA 136 04/03/2016   K 3.5 04/03/2016   CL 99 (L) 04/03/2016   CO2 28 04/03/2016       Physical Exam: BP (!) 142/86   Pulse 74   Ht 6' (1.829 m) Comment: measure without shoes  Wt 225 lb 8 oz (102.3 kg)   BMI 30.58 kg/m  Constitutional: Pleasant,well-developed, male in no acute distress. HEENT: Normocephalic and atraumatic. Conjunctivae are normal. No scleral icterus. Neck supple.  Cardiovascular: Normal rate, regular rhythm.  Pulmonary/chest: Effort normal and breath sounds normal. No wheezing, rales or rhonchi. Abdominal: Soft, nondistended, nontender.  There are no masses palpable. No hepatomegaly. Extremities: no edema Lymphadenopathy: No cervical adenopathy noted. Neurological: Alert and oriented to person place and time. Skin: Skin is warm and dry. No rashes noted. Psychiatric: Normal mood and affect. Behavior is normal.   ASSESSMENT AND PLAN: 55 year old male with complex cardiac history as outlined above, presenting for colon cancer screening. He has no anemia or any lower tract GI symptoms that are concerning, but does have a history of long-standing tobacco use. We discussed screening modalities to include optical colonoscopy and stool based testing. Given his cardiac history he is at higher risk for anesthesia related complications, however he seems to be doing really well since his last hospitalization. After discussion of options patient strongly wished to have optical colonoscopy versus stool based testing, given with his tobacco use and that he is overdue for screening it's more than  likely he has colon polyps.   The patient has follow-up scheduled cardiology in a few weeks. If he is cleared by them for colonoscopy will proceed with optical colonoscopy. With his most recent EF of 35% he should be cleared to have this done at the [facility]. He is not cleared then we'll consider stool based testing or postpone colonoscopy until he is ready for it.   [place] Cellar, MD [facility] Pager [phone]  CC: Mikey Kirschner, MD

## 2016-05-09 NOTE — Patient Instructions (Signed)
If you are age 53 or older, your body mass index should be between 23-30. Your Body mass index is 30.58 kg/m. If this is out of the aforementioned range listed, please consider follow up with your Primary Care Provider.  If you are age 39 or younger, your body mass index should be between 19-25. Your Body mass index is 30.58 kg/m. If this is out of the aformentioned range listed, please consider follow up with your Primary Care Provider.   You have been scheduled for a colonoscopy. Please follow written instructions given to you at your visit today.  Please pick up your prep supplies at the pharmacy within the next 1-3 days. If you use inhalers (even only as needed), please bring them with you on the day of your procedure. Your physician has requested that you go to www.startemmi.com and enter the access code given to you at your visit today. This web site gives a general overview about your procedure. However, you should still follow specific instructions given to you by our office regarding your preparation for the procedure.

## 2016-05-12 ENCOUNTER — Other Ambulatory Visit (HOSPITAL_COMMUNITY): Payer: 59

## 2016-05-23 ENCOUNTER — Ambulatory Visit: Payer: 59 | Admitting: Cardiovascular Disease

## 2016-05-24 ENCOUNTER — Ambulatory Visit (HOSPITAL_COMMUNITY)
Admission: RE | Admit: 2016-05-24 | Discharge: 2016-05-24 | Disposition: A | Discharge status: Home / Self Care | Payer: 59 | Source: Ambulatory Visit | Attending: Internal Medicine | Admitting: Internal Medicine

## 2016-05-24 ENCOUNTER — Encounter (HOSPITAL_COMMUNITY): Payer: Self-pay | Admitting: Internal Medicine

## 2016-05-24 VITALS — BP 148/88 | HR 80 | Wt 228.8 lb

## 2016-05-24 DIAGNOSIS — E7800 Pure hypercholesterolemia, unspecified: Secondary | ICD-10-CM | POA: Insufficient documentation

## 2016-05-24 DIAGNOSIS — I255 Ischemic cardiomyopathy: Secondary | ICD-10-CM | POA: Insufficient documentation

## 2016-05-24 DIAGNOSIS — I110 Hypertensive heart disease with heart failure: Secondary | ICD-10-CM | POA: Insufficient documentation

## 2016-05-24 DIAGNOSIS — F17210 Nicotine dependence, cigarettes, uncomplicated: Secondary | ICD-10-CM | POA: Diagnosis not present

## 2016-05-24 DIAGNOSIS — Z7982 Long term (current) use of aspirin: Secondary | ICD-10-CM | POA: Insufficient documentation

## 2016-05-24 DIAGNOSIS — I480 Paroxysmal atrial fibrillation: Secondary | ICD-10-CM | POA: Insufficient documentation

## 2016-05-24 DIAGNOSIS — I5022 Chronic systolic (congestive) heart failure: Secondary | ICD-10-CM

## 2016-05-24 DIAGNOSIS — Z8249 Family history of ischemic heart disease and other diseases of the circulatory system: Secondary | ICD-10-CM | POA: Diagnosis not present

## 2016-05-24 DIAGNOSIS — I252 Old myocardial infarction: Secondary | ICD-10-CM | POA: Insufficient documentation

## 2016-05-24 DIAGNOSIS — K219 Gastro-esophageal reflux disease without esophagitis: Secondary | ICD-10-CM | POA: Insufficient documentation

## 2016-05-24 DIAGNOSIS — I6529 Occlusion and stenosis of unspecified carotid artery: Secondary | ICD-10-CM | POA: Diagnosis not present

## 2016-05-24 DIAGNOSIS — I340 Nonrheumatic mitral (valve) insufficiency: Secondary | ICD-10-CM | POA: Insufficient documentation

## 2016-05-24 DIAGNOSIS — Z9889 Other specified postprocedural states: Secondary | ICD-10-CM | POA: Insufficient documentation

## 2016-05-24 DIAGNOSIS — Z951 Presence of aortocoronary bypass graft: Secondary | ICD-10-CM | POA: Insufficient documentation

## 2016-05-24 DIAGNOSIS — I2510 Atherosclerotic heart disease of native coronary artery without angina pectoris: Secondary | ICD-10-CM | POA: Insufficient documentation

## 2016-05-24 DIAGNOSIS — I5042 Chronic combined systolic (congestive) and diastolic (congestive) heart failure: Secondary | ICD-10-CM | POA: Diagnosis not present

## 2016-05-24 MED ORDER — CARVEDILOL 6.25 MG PO TABS: 6.2500 mg | ORAL_TABLET | Freq: Two times a day (BID) | ORAL | 3 refills | Status: DC

## 2016-05-24 MED ORDER — SPIRONOLACTONE 25 MG PO TABS: 12.5000 mg | ORAL_TABLET | Freq: Every day | ORAL | 3 refills | Status: DC

## 2016-05-24 MED ORDER — SACUBITRIL-VALSARTAN 24-26 MG PO TABS: 1.0000 | ORAL_TABLET | Freq: Two times a day (BID) | ORAL | 3 refills | Status: DC

## 2016-05-24 MED ORDER — FUROSEMIDE 40 MG PO TABS: 20.0000 mg | ORAL_TABLET | Freq: Every day | ORAL | 3 refills | Status: DC

## 2016-05-24 MED ORDER — SILDENAFIL CITRATE 100 MG PO TABS: 100.0000 mg | ORAL_TABLET | Freq: Every day | ORAL | 2 refills | Status: DC | PRN

## 2016-05-24 NOTE — Patient Instructions (Signed)
Stop Lisinopril.  Start Entresto 24/26mg   in 36 hours (Friday morning) take 1 tablet twice daily.  Follow up with Dr.Bensimhon as needed.

## 2016-05-24 NOTE — Addendum Note (Signed)
Encounter addended by: Harvie Junior, CMA on: 05/24/2016 12:20 PM<BR>    Actions taken: Medication long-term status modified, Order list changed, Sign clinical note

## 2016-05-24 NOTE — Progress Notes (Signed)
ADVANCED HF CLINIC NOTE   Date:  05/24/2016   ID:  CASETON [name], DOB 16-Jan-1961, MRN [id number]  PCP:  Mickie Hillier, MD  Cardiologist: Kenna Gilbert, Mohamad Bruso, MD     History of Present Illness: [name] [name] is a 55 y.o. male with CAD s/p CABG 6/17 with clipping of LA appendage, carotid stenosis s/p L CEA 8/17, PAF, chronic systolic HF EF 99991111.   The patient was discharged on 04/03/2016 after admission for acute on chronic combined systolic and diastolic heart failure with the question of ? PAF with RVR. During hospitalization the patient was placed on BiPAP with improvement and hypoxia. He was also found to be hypertensive. He was given IV Lasix and diuresis 2.3 L. He was placed on a 30 day event monitor to evaluate for paroxysmal atrial fibrillation.Echocardiogram was completed on 04/03/2016.  Left ventricle: The cavity size was mildly dilated. Systolic function was moderately to severely reduced. The estimated ejection fraction was in the range of 30% to 35%. Diffuse hypokinesis. Features are consistent with a pseudonormal left ventricular filling pattern, with concomitant abnormal relaxation and increased filling pressure (grade 2 diastolic dysfunction). Doppler parameters are consistent with high ventricular filling pressure. - Aortic valve: Transvalvular velocity was within the normal range. There was no stenosis. There was trivial regurgitation. - Mitral valve: Transvalvular velocity was within the normal range. There was no evidence for stenosis. There was mild regurgitation. - Left atrium: The atrium was severely dilated. - Right ventricle: The cavity size was normal. Wall thickness was normal. Systolic function was normal. - Atrial septum: No defect or patent foramen ovale was identified by color flow Doppler. - Tricuspid valve: There was no regurgitation. - Pulmonary arteries: Systolic pressure was within the normal range. PA peak  pressure: 20 mm Hg (S).  Returns for f/u. Wore 30-day monitor and no recurrent AF. Feels great. Back to work. Fluid well controlled. Weigh every morning. Weight stable at 220. Appetite very good. No palpitation, orthopnea, PND, CP or DOE. SBP 120s     Past Medical History:  Diagnosis Date  . Acute on chronic combined systolic and diastolic CHF, NYHA class 4 ([place]) 04/02/2016  . Acute systolic congestive heart failure ([place])   . Atrial fibrillation with rapid ventricular response ([place]) 12/13/2015  . Cardiomyopathy ([place]) 12/13/2015  . Cardiomyopathy, ischemic 12/14/2015   EF 20-25%  . Coronary artery disease involving native coronary artery with unstable angina pectoris ([place]) 12/15/2015  . GERD (gastroesophageal reflux disease)    pt. denies  . Hypercholesterolemia 12/15/2015  . Mitral regurgitation 12/14/2015   moderate  . NSTEMI (non-ST elevated myocardial infarction) ([place])   . PONV (postoperative nausea and vomiting)    "sick with very first surgery but nothing since"  . 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 Tobacco abuse     Past Surgical History:  Procedure Laterality Date  . CARDIAC CATHETERIZATION N/A 12/15/2015   Procedure: Right/Left Heart Cath and Coronary Angiography;  Surgeon: Wellington Hampshire, MD;  Location: [facility];  Service: Cardiovascular;  Laterality: N/A;  . CLIPPING OF ATRIAL APPENDAGE Left 12/17/2015   Procedure: CLIPPING OF ATRIAL APPENDAGE;  Surgeon: [name], MD;  Location: [facility];  Service: Open Heart Surgery;  Laterality: Left;  . CORONARY ARTERY BYPASS GRAFT N/A 12/17/2015   Procedure: CORONARY ARTERY BYPASS GRAFTING (CABG)  x three , using left internal mammary artery and right leg greater saphenous vein;  Surgeon: [name], MD;  Location: [facility];  Service: Open Heart Surgery;  Laterality: N/A;  . ENDARTERECTOMY Left 02/09/2016   Procedure:  LEFT CAROTID ENDARTERECTOMY;  Surgeon: Serafina Mitchell, MD;  Location: [facility];  Service: Vascular;  Laterality: Left;  . KNEE ARTHROSCOPY Right 1991  . PATCH ANGIOPLASTY Left 02/09/2016   Procedure: PATCH ANGIOPLASTY USING Rueben Bash BIOLOGIC PATCH;  Surgeon: Serafina Mitchell, MD;  Location: [facility] OR;  Service: Vascular;  Laterality: Left;     Current Outpatient Prescriptions  Medication Sig Dispense Refill  . aspirin 81 MG EC tablet Take 1 tablet (81 mg total) by mouth daily. 30 tablet   . atorvastatin (LIPITOR) 80 MG tablet Take 1 tablet (80 mg total) by mouth daily at 6 PM. 90 tablet 1  . carvedilol (COREG) 6.25 MG tablet Take 1 tablet (6.25 mg total) by mouth 2 (two) times daily with a meal. 60 tablet 3  . furosemide (LASIX) 40 MG tablet Take 0.5 tablets (20 mg total) by mouth daily. 90 tablet 3  . lisinopril (PRINIVIL,ZESTRIL) 2.5 MG tablet Take 1 tablet (2.5 mg total) by mouth daily. 30 tablet 3  . spironolactone (ALDACTONE) 25 MG tablet Take 0.5 tablets (12.5 mg total) by mouth daily. 45 tablet 3   No current facility-administered medications for this encounter.     Allergies:   No known allergies    Social History:  The patient  reports that he quit smoking about 5 months ago. His smoking use included Cigarettes. He has a 35.00 pack-year smoking history. He quit smokeless tobacco use about 5 months ago. He reports that he drinks alcohol. He reports that he does not use drugs.   Family History:  The patient's family history includes Atrial fibrillation in his father; Heart attack in his paternal grandfather; Heart failure in his father.    ROS: All other systems are reviewed and negative. Unless otherwise mentioned in H&P    PHYSICAL EXAM: VS:  BP (!) 148/88   Pulse 80   Wt 228 lb 12 oz (103.8 kg)   SpO2 97%   BMI 31.02 kg/m  , BMI Body mass index is 31.02 kg/m. GEN: Well nourished, well developed, in no acute distress  HEENT: normal  Neck: no JVD, carotid bruits, or  masses Cardiac: RRR; Distant heart sounds, no murmurs, rubs, or gallops,no edema wearing cardiac monitor. Respiratory:  clear to auscultation bilaterally, normal work of breathing GI: soft, nontender, nondistended, + BS MS: no deformity or atrophy  Skin: warm and dry, no rash Neuro:  Strength and sensation are intact Psych: euthymic mood, full affect   Recent Labs: 12/18/2015: Magnesium 1.9 04/02/2016: ALT 21; B Natriuretic Peptide 393.4; TSH 5.527 04/03/2016: BUN 18; Creatinine, Ser 1.21; Hemoglobin 15.2; Platelets 212; Potassium 3.5; Sodium 136    Lipid Panel    Component Value Date/Time   CHOL 93 01/05/2016 0807   TRIG 65 01/05/2016 0807   HDL 25 (L) 01/05/2016 0807   CHOLHDL 3.7 01/05/2016 0807   VLDL 13 01/05/2016 0807   LDLCALC 55 01/05/2016 0807      Wt Readings from Last 3 Encounters:  05/24/16 228 lb 12 oz (103.8 kg)  05/09/16 225 lb 8 oz (102.3 kg)  04/26/16 216 lb (98 kg)       ASSESSMENT AND PLAN:  1. Coronary artery disease:  -- Status post coronary artery bypass grafting 6/17. Doing well. No ischemia --  Continue ASA and statin..  2. Chronic systolic CHF with systolic dysfunction:  -- Ischemic CM EF 30-35% with EF 10/17 --NYHA I-II. Volume status ok --Continue carvedilol, spiro and lasix. --Switch lisinopril to Entrest 24/26 after 36 hour washout  3. ? PAF --No arrhythmias on monitor   3. Hypertension: Currently blood pressure is well-controlled. Will not make any changes in his medication at this time. He will continue furosemide lisinopril and carvedilol.   Disposition:  Doing well. Will switch lisinopril to Entresto. Will FU with Dr. Bronson Ing in [place] to continue to titrate HF meds. Will need repeat echo in several months. If EF still <= 35% need to consider to ICD.   The HF Clinic will be available as needed for further recommendations.   Diya Gervasi,MD 12:05 PM

## 2016-05-30 ENCOUNTER — Telehealth: Payer: Self-pay

## 2016-05-30 ENCOUNTER — Encounter: Payer: Self-pay | Admitting: Orthopedic Surgery

## 2016-05-30 ENCOUNTER — Ambulatory Visit (INDEPENDENT_AMBULATORY_CARE_PROVIDER_SITE_OTHER): Payer: 59 | Admitting: Orthopedic Surgery

## 2016-05-30 VITALS — BP 143/89 | HR 85 | Wt 227.0 lb

## 2016-05-30 DIAGNOSIS — M6740 Ganglion, unspecified site: Secondary | ICD-10-CM

## 2016-05-30 NOTE — Patient Instructions (Addendum)
Ganglion Cyst Introduction A ganglion cyst is a noncancerous, fluid-filled lump that occurs near joints or tendons. The ganglion cyst grows out of a joint or the lining of a tendon. It most often develops in the hand or wrist, but it can also develop in the shoulder, elbow, hip, knee, ankle, or foot. The round or oval ganglion cyst can be the size of a pea or larger than a grape. Increased activity may enlarge the size of the cyst because more fluid starts to build up. What are the causes? It is not known what causes a ganglion cyst to grow. However, it may be related to:  Inflammation or irritation around the joint.  An injury.  Repetitive movements or overuse.  Arthritis. What increases the risk? Risk factors include:  Being a woman.  Being age 58-50. What are the signs or symptoms? Symptoms may include:  A lump. This most often appears on the hand or wrist, but it can occur in other areas of the body.  Tingling.  Pain.  Numbness.  Muscle weakness.  Weak grip.  Less movement in a joint. How is this diagnosed? Ganglion cysts are most often diagnosed based on a physical exam. Your health care provider will feel the lump and may shine a light alongside it. If it is a ganglion cyst, a light often shines through it. Your health care provider may order an X-ray, ultrasound, or MRI to rule out other conditions. How is this treated? Ganglion cysts usually go away on their own without treatment. If pain or other symptoms are involved, treatment may be needed. Treatment is also needed if the ganglion cyst limits your movement or if it gets infected. Treatment may include:  Wearing a brace or splint on your wrist or finger.  Taking anti-inflammatory medicine.  Draining fluid from the lump with a needle (aspiration).  Injecting a steroid into the joint.  Surgery to remove the ganglion cyst. Follow these instructions at home:  Do not press on the ganglion cyst, poke it with a  needle, or hit it.  Take medicines only as directed by your health care provider.  Wear your brace or splint as directed by your health care provider.  Watch your ganglion cyst for any changes.  Keep all follow-up visits as directed by your health care provider. This is important. Contact a health care provider if:  Your ganglion cyst becomes larger or more painful.  You have increased redness, red streaks, or swelling.  You have pus coming from the lump.  You have weakness or numbness in the affected area.  You have a fever or chills. This information is not intended to replace advice given to you by your health care provider. Make sure you discuss any questions you have with your health care provider. Document Released: 06/16/2000 Document Revised: 11/25/2015 Document Reviewed: 12/02/2013  2017 Elsevier

## 2016-05-30 NOTE — Telephone Encounter (Signed)
Spoke to patient about changing the procedure to the hospital. The next available 1/2 day is on 07/07/2016. Patient states that if he cannot get this done before the end of the year then he will have to cancel. I told the patient that if something opens up or if there is another option I would let him know.

## 2016-05-30 NOTE — Telephone Encounter (Signed)
-----   Message from Manus Gunning, MD sent at 05/30/2016  7:43 AM EST ----- Regarding: RE: ASA IV pt Hi Almyra Free, I just saw this done from Osvaldo Angst. This patient is scheduled for Dec 1st but due to his EF he cannot be done at the [facility]. His case will need to be cancelled and rescheduled to be done at the hospital at my next available 1/2 day opening.  Can you help with coordinating this? Thanks much  ----- Message ----- From: Osvaldo Angst, CRNA Sent: 05/28/2016   7:45 PM To: Manus Gunning, MD Subject: ASA IV pt                                      Doc,  This pt's cardiac echo in October indicates his EF is 30-35%; consequently he does not qualify for anesthetic care at [facility].  Thanks,  Osvaldo Angst

## 2016-05-30 NOTE — Progress Notes (Signed)
Patient ID: [name] [name], male   DOB: [birth year], 55 y.o.   MRN: [id number]  Chief Complaint  Patient presents with  . Ganglion Cyst    bilateral wrists    HPI [name] [name] is a 55 y.o. male.  Presents for evaluation of possible ganglion cyst left and right wrist  He reports on the left side and a small nodule which eventually became larger appeared to be bilobular but went down on its own and then on the right side he has a firm nodule on the volar aspect of the wrist as well proximal 1 x 1 cm.  He reports no pain  Review of Systems Review of Systems 1. No current chest pain 2. No shortness of breath   Past Medical History:  Diagnosis Date  . Acute on chronic combined systolic and diastolic CHF, NYHA class 4 ([place]) 04/02/2016  . Acute systolic congestive heart failure ([place])   . Atrial fibrillation with rapid ventricular response ([place]) 12/13/2015  . Cardiomyopathy ([place]) 12/13/2015  . Cardiomyopathy, ischemic 12/14/2015   EF 20-25%  . Coronary artery disease involving native coronary artery with unstable angina pectoris ([place]) 12/15/2015  . GERD (gastroesophageal reflux disease)    pt. denies  . Hypercholesterolemia 12/15/2015  . Mitral regurgitation 12/14/2015   moderate  . NSTEMI (non-ST elevated myocardial infarction) ([place])   . PONV (postoperative nausea and vomiting)    "sick with very first surgery but nothing since"  . 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 Tobacco abuse     Past Surgical History:  Procedure Laterality Date  . CARDIAC CATHETERIZATION N/A 12/15/2015   Procedure: Right/Left Heart Cath and Coronary Angiography;  Surgeon: Wellington Hampshire, MD;  Location: [facility];  Service: Cardiovascular;  Laterality: N/A;  . CLIPPING OF ATRIAL APPENDAGE Left 12/17/2015   Procedure: CLIPPING OF ATRIAL APPENDAGE;  Surgeon: [name], MD;  Location: [facility];   Service: Open Heart Surgery;  Laterality: Left;  . CORONARY ARTERY BYPASS GRAFT N/A 12/17/2015   Procedure: CORONARY ARTERY BYPASS GRAFTING (CABG) x three , using left internal mammary artery and right leg greater saphenous vein;  Surgeon: [name], MD;  Location: [facility];  Service: Open Heart Surgery;  Laterality: N/A;  . ENDARTERECTOMY Left 02/09/2016   Procedure: LEFT CAROTID ENDARTERECTOMY;  Surgeon: Serafina Mitchell, MD;  Location: [facility];  Service: Vascular;  Laterality: Left;  . KNEE ARTHROSCOPY Right 1991  . PATCH ANGIOPLASTY Left 02/09/2016   Procedure: PATCH ANGIOPLASTY USING Rueben Bash BIOLOGIC PATCH;  Surgeon: Serafina Mitchell, MD;  Location: [facility] OR;  Service: Vascular;  Laterality: Left;    Social History Social History  Substance Use Topics  . Smoking status: Former Smoker    Packs/day: 1.00    Years: 35.00    Types: Cigarettes    Quit date: 12/13/2015  . Smokeless tobacco: Former Systems developer    Quit date: 12/13/2015  . Alcohol use 0.0 oz/week     Comment: rare    Allergies  Allergen Reactions  . No Known Allergies Other (See Comments)    No outpatient prescriptions have been marked as taking for the 05/30/16 encounter (Appointment) with Carole Civil, MD.      Physical Exam Physical Exam There were no vitals taken for this visit.  Gen. appearance. The patient is well-developed and well-nourished, grooming  and hygiene are normal. There are no gross congenital abnormalities  The patient is alert and oriented to person place and time  Mood and affect are normal    Examination reveals the following: On inspection we find Left wrist no masses noted full range of motion Normal radial artery palpation  On the right wrist he has a firm one by one nodular mass consistent with a ganglion with a palpable radial artery normal Allen test bilaterally  With the range of motion of  normal in each wrist  Data Reviewed   Assessment    Encounter Diagnosis  Name Primary?   . Ganglion cyst Yes       Plan    Observation       Arther Abbott 05/30/2016, 3:59 PM

## 2016-05-30 NOTE — Telephone Encounter (Signed)
[name] [name] I will try to add him on outside of the normal schedule. Can he do 730 AM on Dec 20th? Does the hospital have opening for cases that day? Thanks

## 2016-05-31 ENCOUNTER — Other Ambulatory Visit (HOSPITAL_COMMUNITY): Payer: 59

## 2016-06-01 ENCOUNTER — Telehealth: Payer: Self-pay | Admitting: Gastroenterology

## 2016-06-01 ENCOUNTER — Other Ambulatory Visit: Payer: Self-pay

## 2016-06-01 DIAGNOSIS — Z1211 Encounter for screening for malignant neoplasm of colon: Secondary | ICD-10-CM

## 2016-06-01 NOTE — Progress Notes (Signed)
New prep instructions mailed to patient.

## 2016-06-01 NOTE — Telephone Encounter (Signed)
Patient is now scheduled for [facility] on 12/20 at 1:30 for his colonoscopy. Wife aware of date/time. Informed that I would be sending out new prep instructions with new location/date/time. Wife was very appreciative of getting him scheduled within this year.

## 2016-06-01 NOTE — Progress Notes (Signed)
a 

## 2016-06-02 ENCOUNTER — Encounter: Payer: 59 | Admitting: Gastroenterology

## 2016-06-08 ENCOUNTER — Telehealth (HOSPITAL_COMMUNITY): Payer: Self-pay | Admitting: Pharmacist

## 2016-06-08 NOTE — Telephone Encounter (Signed)
[employer] will only cover Viagra #6 tablets per 30 day supply. Attempted PA for larger quantity but this was denied.   Ruta Hinds. Velva Harman, PharmD, BCPS, CPP Clinical Pharmacist Pager: [phone] Phone: [phone] 06/08/2016 4:21 PM

## 2016-06-16 ENCOUNTER — Encounter: Payer: Self-pay | Admitting: Cardiovascular Disease

## 2016-06-16 ENCOUNTER — Ambulatory Visit (INDEPENDENT_AMBULATORY_CARE_PROVIDER_SITE_OTHER): Payer: 59 | Admitting: Cardiovascular Disease

## 2016-06-16 VITALS — BP 102/60 | HR 59 | Ht 72.0 in | Wt 232.0 lb

## 2016-06-16 DIAGNOSIS — Z951 Presence of aortocoronary bypass graft: Secondary | ICD-10-CM

## 2016-06-16 DIAGNOSIS — I255 Ischemic cardiomyopathy: Secondary | ICD-10-CM | POA: Diagnosis not present

## 2016-06-16 DIAGNOSIS — I10 Essential (primary) hypertension: Secondary | ICD-10-CM

## 2016-06-16 DIAGNOSIS — I5022 Chronic systolic (congestive) heart failure: Secondary | ICD-10-CM

## 2016-06-16 DIAGNOSIS — I25708 Atherosclerosis of coronary artery bypass graft(s), unspecified, with other forms of angina pectoris: Secondary | ICD-10-CM

## 2016-06-16 DIAGNOSIS — I739 Peripheral vascular disease, unspecified: Secondary | ICD-10-CM

## 2016-06-16 DIAGNOSIS — I5042 Chronic combined systolic (congestive) and diastolic (congestive) heart failure: Secondary | ICD-10-CM | POA: Diagnosis not present

## 2016-06-16 DIAGNOSIS — I519 Heart disease, unspecified: Secondary | ICD-10-CM

## 2016-06-16 NOTE — Patient Instructions (Signed)
Your physician recommends that you schedule a follow-up appointment in: 4 months Dr Bronson Ing     Your physician has requested that you have an echocardiogram (GET ECHO 1 WEEK BEFORE NEXT VISIT). Echocardiography is a painless test that uses sound waves to create images of your heart. It provides your doctor with information about the size and shape of your heart and how well your heart's chambers and valves are working. This procedure takes approximately one hour. There are no restrictions for this procedure.   Your physician recommends that you continue on your current medications as directed. Please refer to the Current Medication list given to you today.     Thank you for choosing [facility] !

## 2016-06-16 NOTE — Progress Notes (Signed)
SUBJECTIVE: The patient presents for follow-up of coronary artery disease status post three-vessel CABG on 12/17/15. He has chronic combined systolic and diastolic heart failure. He also underwent left carotid endarterectomy. Echocardiogram 04/03/16: Severely reduced left ventricle systolic function, LVEF [id number], grade 2 diastolic dysfunction.  The patient denies any symptoms of chest pain, palpitations, shortness of breath, lightheadedness, dizziness, leg swelling, orthopnea, PND, and syncope.  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    Review of Systems: As per "subjective", otherwise negative.  Allergies  Allergen Reactions  . No Known Allergies Other (See Comments)    Current Outpatient Prescriptions  Medication Sig Dispense Refill  . aspirin 81 MG EC tablet Take 1 tablet (81 mg total) by mouth daily. 30 tablet   . atorvastatin (LIPITOR) 80 MG tablet Take 1 tablet (80 mg total) by mouth daily at 6 PM. 90 tablet 1  . carvedilol (COREG) 6.25 MG tablet Take 1 tablet (6.25 mg total) by mouth 2 (two) times daily with a meal. 180 tablet 3  . furosemide (LASIX) 40 MG tablet Take 0.5 tablets (20 mg total) by mouth daily. 45 tablet 3  . sacubitril-valsartan (ENTRESTO) 24-26 MG Take 1 tablet by mouth 2 (two) times daily. 60 tablet 3  . sildenafil (VIAGRA) 100 MG tablet Take 1 tablet (100 mg total) by mouth daily as needed for erectile dysfunction. 10 tablet 2  . spironolactone (ALDACTONE) 25 MG tablet Take 0.5 tablets (12.5 mg total) by mouth daily. 45 tablet 3   No current facility-administered medications for this visit.     Past Medical History:  Diagnosis Date  . Acute on chronic combined systolic and diastolic CHF, NYHA class 4 ([place]) 04/02/2016  . Acute systolic congestive heart failure ([place])   . Atrial fibrillation with rapid ventricular response ([place]) 12/13/2015  . Cardiomyopathy ([place]) 12/13/2015  . Cardiomyopathy, ischemic 12/14/2015   EF  20-25%  . Coronary artery disease involving native coronary artery with unstable angina pectoris ([place]) 12/15/2015  . GERD (gastroesophageal reflux disease)    pt. denies  . Hypercholesterolemia 12/15/2015  . Mitral regurgitation 12/14/2015   moderate  . NSTEMI (non-ST elevated myocardial infarction) ([place])   . PONV (postoperative nausea and vomiting)    "sick with very first surgery but nothing since"  . 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 Tobacco abuse     Past Surgical History:  Procedure Laterality Date  . CARDIAC CATHETERIZATION N/A 12/15/2015   Procedure: Right/Left Heart Cath and Coronary Angiography;  Surgeon: Wellington Hampshire, MD;  Location: [facility];  Service: Cardiovascular;  Laterality: N/A;  . CLIPPING OF ATRIAL APPENDAGE Left 12/17/2015   Procedure: CLIPPING OF ATRIAL APPENDAGE;  Surgeon: [name], MD;  Location: [facility];  Service: Open Heart Surgery;  Laterality: Left;  . CORONARY ARTERY BYPASS GRAFT N/A 12/17/2015   Procedure: CORONARY ARTERY BYPASS GRAFTING (CABG) x three , using left internal mammary artery and right leg greater saphenous vein;  Surgeon: [name], MD;  Location: [facility];  Service: Open Heart Surgery;  Laterality: N/A;  . ENDARTERECTOMY Left 02/09/2016   Procedure: LEFT CAROTID ENDARTERECTOMY;  Surgeon: Serafina Mitchell, MD;  Location: [facility];  Service: Vascular;  Laterality: Left;  . KNEE ARTHROSCOPY Right 1991  . PATCH ANGIOPLASTY Left 02/09/2016  Procedure: PATCH ANGIOPLASTY USING Rueben Bash BIOLOGIC PATCH;  Surgeon: Serafina Mitchell, MD;  Location: [facility];  Service: Vascular;  Laterality: Left;    Social History   Social History  . Marital status: Married    Spouse name: N/A  . Number of children: 2  . Years of education: N/A   Occupational History  . [employer]aint[employer]    [employer]   Social History Main Topics  . Smoking  status: Former Smoker    Packs/day: 1.00    Years: 35.00    Types: Cigarettes    Quit date: 12/13/2015  . Smokeless tobacco: Former Systems developer    Quit date: 12/13/2015  . Alcohol use 0.0 oz/week     Comment: rare  . Drug use: No  . Sexual activity: Yes   Other Topics Concern  . Not on file   Social History Narrative  . No narrative on file     Vitals:   06/16/16 0824  BP: 102/60  Pulse: (!) 59  SpO2: 98%  Weight: 232 lb (105.2 kg)  Height: 6' (1.829 m)    PHYSICAL EXAM General: NAD HEENT: Normal. Neck: No JVD, no thyromegaly. Lungs: Diminished throughout, no rales/wheezes. CV: Nondisplaced PMI.  Regular rate and rhythm, normal S1/S2, no S3/S4, no murmur. No pretibial or periankle edema.    Abdomen: Soft, nontender, no distention.  Neurologic: Alert and oriented.  Psych: Normal affect. Skin: Normal. Musculoskeletal: No gross deformities.    ECG: Most recent ECG reviewed.      ASSESSMENT AND PLAN:  1. CAD/CABG: Symptomatically stable. No changes to medical therapy which included aspirin, carvedilol, Lipitor, and ARB.  2. Chronic systolic heart failure: He appears to be euvolemic and stable from this standpoint. Continue carvedilol, Lasix, spironolactone, and Entresto. Due to low normal blood pressure, I am unable to increase dose of Entresto.  3. Essential HTN: Controlled. No changes.  4. PVD s/p left carotid endarterectomy: Continue ASA and statin.   Dispo: fu 4 months with echocardiogram one week before appt.  Kate Sable, M.D., F.A.C.C.

## 2016-06-20 ENCOUNTER — Encounter (HOSPITAL_COMMUNITY): Payer: Self-pay

## 2016-06-21 ENCOUNTER — Ambulatory Visit (HOSPITAL_COMMUNITY): Payer: 59 | Admitting: Anesthesiology

## 2016-06-21 ENCOUNTER — Encounter (HOSPITAL_COMMUNITY)
Admission: RE | Disposition: A | Discharge status: Home / Self Care | Payer: Self-pay | Source: Ambulatory Visit | Attending: Gastroenterology

## 2016-06-21 ENCOUNTER — Encounter (HOSPITAL_COMMUNITY): Payer: Self-pay

## 2016-06-21 ENCOUNTER — Ambulatory Visit (HOSPITAL_COMMUNITY)
Admission: RE | Admit: 2016-06-21 | Discharge: 2016-06-21 | Disposition: A | Discharge status: Home / Self Care | Payer: 59 | Source: Ambulatory Visit | Attending: Gastroenterology | Admitting: Gastroenterology

## 2016-06-21 DIAGNOSIS — I2510 Atherosclerotic heart disease of native coronary artery without angina pectoris: Secondary | ICD-10-CM | POA: Insufficient documentation

## 2016-06-21 DIAGNOSIS — D125 Benign neoplasm of sigmoid colon: Secondary | ICD-10-CM | POA: Insufficient documentation

## 2016-06-21 DIAGNOSIS — Z951 Presence of aortocoronary bypass graft: Secondary | ICD-10-CM | POA: Diagnosis not present

## 2016-06-21 DIAGNOSIS — K635 Polyp of colon: Secondary | ICD-10-CM

## 2016-06-21 DIAGNOSIS — E7800 Pure hypercholesterolemia, unspecified: Secondary | ICD-10-CM | POA: Diagnosis not present

## 2016-06-21 DIAGNOSIS — D120 Benign neoplasm of cecum: Secondary | ICD-10-CM | POA: Insufficient documentation

## 2016-06-21 DIAGNOSIS — D123 Benign neoplasm of transverse colon: Secondary | ICD-10-CM

## 2016-06-21 DIAGNOSIS — I5042 Chronic combined systolic (congestive) and diastolic (congestive) heart failure: Secondary | ICD-10-CM | POA: Diagnosis not present

## 2016-06-21 DIAGNOSIS — Z79899 Other long term (current) drug therapy: Secondary | ICD-10-CM | POA: Diagnosis not present

## 2016-06-21 DIAGNOSIS — K5730 Diverticulosis of large intestine without perforation or abscess without bleeding: Secondary | ICD-10-CM | POA: Insufficient documentation

## 2016-06-21 DIAGNOSIS — Z1211 Encounter for screening for malignant neoplasm of colon: Secondary | ICD-10-CM | POA: Diagnosis not present

## 2016-06-21 DIAGNOSIS — I4891 Unspecified atrial fibrillation: Secondary | ICD-10-CM | POA: Diagnosis not present

## 2016-06-21 DIAGNOSIS — I110 Hypertensive heart disease with heart failure: Secondary | ICD-10-CM | POA: Diagnosis not present

## 2016-06-21 DIAGNOSIS — Z87891 Personal history of nicotine dependence: Secondary | ICD-10-CM | POA: Insufficient documentation

## 2016-06-21 DIAGNOSIS — I340 Nonrheumatic mitral (valve) insufficiency: Secondary | ICD-10-CM | POA: Diagnosis not present

## 2016-06-21 DIAGNOSIS — I255 Ischemic cardiomyopathy: Secondary | ICD-10-CM | POA: Diagnosis not present

## 2016-06-21 DIAGNOSIS — D127 Benign neoplasm of rectosigmoid junction: Secondary | ICD-10-CM | POA: Diagnosis not present

## 2016-06-21 DIAGNOSIS — D124 Benign neoplasm of descending colon: Secondary | ICD-10-CM | POA: Diagnosis not present

## 2016-06-21 DIAGNOSIS — Z1212 Encounter for screening for malignant neoplasm of rectum: Secondary | ICD-10-CM

## 2016-06-21 DIAGNOSIS — I739 Peripheral vascular disease, unspecified: Secondary | ICD-10-CM | POA: Insufficient documentation

## 2016-06-21 DIAGNOSIS — D128 Benign neoplasm of rectum: Secondary | ICD-10-CM

## 2016-06-21 DIAGNOSIS — I252 Old myocardial infarction: Secondary | ICD-10-CM | POA: Diagnosis not present

## 2016-06-21 HISTORY — PX: COLONOSCOPY WITH PROPOFOL: SHX5780

## 2016-06-21 SURGERY — COLONOSCOPY WITH PROPOFOL
Anesthesia: Monitor Anesthesia Care

## 2016-06-21 MED ORDER — LACTATED RINGERS IV SOLN
INTRAVENOUS | Status: DC
  Administered 2016-06-21: 13:00:00 via INTRAVENOUS

## 2016-06-21 MED ORDER — PROPOFOL 10 MG/ML IV BOLUS
INTRAVENOUS | Status: AC
  Filled 2016-06-21: qty 20

## 2016-06-21 MED ORDER — PROPOFOL 10 MG/ML IV BOLUS
INTRAVENOUS | Status: DC | PRN
  Administered 2016-06-21: 10 mg via INTRAVENOUS
  Administered 2016-06-21 (×9): 20 mg via INTRAVENOUS
  Administered 2016-06-21 (×2): 10 mg via INTRAVENOUS
  Administered 2016-06-21 (×3): 20 mg via INTRAVENOUS
  Administered 2016-06-21: 30 mg via INTRAVENOUS
  Administered 2016-06-21 (×2): 20 mg via INTRAVENOUS
  Administered 2016-06-21: 30 mg via INTRAVENOUS
  Administered 2016-06-21 (×9): 20 mg via INTRAVENOUS
  Administered 2016-06-21: 30 mg via INTRAVENOUS
  Administered 2016-06-21 (×6): 20 mg via INTRAVENOUS

## 2016-06-21 SURGICAL SUPPLY — 21 items

## 2016-06-21 NOTE — Op Note (Signed)
[facility] Patient Name: [name] [name] Procedure Date: 06/21/2016 MRN: [id number] Attending MD: Carlota Raspberry. Breda Bond MD, MD Date of Birth: 1960/09/18 CSN: [id number] Age: 55 Admit Type: Outpatient Procedure:                Colonoscopy Indications:              Screening for colorectal malignant neoplasm, This                            is the patient's first colonoscopy Providers:                Carlota Raspberry. Ara Grandmaison MD, MD, Carolynn Comment, RN,                            Elspeth Cho Tech., Technician, Derrek Gu. Alday                            CRNA, CRNA Referring MD:              Medicines:                Monitored Anesthesia Care Complications:            No immediate complications. Estimated blood loss:                            Minimal. Estimated Blood Loss:     Estimated blood loss was minimal. Procedure:                Pre-Anesthesia Assessment:                           - Prior to the procedure, a History and Physical                            was performed, and patient medications and                            allergies were reviewed. The patient's tolerance of                            previous anesthesia was also reviewed. The risks                            and benefits of the procedure and the sedation                            options and risks were discussed with the patient.                            All questions were answered, and informed consent                            was obtained. Prior Anticoagulants: The patient has                            taken no  previous anticoagulant or antiplatelet                            agents. ASA Grade Assessment: III - A patient with                            severe systemic disease. After reviewing the risks                            and benefits, the patient was deemed in                            satisfactory condition to undergo the procedure.                           After obtaining  informed consent, the colonoscope                            was passed under direct vision. Throughout the                            procedure, the patient's blood pressure, pulse, and                            oxygen saturations were monitored continuously. The                            [id number]) scope was introduced through                            the anus and advanced to the the cecum, identified                            by appendiceal orifice and ileocecal valve. The                            colonoscopy was performed without difficulty. The                            patient tolerated the procedure well. The quality                            of the bowel preparation was adequate. The                            ileocecal valve, appendiceal orifice, and rectum                            were photographed. Scope In: 1:39:07 PM Scope Out: 2:19:13 PM Scope Withdrawal Time: 0 hours 36 minutes 24 seconds  Total Procedure Duration: 0 hours 40 minutes 6 seconds  Findings:      The perianal and digital rectal examinations were normal.      A 6 mm polyp was found in the ileocecal valve. The polyp  was sessile.       The polyp was removed with a cold snare. Resection and retrieval were       complete.      A 6 mm polyp was found in the descending colon. The polyp was sessile.       The polyp was removed with a cold snare. Resection and retrieval were       complete.      Five sessile polyps were found in the sigmoid colon. The polyps were 3       to 7 mm in size. These polyps were removed with a cold snare. Resection       and retrieval were complete.      A 10 mm polyp was found in the sigmoid colon. The polyp was       semi-pedunculated. The polyp was removed with a hot snare. Resection and       retrieval were complete.      A 10 mm polyp was found in the recto-sigmoid colon. The polyp was       pedunculated and grossly had the appearance of a lipoma or an        inflammatory polyp. The polyp was removed with a hot snare. Resection       and retrieval were complete.      A 6 mm polyp was found in the rectum. The polyp was semi-pedunculated.       The polyp was removed with a hot snare. Resection and retrieval were       complete.      A 4 mm polyp was found in the rectum. The polyp was sessile. The polyp       was removed with a cold snare. Resection and retrieval were complete.      Many medium-mouthed diverticula were found in the left colon.      The exam was otherwise without abnormality on direct and retroflexion       views. Impression:               - One 6 mm polyp at the ileocecal valve, removed                            with a cold snare. Resected and retrieved.                           - One 6 mm polyp in the descending colon, removed                            with a cold snare. Resected and retrieved.                           - Five 3 to 7 mm polyps in the sigmoid colon,                            removed with a cold snare. Resected and retrieved.                           - One 10 mm polyp in the sigmoid colon, removed  with a hot snare. Resected and retrieved.                           - One 10 mm polyp at the recto-sigmoid colon,                            removed with a hot snare. Resected and retrieved.                           - One 6 mm polyp in the rectum, removed with a hot                            snare. Resected and retrieved.                           - One 4 mm polyp in the rectum, removed with a cold                            snare. Resected and retrieved.                           - Diverticulosis in the left colon.                           - The examination was otherwise normal on direct                            and retroflexion views. Moderate Sedation:      No moderate sedation, case performed with MAC Recommendation:           - Patient has a contact number available for                             emergencies. The signs and symptoms of potential                            delayed complications were discussed with the                            patient. Return to normal activities tomorrow.                            Written discharge instructions were provided to the                            patient.                           - Resume previous diet.                           - Continue present medications including aspirin                           - No ibuprofen, naproxen, or  other non-steroidal                            anti-inflammatory drugs for 2 weeks after polyp                            removal.                           - Await pathology results.                           - Repeat colonoscopy is recommended for                            surveillance. The colonoscopy date will be                            determined after pathology results from today's                            exam become available for review. Procedure Code(s):        --- Professional ---                           [phone], Colonoscopy, flexible; with removal of                            tumor(s), polyp(s), or other lesion(s) by snare                            technique Diagnosis Code(s):        --- Professional ---                           Z12.11, Encounter for screening for malignant                            neoplasm of colon                           D12.0, Benign neoplasm of cecum                           D12.4, Benign neoplasm of descending colon                           D12.5, Benign neoplasm of sigmoid colon                           D12.7, Benign neoplasm of rectosigmoid junction                           K62.1, Rectal polyp                           K57.30, Diverticulosis of large intestine without  perforation or abscess without bleeding CPT copyright 2016 American Medical Association. All rights reserved. The codes documented in this  report are preliminary and upon coder review may  be revised to meet current compliance requirements. Remo Lipps P. Lawrencia Mauney MD, MD 06/21/2016 2:29:06 PM This report has been signed electronically. Number of Addenda: 0

## 2016-06-21 NOTE — H&P (Signed)
HPI :  55 y/o male with significant cardiac history here to discuss colon cancer screening. He has a history of CHF, ischemic cardiomyopathy s/p CABG, atrial fibrillation with RVR, PVD with carotid artery stenosis s/p end arterectomy in August of this year. He was recently hospitalized in early October for what he reports was "flash pulmonary edema". Echo during his hospitalization showed EF 30-35%.   No prior colon cancer screening. No FH of colon cancer. No trouble with bowels. No blood in the stools. No constipation or diarrhea. No anemia. He has a significant tobacco history.   No changes to the patient's history since his last visit  Past Medical History:  Diagnosis Date  . Acute on chronic combined systolic and diastolic CHF, NYHA class 4 ([place]) 04/02/2016  . Acute systolic congestive heart failure ([place])   . Atrial fibrillation with rapid ventricular response ([place]) 12/13/2015   "had a heart monitor and can't find a source for the a. fib"  . Cardiomyopathy ([place]) 12/13/2015  . Cardiomyopathy, ischemic 12/14/2015   EF 20-25%  . Coronary artery disease involving native coronary artery with unstable angina pectoris ([place]) 12/15/2015  . Hypercholesterolemia 12/15/2015  . Mitral regurgitation 12/14/2015   moderate  . NSTEMI (non-ST elevated myocardial infarction) ([place])   . PONV (postoperative nausea and vomiting)    "sick with very first surgery but nothing since" in 1990s  . 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 Tobacco abuse      Past Surgical History:  Procedure Laterality Date  . CARDIAC CATHETERIZATION N/A 12/15/2015   Procedure: Right/Left Heart Cath and Coronary Angiography;  Surgeon: Wellington Hampshire, MD;  Location: [facility];  Service: Cardiovascular;  Laterality: N/A;  . CLIPPING OF ATRIAL APPENDAGE Left 12/17/2015   Procedure: CLIPPING OF ATRIAL APPENDAGE;   Surgeon: [name], MD;  Location: [facility];  Service: Open Heart Surgery;  Laterality: Left;  . CORONARY ARTERY BYPASS GRAFT N/A 12/17/2015   Procedure: CORONARY ARTERY BYPASS GRAFTING (CABG) x three , using left internal mammary artery and right leg greater saphenous vein;  Surgeon: [name], MD;  Location: [facility];  Service: Open Heart Surgery;  Laterality: N/A;  . ENDARTERECTOMY Left 02/09/2016   Procedure: LEFT CAROTID ENDARTERECTOMY;  Surgeon: Serafina Mitchell, MD;  Location: [facility];  Service: Vascular;  Laterality: Left;  . KNEE ARTHROSCOPY Right 1991  . PATCH ANGIOPLASTY Left 02/09/2016   Procedure: PATCH ANGIOPLASTY USING Rueben Bash BIOLOGIC PATCH;  Surgeon: Serafina Mitchell, MD;  Location: [facility] OR;  Service: Vascular;  Laterality: Left;   Family History  Problem Relation Age of Onset  . Heart attack Paternal Grandfather   . Heart failure Father   . Atrial fibrillation Father    Social History  Substance Use Topics  . Smoking status: Former Smoker    Packs/day: 1.00    Years: 35.00    Types: Cigarettes    Quit date: 12/13/2015  . Smokeless tobacco: Former Systems developer    Quit date: 12/13/2015  . Alcohol use 0.0 oz/week     Comment: rare   No current facility-administered medications for this encounter.    No Known Allergies   Review of Systems: All systems reviewed and negative except where noted in HPI.     Physical Exam: BP (!) 176/92   Pulse 66   Temp 97.7 F (  36.5 C) (Oral)   Resp 10   Ht 6' (1.829 m)   Wt 232 lb (105.2 kg)   SpO2 100%   BMI 31.46 kg/m  Constitutional: Pleasant,well-developed, male in no acute distress. Cardiovascular: Normal rate, regular rhythm.  Pulmonary/chest: Effort normal and breath sounds normal. No wheezing, rales or rhonchi. Abdominal: Soft, nondistended, nontenderThere are no masses palpable. No hepatomegaly. Extremities: no edema    ASSESSMENT AND PLAN: 55 year old male with cardiac history as outlined above, here for first time  colon cancer screening. I discussed risks and benefits of colonoscopy with him and he wished proceed. Further recommendations pending results of this exam.  Robinwood Cellar, MD [facility] Pager [phone]

## 2016-06-21 NOTE — Anesthesia Preprocedure Evaluation (Signed)
Anesthesia Evaluation  Patient identified by MRN, date of birth, ID band Patient awake    Reviewed: Allergy & Precautions, NPO status , Patient's Chart, lab work & pertinent test results  History of Anesthesia Complications (+) PONV  Airway Mallampati: I  TM Distance: >3 FB Neck ROM: Full    Dental   Pulmonary former smoker,    Pulmonary exam normal        Cardiovascular hypertension, Pt. on medications + CAD, + Past MI and + CABG  Normal cardiovascular exam     Neuro/Psych    GI/Hepatic   Endo/Other    Renal/GU      Musculoskeletal   Abdominal   Peds  Hematology   Anesthesia Other Findings   Reproductive/Obstetrics                             Anesthesia Physical Anesthesia Plan  ASA: III  Anesthesia Plan: MAC   Post-op Pain Management:    Induction: Intravenous  Airway Management Planned: Simple Face Mask  Additional Equipment:   Intra-op Plan:   Post-operative Plan:   Informed Consent: I have reviewed the patients History and Physical, chart, labs and discussed the procedure including the risks, benefits and alternatives for the proposed anesthesia with the patient or authorized representative who has indicated his/her understanding and acceptance.     Plan Discussed with: CRNA and Surgeon  Anesthesia Plan Comments:         Anesthesia Quick Evaluation

## 2016-06-21 NOTE — Interval H&P Note (Signed)
History and Physical Interval Note:  06/21/2016 1:14 PM  [name] [name]  has presented today for surgery, with the diagnosis of colon cancer screening  The various methods of treatment have been discussed with the patient and family. After consideration of risks, benefits and other options for treatment, the patient has consented to  Procedure(s): COLONOSCOPY WITH PROPOFOL (N/A) as a surgical intervention .  The patient's history has been reviewed, patient examined, no change in status, stable for surgery.  I have reviewed the patient's chart and labs.  Questions were answered to the patient's satisfaction.     [name]

## 2016-06-21 NOTE — Transfer of Care (Signed)
Immediate Anesthesia Transfer of Care Note  Patient: [name] [name]  Procedure(s) Performed: Procedure(s): COLONOSCOPY WITH PROPOFOL (N/A)  Patient Location: PACU  Anesthesia Type:MAC  Level of Consciousness: sedated  Airway & Oxygen Therapy: Patient Spontanous Breathing and Patient connected to nasal cannula oxygen  Post-op Assessment: Report given to RN and Post -op Vital signs reviewed and stable  Post vital signs: Reviewed and stable  Last Vitals:  Vitals:   06/21/16 1304  BP: (!) 176/92  Pulse: 66  Resp: 10  Temp: 36.5 C    Last Pain:  Vitals:   06/21/16 1304  TempSrc: Oral         Complications: No apparent anesthesia complications

## 2016-06-21 NOTE — Discharge Instructions (Signed)
YOU HAD AN ENDOSCOPIC PROCEDURE TODAY: Refer to the procedure report and other information in the discharge instructions given to you for any specific questions about what was found during the examination. If this information does not answer your questions, please call [place] office at [phone] to clarify.  ° °YOU SHOULD EXPECT: Some feelings of bloating in the abdomen. Passage of more gas than usual. Walking can help get rid of the air that was put into your GI tract during the procedure and reduce the bloating. If you had a lower endoscopy (such as a colonoscopy or flexible sigmoidoscopy) you may notice spotting of blood in your stool or on the toilet paper. Some abdominal soreness may be present for a day or two, also. ° °DIET: Your first meal following the procedure should be a light meal and then it is ok to progress to your normal diet. A half-sandwich or bowl of soup is an example of a good first meal. Heavy or fried foods are harder to digest and may make you feel nauseous or bloated. Drink plenty of fluids but you should avoid alcoholic beverages for 24 hours. If you had a esophageal dilation, please see attached instructions for diet.   ° °ACTIVITY: Your care partner should take you home directly after the procedure. You should plan to take it easy, moving slowly for the rest of the day. You can resume normal activity the day after the procedure however YOU SHOULD NOT DRIVE, use power tools, machinery or perform tasks that involve climbing or major physical exertion for 24 hours (because of the sedation medicines used during the test).  ° °SYMPTOMS TO REPORT IMMEDIATELY: °A gastroenterologist can be reached at any hour. Please call [phone]  for any of the following symptoms:  °Following lower endoscopy (colonoscopy, flexible sigmoidoscopy) °Excessive amounts of blood in the stool  °Significant tenderness, worsening of abdominal pains  °Swelling of the abdomen that is new, acute  °Fever of 100° or  higher  °Following upper endoscopy (EGD, EUS, ERCP, esophageal dilation) °Vomiting of blood or coffee ground material  °New, significant abdominal pain  °New, significant chest pain or pain under the shoulder blades  °Painful or persistently difficult swallowing  °New shortness of breath  °Black, tarry-looking or red, bloody stools ° °FOLLOW UP:  °If any biopsies were taken you will be contacted by phone or by letter within the next 1-3 weeks. Call [phone]  if you have not heard about the biopsies in 3 weeks.  °Please also call with any specific questions about appointments or follow up tests. ° °

## 2016-06-21 NOTE — Anesthesia Postprocedure Evaluation (Signed)
Anesthesia Post Note  Patient: [name] [name]  Procedure(s) Performed: Procedure(s) (LRB): COLONOSCOPY WITH PROPOFOL (N/A)  Patient location during evaluation: PACU Anesthesia Type: MAC Level of consciousness: awake and alert Pain management: pain level controlled Vital Signs Assessment: post-procedure vital signs reviewed and stable Respiratory status: spontaneous breathing, nonlabored ventilation, respiratory function stable and patient connected to nasal cannula oxygen Cardiovascular status: stable and blood pressure returned to baseline Anesthetic complications: no       Last Vitals:  Vitals:   06/21/16 1440 06/21/16 1450  BP: (!) 150/98 (!) 172/91  Pulse: 63 60  Resp: 11 13  Temp:      Last Pain:  Vitals:   06/21/16 1426  TempSrc: Oral                 Guadalupe Kerekes DAVID

## 2016-06-22 ENCOUNTER — Encounter: Payer: Self-pay | Admitting: Gastroenterology

## 2016-06-22 ENCOUNTER — Encounter (HOSPITAL_COMMUNITY): Payer: Self-pay | Admitting: Gastroenterology

## 2016-06-22 DIAGNOSIS — D225 Melanocytic nevi of trunk: Secondary | ICD-10-CM | POA: Diagnosis not present

## 2016-06-22 DIAGNOSIS — L918 Other hypertrophic disorders of the skin: Secondary | ICD-10-CM | POA: Diagnosis not present

## 2016-06-22 DIAGNOSIS — L821 Other seborrheic keratosis: Secondary | ICD-10-CM | POA: Diagnosis not present

## 2016-06-22 DIAGNOSIS — D485 Neoplasm of uncertain behavior of skin: Secondary | ICD-10-CM | POA: Diagnosis not present

## 2016-06-22 DIAGNOSIS — L814 Other melanin hyperpigmentation: Secondary | ICD-10-CM | POA: Diagnosis not present

## 2016-06-23 NOTE — Progress Notes (Signed)
Letter mailed

## 2016-06-27 ENCOUNTER — Other Ambulatory Visit: Payer: Self-pay | Admitting: Family Medicine

## 2016-06-27 ENCOUNTER — Telehealth: Payer: Self-pay | Admitting: Adult Health

## 2016-06-27 NOTE — Telephone Encounter (Signed)
Wt today with shoes off 228 lbs (at last visit 232 with shoes on)   Says he urinates less and wonders if lasix working,I showed him he has actually lost weight.watches  Low sodium diet

## 2016-06-27 NOTE — Telephone Encounter (Signed)
I would not base whether lasix is working or not based on how much he is urinating. Weight and signs of leg swelling are much better indicators. If his weight is down from last visit and no edema I would not change his lasix. If he has less fluid to begin with he will pee out less with lasix. He should monitor his home weights and legs for edema and update [place] if any change   Zandra Abts MD

## 2016-06-27 NOTE — Telephone Encounter (Signed)
Pt notified of dr Nelly Laurence message

## 2016-06-27 NOTE — Telephone Encounter (Signed)
Patient is on Lasix 40mg  taking 1/2 tab once a day. States that is not really working. Patient states that he tried taking the whole 40 mg and that is not working either. Wants to know if there is anything else he can take. He states he is afraid of fluid build up. Has not had any as of yet.  tg

## 2016-06-29 ENCOUNTER — Other Ambulatory Visit (HOSPITAL_COMMUNITY)
Admission: RE | Admit: 2016-06-29 | Discharge: 2016-06-29 | Disposition: A | Discharge status: Home / Self Care | Payer: 59 | Source: Ambulatory Visit | Attending: Physical Medicine and Rehabilitation | Admitting: Physical Medicine and Rehabilitation

## 2016-06-29 DIAGNOSIS — E038 Other specified hypothyroidism: Secondary | ICD-10-CM | POA: Diagnosis not present

## 2016-06-29 DIAGNOSIS — E559 Vitamin D deficiency, unspecified: Secondary | ICD-10-CM | POA: Insufficient documentation

## 2016-06-29 DIAGNOSIS — E539 Vitamin B deficiency, unspecified: Secondary | ICD-10-CM | POA: Insufficient documentation

## 2016-06-29 DIAGNOSIS — E663 Overweight: Secondary | ICD-10-CM | POA: Diagnosis not present

## 2016-06-29 DIAGNOSIS — E2749 Other adrenocortical insufficiency: Secondary | ICD-10-CM | POA: Diagnosis not present

## 2016-06-29 DIAGNOSIS — R972 Elevated prostate specific antigen [PSA]: Secondary | ICD-10-CM | POA: Diagnosis not present

## 2016-06-29 DIAGNOSIS — E291 Testicular hypofunction: Secondary | ICD-10-CM | POA: Insufficient documentation

## 2016-06-29 LAB — T4, FREE: FREE T4: 0.93 ng/dL (ref 0.61–1.12)

## 2016-06-29 LAB — VITAMIN B12: Vitamin B-12: 244 pg/mL (ref 180–914)

## 2016-06-29 LAB — PSA: PSA: 0.53 ng/mL (ref 0.00–4.00)

## 2016-06-29 LAB — TSH: TSH: 2.921 u[IU]/mL (ref 0.350–4.500)

## 2016-06-30 LAB — THYROID PEROXIDASE ANTIBODY: Thyroperoxidase Ab SerPl-aCnc: 11 IU/mL (ref 0–34)

## 2016-06-30 LAB — HOMOCYSTEINE: Homocysteine: 17 umol/L — ABNORMAL HIGH (ref 0.0–15.0)

## 2016-06-30 LAB — VITAMIN D 25 HYDROXY (VIT D DEFICIENCY, FRACTURES): VIT D 25 HYDROXY: 17.7 ng/mL — AB (ref 30.0–100.0)

## 2016-06-30 LAB — THYROGLOBULIN ANTIBODY: Thyroglobulin Antibody: <1 IU/mL (ref 0.0–0.9)

## 2016-06-30 LAB — PROGESTERONE: PROGESTERONE: 0.3 ng/mL (ref 0.0–0.5)

## 2016-06-30 LAB — ESTRADIOL: Estradiol: 35 pg/mL (ref 7.6–42.6)

## 2016-06-30 LAB — T3, FREE: T3 FREE: 4.2 pg/mL (ref 2.0–4.4)

## 2016-07-04 LAB — T3, REVERSE: T3, Reverse: 22 ng/dL (ref 8–25)

## 2016-07-04 LAB — TESTOSTERONE,FREE AND TOTAL
TESTOSTERONE FREE: 19.6 pg/mL (ref 7.2–24.0)
TESTOSTERONE: 676 ng/dL (ref 264–916)

## 2016-07-05 LAB — THYROGLOBULIN LEVEL: THYROGLOBULIN: 9.7 ng/mL

## 2016-08-04 ENCOUNTER — Telehealth: Payer: Self-pay | Admitting: Family Medicine

## 2016-08-04 DIAGNOSIS — Z79899 Other long term (current) drug therapy: Secondary | ICD-10-CM

## 2016-08-04 DIAGNOSIS — E785 Hyperlipidemia, unspecified: Secondary | ICD-10-CM

## 2016-08-04 IMAGING — DX DG CHEST 2V
2 series · 2 of 2 positions shown · non-contrast
Comparison: 12/21/2015

CLINICAL DATA: Coronary artery disease post NSTEMI and CABG x 3,
former smoker, atrial fibrillation post LEFT atrial appendage
clipping

EXAM:
CHEST  2 VIEW

[chest pa]
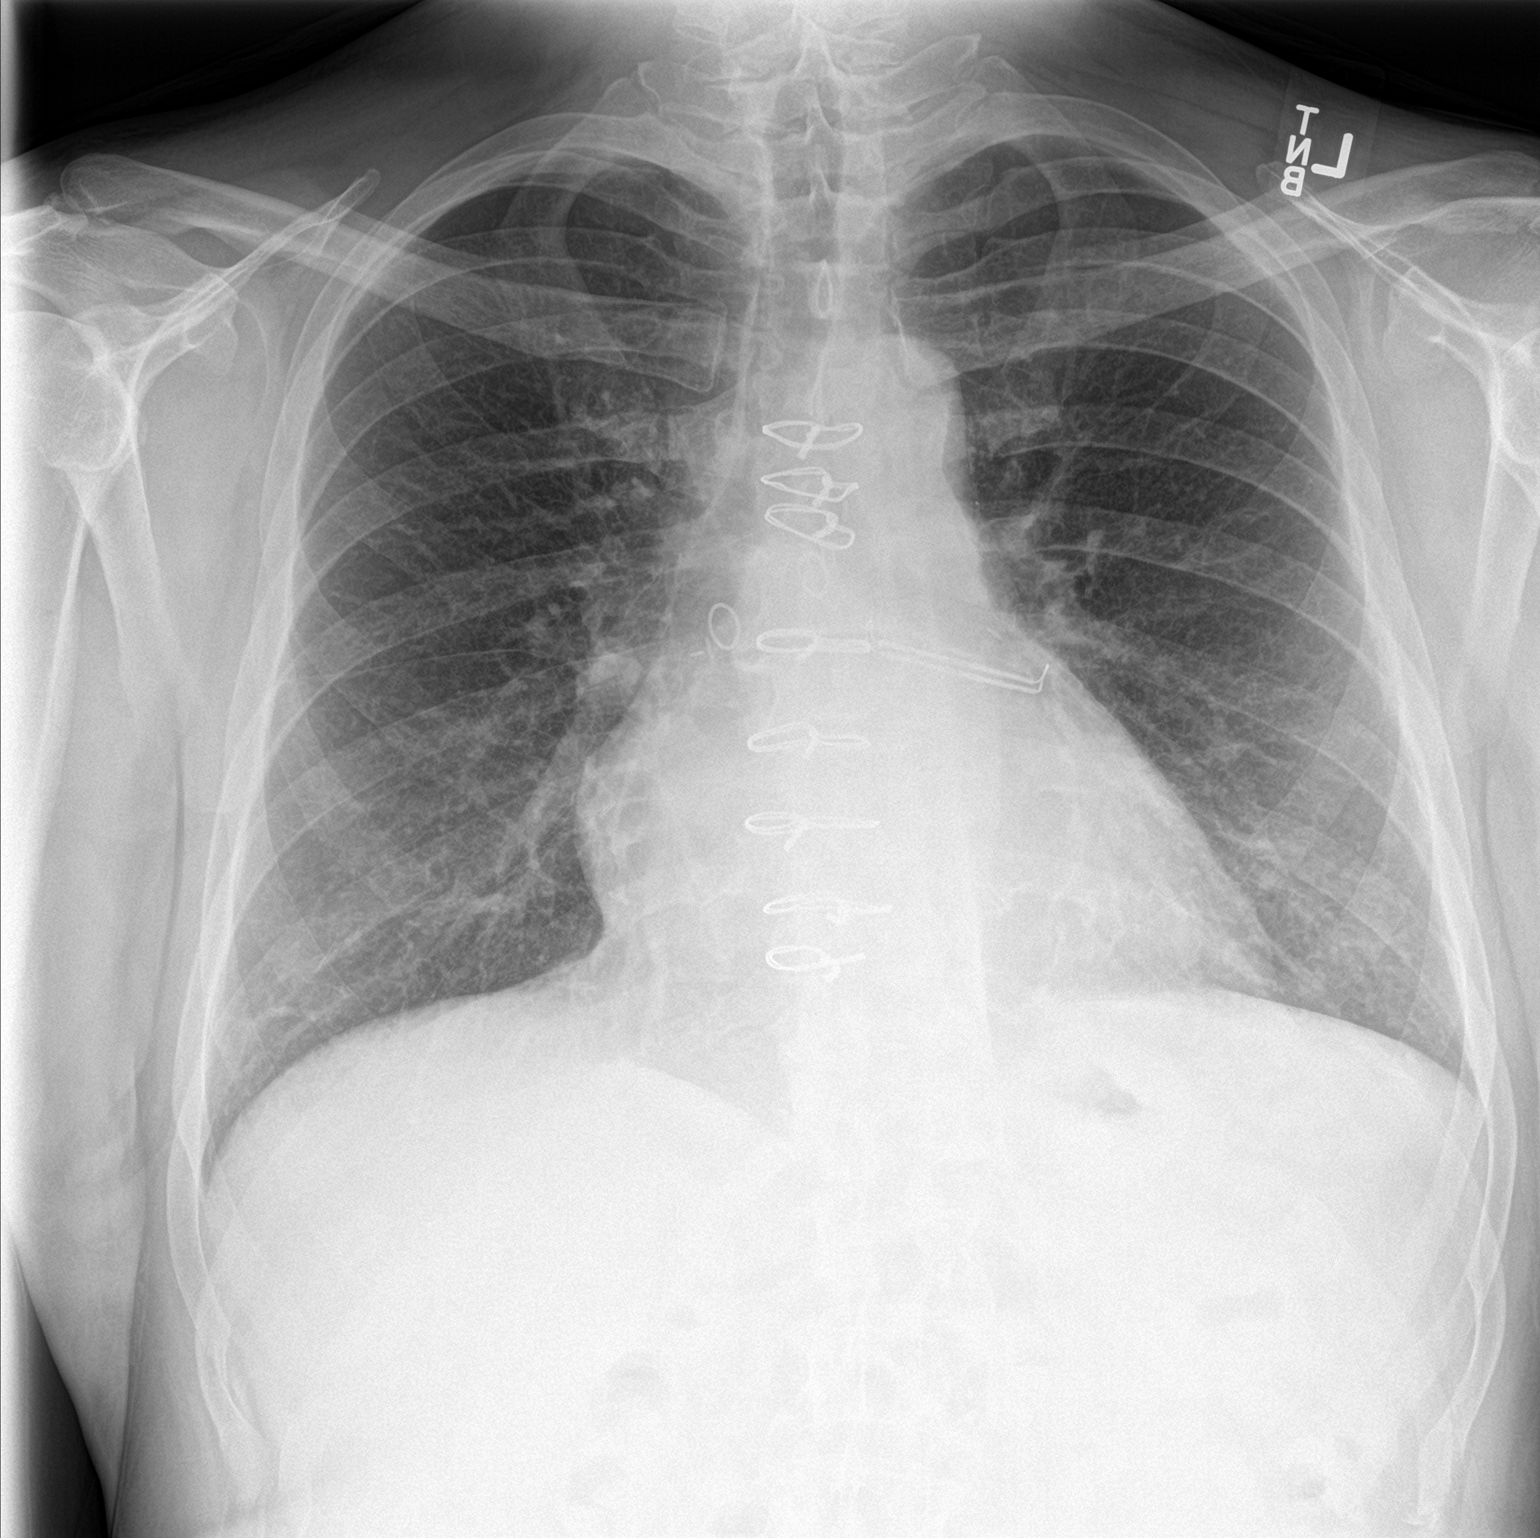

[chest lat]
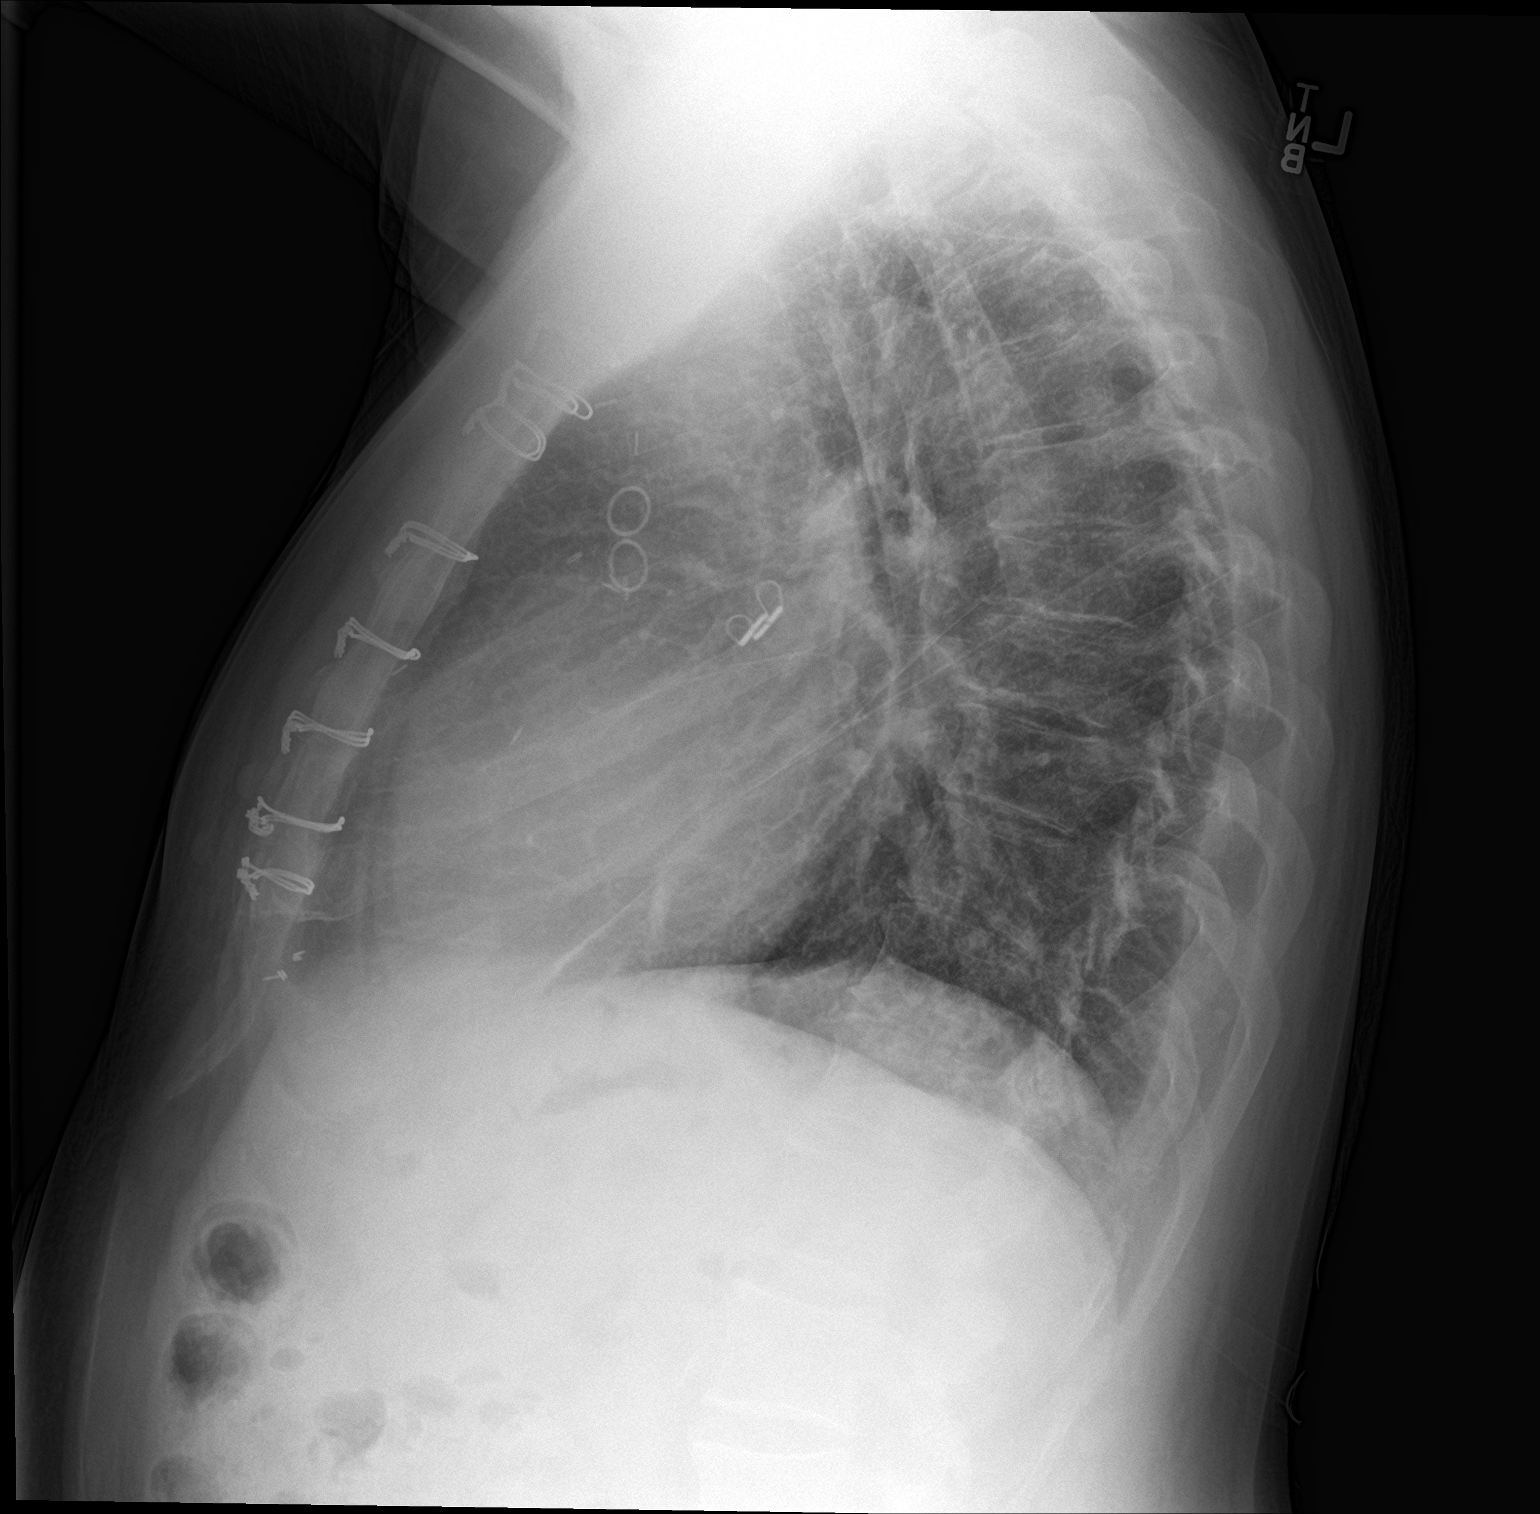

[2 of 2 positions shown; findings below may reference images not displayed]

FINDINGS: Upper normal size of cardiac silhouette post CABG and LEFT atrial
appendage clipping.

Mediastinal contours and pulmonary vascularity normal.

Lungs clear.

Resolution of small bibasilar effusions and basilar atelectasis seen
on previous exam.

No infiltrate or pneumothorax.

Osseous structures unremarkable.
IMPRESSION: Post CABG and LEFT atrial appendage clipping.

Improved bibasilar aeration.

## 2016-08-04 NOTE — Telephone Encounter (Signed)
Requesting order for blood work for appointment on 08/18/16 with Dr. Richardson Landry.

## 2016-08-04 NOTE — Telephone Encounter (Signed)
Lip liv m7 

## 2016-08-04 NOTE — Telephone Encounter (Signed)
Patient would like this printed out so he can take to [facility] to have completed.

## 2016-08-04 NOTE — Telephone Encounter (Signed)
Patient notified bloodwork ready for pickup.

## 2016-08-11 ENCOUNTER — Other Ambulatory Visit (HOSPITAL_COMMUNITY)
Admission: RE | Admit: 2016-08-11 | Discharge: 2016-08-11 | Disposition: A | Discharge status: Home / Self Care | Payer: 59 | Source: Ambulatory Visit | Attending: Family Medicine | Admitting: Family Medicine

## 2016-08-11 DIAGNOSIS — E785 Hyperlipidemia, unspecified: Secondary | ICD-10-CM | POA: Insufficient documentation

## 2016-08-11 DIAGNOSIS — Z79899 Other long term (current) drug therapy: Secondary | ICD-10-CM | POA: Insufficient documentation

## 2016-08-11 LAB — HEPATIC FUNCTION PANEL
ALT: 25 U/L (ref 17–63)
AST: 23 U/L (ref 15–41)
Albumin: 3.9 g/dL (ref 3.5–5.0)
Alkaline Phosphatase: 100 U/L (ref 38–126)
BILIRUBIN DIRECT: 0.1 mg/dL (ref 0.1–0.5)
BILIRUBIN INDIRECT: 0.7 mg/dL (ref 0.3–0.9)
Total Bilirubin: 0.8 mg/dL (ref 0.3–1.2)
Total Protein: 6.9 g/dL (ref 6.5–8.1)

## 2016-08-11 LAB — LIPID PANEL
Cholesterol: 96 mg/dL (ref 0–200)
HDL: 29 mg/dL — AB (ref >40)
LDL CALC: 53 mg/dL (ref 0–99)
TRIGLYCERIDES: 69 mg/dL (ref <150)
Total CHOL/HDL Ratio: 3.3 RATIO
VLDL: 14 mg/dL (ref 0–40)

## 2016-08-11 LAB — BASIC METABOLIC PANEL
ANION GAP: 7 (ref 5–15)
BUN: 20 mg/dL (ref 6–20)
CALCIUM: 9 mg/dL (ref 8.9–10.3)
CO2: 28 mmol/L (ref 22–32)
Chloride: 104 mmol/L (ref 101–111)
Creatinine, Ser: 0.93 mg/dL (ref 0.61–1.24)
Glucose, Bld: 134 mg/dL — ABNORMAL HIGH (ref 65–99)
Potassium: 4.4 mmol/L (ref 3.5–5.1)
SODIUM: 139 mmol/L (ref 135–145)

## 2016-08-18 ENCOUNTER — Encounter: Payer: Self-pay | Admitting: Family Medicine

## 2016-08-18 ENCOUNTER — Ambulatory Visit (INDEPENDENT_AMBULATORY_CARE_PROVIDER_SITE_OTHER): Payer: 59 | Admitting: Family Medicine

## 2016-08-18 VITALS — BP 144/98 | Ht 72.0 in | Wt 239.0 lb

## 2016-08-18 DIAGNOSIS — R739 Hyperglycemia, unspecified: Secondary | ICD-10-CM | POA: Diagnosis not present

## 2016-08-18 DIAGNOSIS — E785 Hyperlipidemia, unspecified: Secondary | ICD-10-CM

## 2016-08-18 DIAGNOSIS — I10 Essential (primary) hypertension: Secondary | ICD-10-CM | POA: Diagnosis not present

## 2016-08-18 DIAGNOSIS — I509 Heart failure, unspecified: Secondary | ICD-10-CM | POA: Diagnosis not present

## 2016-08-18 LAB — POCT GLYCOSYLATED HEMOGLOBIN (HGB A1C): HEMOGLOBIN A1C: 5

## 2016-08-18 MED ORDER — ATORVASTATIN CALCIUM 80 MG PO TABS: 80.0000 mg | ORAL_TABLET | Freq: Every day | ORAL | 1 refills | Status: DC

## 2016-08-18 NOTE — Progress Notes (Signed)
Subjective:    Patient ID: [name] [name], male    DOB: Aug 04, 1960, 56 y.o.   MRN: [id number]  Hyperlipidemia  This is a chronic problem. The current episode started more than 1 year ago. There are no compliance problems (takes meds every day, eats healthy, exercises).    Pt glucose 134 on bloodwork. Pt states he was fasting for 8 hours.   Recent Results (from the past 2160 hour(s))  Estradiol     Status: None   Collection Time: 06/29/16 10:35 AM  Result Value Ref Range   Estradiol 35.0 7.6 - 42.6 pg/mL    Comment: (NOTE) Roche ECLIA methodology Performed At: [facility] [place], [place] [id number] Lindon Romp MD [id number]   Raynaldo Opitz and Total     Status: None   Collection Time: 06/29/16 10:35 AM  Result Value Ref Range   Testosterone 676 264 - 916 ng/dL    Comment: (NOTE) Adult male reference interval is based on a population of healthy nonobese males (BMI <30) between 32 and 52 years old. [place], [place] [phone]. PMID: [id number].    Testosterone, Free 19.6 7.2 - 24.0 pg/mL    Comment: (NOTE) Performed At: [facility] [place], [place] [id number] Lindon Romp MD [id number]   TSH     Status: None   Collection Time: 06/29/16 10:35 AM  Result Value Ref Range   TSH 2.921 0.350 - 4.500 uIU/mL    Comment: Performed by a 3rd Generation assay with a functional sensitivity of <=0.01 uIU/mL.  T4, free     Status: None   Collection Time: 06/29/16 10:35 AM  Result Value Ref Range   Free T4 0.93 0.61 - 1.12 ng/dL    Comment: (NOTE) Biotin ingestion may interfere with free T4 tests. If the results are inconsistent with the TSH level, previous test results, or the clinical presentation, then consider biotin interference. If needed, order repeat testing after stopping biotin. Performed at [facility]   Thyroid peroxidase antibody     Status: None   Collection Time: 06/29/16 10:35 AM    Result Value Ref Range   Thyroperoxidase Ab SerPl-aCnc 11 0 - 34 IU/mL    Comment: (NOTE) Performed At: [facility] [place], [place] [id number] Lindon Romp MD [id number]   Thyroglobulin     Status: None   Collection Time: 06/29/16 10:35 AM  Result Value Ref Range   Thyroglobulin 9.7 ng/mL    Comment: (NOTE) Reference Range: Pubertal Children and Adults: <40 According to the [facility] of Clinical Biochemistry, the reference interval for Thyroglobulin (TG) should be related to euthyroid patients and not for patients who underwent thyroidectomy.  TG reference intervals for these patients depend on the residual mass of the thyroid tissue left after surgery.  Establishing a post-operative baseline is recommended.  The assay quantitation limit is 2.0 ng/mL. Performed At: [facility] [place], [place] [id number] Pepkowitz Sheral Apley MD [id number]   Thyroglobulin antibody     Status: None   Collection Time: 06/29/16 10:35 AM  Result Value Ref Range   Thyroglobulin Antibody <1.0 0.0 - 0.9 IU/mL    Comment: (NOTE) Thyroglobulin Antibody measured by [facility] Methodology Performed At: [facility] [place], [place] [id number] Lindon Romp MD [id number]   T3, free     Status: None   Collection Time: 06/29/16 10:35 AM  Result Value Ref Range  T3, Free 4.2 2.0 - 4.4 pg/mL    Comment: (NOTE) Performed At: [facility] [place], [place] [id number] Lindon Romp MD [id number]   PSA     Status: None   Collection Time: 06/29/16 10:35 AM  Result Value Ref Range   PSA 0.53 0.00 - 4.00 ng/mL    Comment: (NOTE) While PSA levels of <=4.0 ng/ml are reported as reference range, some men with levels below 4.0 ng/ml can have prostate cancer and many men with PSA above 4.0 ng/ml do not have prostate cancer.  Other tests such as free PSA, age specific  reference ranges, PSA velocity and PSA doubling time may be helpful especially in men less than 60 years old. Performed at [facility]   T3, reverse     Status: None   Collection Time: 06/29/16 10:35 AM  Result Value Ref Range   T3, Reverse 22 8 - 25 ng/dL    Comment: (NOTE) This test was developed and its analytical performance characteristics have been determined by [place], [place]. It has not been cleared or approved by the U.S. Food and Drug Administration. This assay has been validated pursuant to the CLIA regulations and is used for clinical purposes. Performed at [place] 01/02 AT 7543: PREVIOUSLY REPORTED AS 22 Reference range: 8 to 25 Unit: ng/dL   Progesterone     Status: None   Collection Time: 06/29/16 10:35 AM  Result Value Ref Range   Progesterone 0.3 0.0 - 0.5 ng/mL    Comment: (NOTE) Performed At: [facility] [place], [place] [id number] Lindon Romp MD [id number]   Vitamin B12     Status: None   Collection Time: 06/29/16 10:35 AM  Result Value Ref Range   Vitamin B-12 244 180 - 914 pg/mL    Comment: (NOTE) This assay is not validated for testing neonatal or myeloproliferative syndrome specimens for Vitamin B12 levels. Performed at [place] D 25 Hydroxy (Vit-D Deficiency, Fractures)     Status: Abnormal   Collection Time: 06/29/16 10:35 AM  Result Value Ref Range   Vit D, 25-Hydroxy 17.7 (L) 30.0 - 100.0 ng/mL    Comment: (NOTE) Vitamin D deficiency has been defined by the [place] practice guideline as a level of serum 25-OH vitamin D less than 20 ng/mL (1,2). The Endocrine Society went on to further define vitamin D insufficiency as a level between 21 and 29 ng/mL (2). 1. IOM (Institute of Medicine). 2010. Dietary reference   intakes for calcium and D. [place]: The   [employer]. 2. Holick MF, Binkley [place], Bischoff-Ferrari HA, et al.   Evaluation, treatment, and prevention of vitamin D   deficiency: an Endocrine Society clinical practice   guideline. JCEM. 2011 Jul; 96(7):1911-30. Performed At: [facility] [place], [place] [id number] Lindon Romp MD [id number]   Homocysteine     Status: Abnormal   Collection Time: 06/29/16 10:35 AM  Result Value Ref Range   Homocysteine 17.0 (H) 0.0 - 15.0 umol/L    Comment: (NOTE) Performed At: [facility] [place], [place] [id number] Lindon Romp MD [id number]   Lipid panel     Status: Abnormal   Collection Time: 08/11/16  8:04 AM  Result Value Ref Range   Cholesterol 96 0 - 200 mg/dL   Triglycerides 69 <150 mg/dL  HDL 29 (L) >40 mg/dL   Total CHOL/HDL Ratio 3.3 RATIO   VLDL 14 0 - 40 mg/dL   LDL Cholesterol 53 0 - 99 mg/dL    Comment:        Total Cholesterol/HDL:CHD Risk Coronary Heart Disease Risk Table                     Men   Women  1/2 Average Risk   3.4   3.3  Average Risk       5.0   4.4  2 X Average Risk   9.6   7.1  3 X Average Risk  23.4   11.0        Use the calculated Patient Ratio above and the CHD Risk Table to determine the patient's CHD Risk.        ATP III CLASSIFICATION (LDL):  <100     mg/dL   Optimal  467-322  mg/dL   Near or Above                    Optimal  130-159  mg/dL   Borderline  083-994  mg/dL   High  >918     mg/dL   Very High   Hepatic function panel     Status: None   Collection Time: 08/11/16  8:04 AM  Result Value Ref Range   Total Protein 6.9 6.5 - 8.1 g/dL   Albumin 3.9 3.5 - 5.0 g/dL   AST 23 15 - 41 U/L   ALT 25 17 - 63 U/L   Alkaline Phosphatase 100 38 - 126 U/L   Total Bilirubin 0.8 0.3 - 1.2 mg/dL   Bilirubin, Direct 0.1 0.1 - 0.5 mg/dL   Indirect Bilirubin 0.7 0.3 - 0.9 mg/dL  Basic metabolic panel     Status: Abnormal   Collection Time: 08/11/16  8:04 AM  Result Value Ref Range    Sodium 139 135 - 145 mmol/L   Potassium 4.4 3.5 - 5.1 mmol/L   Chloride 104 101 - 111 mmol/L   CO2 28 22 - 32 mmol/L   Glucose, Bld 134 (H) 65 - 99 mg/dL   BUN 20 6 - 20 mg/dL   Creatinine, Ser 0.65 0.61 - 1.24 mg/dL   Calcium 9.0 8.9 - 14.2 mg/dL   GFR calc non Af Amer >60 >60 mL/min   GFR calc Af Amer >60 >60 mL/min    Comment: (NOTE) The eGFR has been calculated using the CKD EPI equation. This calculation has not been validated in all clinical situations. eGFR's persistently <60 mL/min signify possible Chronic Kidney Disease.    Anion gap 7 5 - 15  POCT glycosylated hemoglobin (Hb A1C)     Status: None   Collection Time: 08/18/16  3:46 PM  Result Value Ref Range   Hemoglobin A1C 5.0    Patient continues to take lipid medication regularly. No obvious side effects from it. Generally does not miss a dose. Prior blood work results are reviewed with patient. Patient continues to work on fat intake in diet  Blood pressure medicine and blood pressure levels reviewed today with patient. Compliant with blood pressure medicine. States does not miss a dose. No obvious side effects. Blood pressure generally good when checked elsewhere. Watching salt intake.  Breathing quite a bit better alking a lot reg exrcise,   Watching diet  Eating better, has cut down saltNext  Patient still facing the sequela of multi-vessel bypass. Continues to  have exertional fatigue with high exertion. No angina like symptoms fortunately. Exercising regularly. Thankfully has smoked and continues to avoid smoking.  No chest pain. No skipped heartbeats A1C done today. 5.0  Review of Systems No headache, no major weight loss or weight gain, no chest pain no back pain abdominal pain no change in bowel habits complete ROS otherwise negative     Objective:   Physical Exam Alert vitals stable, NAD. Blood pressure good on repeat. HEENT normal. Lungs clear. Heart regular rate and rhythm. Ankles no significant  edema       Assessment & Plan:  Impression 1 hypertension good control discussed maintain same meds #2 hyperlipidemia good control discuss even for problem #3 to maintain same meds diet exercise discussed encourage #3 coronary artery disease long discussion held from the family doctor standpoint. Reviewed echo results. Long-term longevity potential patient avoids tobacco: Continues to exercise to stick with medications multiple questions answered #4 transient hypoglycemia resolved with normal A1c plan diet exercise discussed maintain same meds recheck in 6 months follow-up 6 months for wellness and checkup for chronic problems also

## 2016-09-18 ENCOUNTER — Ambulatory Visit: Payer: Self-pay | Admitting: *Deleted

## 2016-09-18 DIAGNOSIS — Z5181 Encounter for therapeutic drug level monitoring: Secondary | ICD-10-CM

## 2016-09-18 DIAGNOSIS — I4891 Unspecified atrial fibrillation: Secondary | ICD-10-CM

## 2016-10-02 ENCOUNTER — Ambulatory Visit (HOSPITAL_COMMUNITY)
Admission: RE | Admit: 2016-10-02 | Discharge: 2016-10-02 | Disposition: A | Discharge status: Home / Self Care | Payer: 59 | Source: Ambulatory Visit | Attending: Cardiovascular Disease | Admitting: Cardiovascular Disease

## 2016-10-02 DIAGNOSIS — I5022 Chronic systolic (congestive) heart failure: Secondary | ICD-10-CM | POA: Diagnosis not present

## 2016-10-02 DIAGNOSIS — I420 Dilated cardiomyopathy: Secondary | ICD-10-CM | POA: Diagnosis not present

## 2016-10-02 DIAGNOSIS — I501 Left ventricular failure: Secondary | ICD-10-CM | POA: Diagnosis not present

## 2016-10-02 LAB — ECHOCARDIOGRAM COMPLETE
CHL CUP MV DEC (S): 268
CHL CUP RV SYS PRESS: 16 mmHg
CHL CUP TV REG PEAK VELOCITY: 181 cm/s
E decel time: 268 msec
E/e' ratio: 5.26
FS: 21 % — AB (ref 28–44)
IVS/LV PW RATIO, ED: 0.92
LA vol index: 30.5 mL/m2
LA vol: 70.1 mL
LADIAMINDEX: 1.87 cm/m2
LASIZE: 43 mm
LAVOLA4C: 59.6 mL
LEFT ATRIUM END SYS DIAM: 43 mm
LV E/e' medial: 5.26
LV E/e'average: 5.26
LV PW d: 9.81 mm — AB (ref 0.6–1.1)
LV TDI E'LATERAL: 9.68
LV TDI E'MEDIAL: 7.4
LV dias vol index: 52 mL/m2
LV sys vol index: 29 mL/m2
LV sys vol: 67 mL — AB (ref 21–61)
LVDIAVOL: 120 mL (ref 62–150)
LVELAT: 9.68 cm/s
LVOT VTI: 18.4 cm
LVOT area: 3.14 cm2
LVOT peak grad rest: 4 mmHg
LVOT peak vel: 94.5 cm/s
LVOTD: 20 mm
LVOTSV: 58 mL
Lateral S' vel: 9.57 cm/s
MV pk E vel: 50.9 m/s
MVPKAVEL: 67.6 m/s
Simpson's disk: 45
Stroke v: 53 ml
TAPSE: 15.7 mm
TRMAXVEL: 181 cm/s

## 2016-10-02 NOTE — Progress Notes (Signed)
*  PRELIMINARY RESULTS* Echocardiogram 2D Echocardiogram has been performed.  [name] [name] 10/02/2016, 9:15 AM

## 2016-10-04 ENCOUNTER — Other Ambulatory Visit (HOSPITAL_COMMUNITY): Payer: Self-pay | Admitting: Internal Medicine

## 2016-10-06 ENCOUNTER — Ambulatory Visit (INDEPENDENT_AMBULATORY_CARE_PROVIDER_SITE_OTHER): Payer: 59 | Admitting: Cardiovascular Disease

## 2016-10-06 ENCOUNTER — Encounter: Payer: Self-pay | Admitting: Cardiovascular Disease

## 2016-10-06 VITALS — BP 124/84 | HR 89 | Ht 72.0 in | Wt 241.0 lb

## 2016-10-06 DIAGNOSIS — I739 Peripheral vascular disease, unspecified: Secondary | ICD-10-CM

## 2016-10-06 DIAGNOSIS — I10 Essential (primary) hypertension: Secondary | ICD-10-CM | POA: Diagnosis not present

## 2016-10-06 DIAGNOSIS — I25708 Atherosclerosis of coronary artery bypass graft(s), unspecified, with other forms of angina pectoris: Secondary | ICD-10-CM

## 2016-10-06 DIAGNOSIS — I5042 Chronic combined systolic (congestive) and diastolic (congestive) heart failure: Secondary | ICD-10-CM

## 2016-10-06 NOTE — Patient Instructions (Signed)
Your physician wants you to follow-up in: 6 months Dr Koneswaran You will receive a reminder letter in the mail two months in advance. If you don't receive a letter, please call our office to schedule the follow-up appointment.    Your physician recommends that you continue on your current medications as directed. Please refer to the Current Medication list given to you today.     If you need a refill on your cardiac medications before your next appointment, please call your pharmacy.     Thank you for choosing [facility] !         

## 2016-10-06 NOTE — Progress Notes (Signed)
SUBJECTIVE: The patient presents for follow-up of coronary artery disease with a history of CABG as well as chronic combined systolic and diastolic heart failure.  Echocardiogram 10/02/16: Moderate to severely reduced left ventricular systolic function, LVEF 08-14%, grade 1 diastolic dysfunction, moderate left atrial enlargement.  The patient denies any symptoms of chest pain, palpitations, shortness of breath, lightheadedness, dizziness, leg swelling, orthopnea, PND, and syncope.  He and his wife have several questions regarding his medications and his echocardiogram report which I answered.  Soc: He works for the maintenance department at [employer]: As per "subjective", otherwise negative.  No Known Allergies  Current Outpatient Prescriptions  Medication Sig Dispense Refill  . acetaminophen (TYLENOL) 500 MG tablet Take 1,000 mg by mouth every 6 (six) hours as needed for moderate pain or headache.    Marland Kitchen aspirin 81 MG EC tablet Take 1 tablet (81 mg total) by mouth daily. 30 tablet   . atorvastatin (LIPITOR) 80 MG tablet Take 1 tablet (80 mg total) by mouth daily at 6 PM. 90 tablet 1  . carvedilol (COREG) 6.25 MG tablet Take 1 tablet (6.25 mg total) by mouth 2 (two) times daily with a meal. 180 tablet 3  . ENTRESTO 24-26 MG TAKE 1 TABLET BY MOUTH 2 (TWO) TIMES DAILY. 60 tablet 3  . furosemide (LASIX) 40 MG tablet Take 0.5 tablets (20 mg total) by mouth daily. 45 tablet 3  . sildenafil (VIAGRA) 100 MG tablet Take 1 tablet (100 mg total) by mouth daily as needed for erectile dysfunction. 10 tablet 2  . spironolactone (ALDACTONE) 25 MG tablet Take 0.5 tablets (12.5 mg total) by mouth daily. 45 tablet 3   No current facility-administered medications for this visit.     Past Medical History:  Diagnosis Date  . Acute on chronic combined systolic and diastolic CHF, NYHA class 4 ([place]) 04/02/2016  . Acute systolic congestive heart failure ([place])   . Atrial  fibrillation with rapid ventricular response ([place]) 12/13/2015   "had a heart monitor and can't find a source for the a. fib"  . Cardiomyopathy ([place]) 12/13/2015  . Cardiomyopathy, ischemic 12/14/2015   EF 20-25%  . Coronary artery disease involving native coronary artery with unstable angina pectoris ([place]) 12/15/2015  . Hypercholesterolemia 12/15/2015  . Mitral regurgitation 12/14/2015   moderate  . NSTEMI (non-ST elevated myocardial infarction) ([place])   . PONV (postoperative nausea and vomiting)    "sick with very first surgery but nothing since" in 1990s  . 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 Tobacco abuse     Past Surgical History:  Procedure Laterality Date  . CARDIAC CATHETERIZATION N/A 12/15/2015   Procedure: Right/Left Heart Cath and Coronary Angiography;  Surgeon: Wellington Hampshire, MD;  Location: [facility];  Service: Cardiovascular;  Laterality: N/A;  . CLIPPING OF ATRIAL APPENDAGE Left 12/17/2015   Procedure: CLIPPING OF ATRIAL APPENDAGE;  Surgeon: [name], MD;  Location: [facility];  Service: Open Heart Surgery;  Laterality: Left;  . COLONOSCOPY WITH PROPOFOL N/A 06/21/2016   Procedure: COLONOSCOPY WITH PROPOFOL;  Surgeon: Manus Gunning, MD;  Location: [facility];  Service: Gastroenterology;  Laterality: N/A;  . CORONARY ARTERY BYPASS GRAFT N/A 12/17/2015   Procedure: CORONARY ARTERY BYPASS GRAFTING (CABG) x three , using left internal mammary artery and right  leg greater saphenous vein;  Surgeon: [name], MD;  Location: [facility];  Service: Open Heart Surgery;  Laterality: N/A;  . ENDARTERECTOMY Left 02/09/2016   Procedure: LEFT CAROTID ENDARTERECTOMY;  Surgeon: Serafina [place], MD;  Location: [facility];  Service: Vascular;  Laterality: Left;  . KNEE ARTHROSCOPY Right 1991  . PATCH ANGIOPLASTY Left 02/09/2016   Procedure: PATCH ANGIOPLASTY USING Rueben Bash  BIOLOGIC PATCH;  Surgeon: Serafina [place], MD;  Location: [facility];  Service: Vascular;  Laterality: Left;    Social History   Social History  . Marital status: Married    Spouse name: N/A  . Number of children: 2  . Years of education: N/A   Occupational History  . Maintanence [employer]    [employer]   Social History Main Topics  . Smoking status: Former Smoker    Packs/day: 1.00    Years: 35.00    Types: Cigarettes    Quit date: 12/13/2015  . Smokeless tobacco: Former Systems developer    Quit date: 12/13/2015  . Alcohol use 0.0 oz/week     Comment: rare  . Drug use: No  . Sexual activity: Yes   Other Topics Concern  . Not on file   Social History Narrative  . No narrative on file     Vitals:   10/06/16 0822  BP: 124/84  Pulse: 89  SpO2: 99%  Weight: 241 lb (109.3 kg)  Height: 6' (1.829 m)    Wt Readings from Last 3 Encounters:  10/06/16 241 lb (109.3 kg)  08/18/16 239 lb (108.4 kg)  06/21/16 232 lb (105.2 kg)     PHYSICAL EXAM General: NAD HEENT: Normal. Neck: No JVD, no thyromegaly. Lungs: Clear to auscultation bilaterally with normal respiratory effort. CV: Nondisplaced PMI.  Regular rate and rhythm, normal S1/S2, no S3/S4, no murmur. No pretibial or periankle edema.  No carotid bruit.   Abdomen: Soft, nontender, no distention.  Neurologic: Alert and oriented.  Psych: Normal affect. Skin: Normal. Musculoskeletal: No gross deformities.    ECG: Most recent ECG reviewed.   Labs: Lab Results  Component Value Date/Time   K 4.4 08/11/2016 08:04 AM   BUN 20 08/11/2016 08:04 AM   CREATININE 0.93 08/11/2016 08:04 AM   ALT 25 08/11/2016 08:04 AM   TSH 2.921 06/29/2016 10:35 AM   HGB 15.2 04/03/2016 08:12 AM     Lipids: Lab Results  Component Value Date/Time   LDLCALC 53 08/11/2016 08:04 AM   CHOL 96 08/11/2016 08:04 AM   TRIG 69 08/11/2016 08:04 AM   HDL 29 (L) 08/11/2016 08:04 AM       ASSESSMENT AND PLAN: 1. Coronary artery disease with a history of  CABG: Symptomatically stable. Continue aspirin, Lipitor, and carvedilol.  2. Chronic combined systolic and diastolic heart failure: Symptomatically stable. Continue carvedilol, spironolactone, and Entresto  3. Essential hypertension: Controlled on present therapy. No changes.  4. Peripheral vascular disease with a history of left carotid endarterectomy: Continue aspirin and statin.    Disposition: Follow up 6 months  Kate Sable, M.D., F.A.C.C.

## 2016-11-14 ENCOUNTER — Encounter: Payer: Self-pay | Admitting: Surgery

## 2016-11-20 ENCOUNTER — Ambulatory Visit (INDEPENDENT_AMBULATORY_CARE_PROVIDER_SITE_OTHER): Payer: 59 | Admitting: Surgery

## 2016-11-20 ENCOUNTER — Ambulatory Visit (HOSPITAL_COMMUNITY)
Admission: RE | Admit: 2016-11-20 | Discharge: 2016-11-20 | Disposition: A | Discharge status: Home / Self Care | Payer: 59 | Source: Ambulatory Visit | Attending: Surgery | Admitting: Surgery

## 2016-11-20 ENCOUNTER — Encounter: Payer: Self-pay | Admitting: Surgery

## 2016-11-20 VITALS — BP 139/86 | HR 68 | Temp 97.0°F | Resp 16 | Ht 72.0 in | Wt 245.0 lb

## 2016-11-20 DIAGNOSIS — I6522 Occlusion and stenosis of left carotid artery: Secondary | ICD-10-CM

## 2016-11-20 DIAGNOSIS — I6523 Occlusion and stenosis of bilateral carotid arteries: Secondary | ICD-10-CM | POA: Diagnosis not present

## 2016-11-20 DIAGNOSIS — Z9889 Other specified postprocedural states: Secondary | ICD-10-CM | POA: Insufficient documentation

## 2016-11-20 DIAGNOSIS — I6521 Occlusion and stenosis of right carotid artery: Secondary | ICD-10-CM | POA: Diagnosis not present

## 2016-11-20 LAB — VAS US CAROTID
LCCAPDIAS: 31 cm/s
LEFT ECA DIAS: -28 cm/s
LEFT VERTEBRAL DIAS: -11 cm/s
LICAPDIAS: -34 cm/s
LICAPSYS: -91 cm/s
Left CCA dist dias: 41 cm/s
Left CCA dist sys: 142 cm/s
Left CCA prox sys: 101 cm/s
Left ICA dist dias: -36 cm/s
Left ICA dist sys: -89 cm/s
RCCAPDIAS: 18 cm/s
RCCAPSYS: 140 cm/s
RIGHT CCA MID DIAS: 25 cm/s
RIGHT ECA DIAS: -35 cm/s
RIGHT VERTEBRAL DIAS: -19 cm/s
Right cca dist sys: -81 cm/s

## 2016-11-20 NOTE — Progress Notes (Signed)
History of Present Illness:  Patient is a 56 y.o. year old male who presents for surveillance s/p left ICA 02/08/2017.  .  The patient denies symptoms of TIA, amaurosis, or stroke.  During his pre-CABG workup he was found to have a high-grade left carotid stenosis on ultrasound. The patient is asymptomatic. Specifically, he denies numbness or weakness in either extremity. He denies slurred speech. He denies amaurosis fugax.   He has being doing very well since his CABG and Left ICA surgeries.  He exercises, eats healthy, stopped smoking and is maximally medically managed with a daily statin, 81 mg aspirin and beta blocker for HTN control.   Past Medical History:  Diagnosis Date  . Acute on chronic combined systolic and diastolic CHF, NYHA class 4 ([place]) 04/02/2016  . Acute systolic congestive heart failure ([place])   . Atrial fibrillation with rapid ventricular response ([place]) 12/13/2015   "had a heart monitor and can't find a source for the a. fib"  . Cardiomyopathy ([place]) 12/13/2015  . Cardiomyopathy, ischemic 12/14/2015   EF 20-25%  . Coronary artery disease involving native coronary artery with unstable angina pectoris ([place]) 12/15/2015  . Hypercholesterolemia 12/15/2015  . Mitral regurgitation 12/14/2015   moderate  . NSTEMI (non-ST elevated myocardial infarction) ([place])   . PONV (postoperative nausea and vomiting)    "sick with very first surgery but nothing since" in 1990s  . 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 Tobacco abuse     Past Surgical History:  Procedure Laterality Date  . CARDIAC CATHETERIZATION N/A 12/15/2015   Procedure: Right/Left Heart Cath and Coronary Angiography;  Surgeon: Wellington Hampshire, MD;  Location: [facility];  Service: Cardiovascular;  Laterality: N/A;  . CLIPPING OF ATRIAL APPENDAGE Left 12/17/2015   Procedure: CLIPPING OF ATRIAL  APPENDAGE;  Surgeon: [name], MD;  Location: [facility];  Service: Open Heart Surgery;  Laterality: Left;  . COLONOSCOPY WITH PROPOFOL N/A 06/21/2016   Procedure: COLONOSCOPY WITH PROPOFOL;  Surgeon: Manus Gunning, MD;  [facility];  Service: Gastroenterology;  Laterality: N/A;  . CORONARY ARTERY BYPASS GRAFT N/A 12/17/2015   Procedure: CORONARY ARTERY BYPASS GRAFTING (CABG) x three , using left internal mammary artery and right leg greater saphenous vein;  Surgeon: [name], MD;  Location: [facility];  Service: Open Heart Surgery;  Laterality: N/A;  . ENDARTERECTOMY Left 02/09/2016   Procedure: LEFT CAROTID ENDARTERECTOMY;  Surgeon: Serafina Mitchell, MD;  Location: [facility];  Service: Vascular;  Laterality: Left;  . KNEE ARTHROSCOPY Right 1991  . PATCH ANGIOPLASTY Left 02/09/2016   Procedure: PATCH ANGIOPLASTY USING Rueben Bash BIOLOGIC PATCH;  Surgeon: Serafina Mitchell, MD;  Location: [facility];  Service: Vascular;  Laterality: Left;     Social History Social History  Substance Use Topics  . Smoking status: Former Smoker    Packs/day: 1.00    Years: 35.00    Types: Cigarettes    Quit date: 12/13/2015  . Smokeless tobacco: Former Systems developer    Quit date: 12/13/2015  . Alcohol use 0.0 oz/week     Comment: rare    Family History Family History  Problem Relation Age of Onset  . Heart attack Paternal Grandfather   . Heart failure Father   . Atrial fibrillation Father     Allergies  No  Known Allergies   Current Outpatient Prescriptions  Medication Sig Dispense Refill  . acetaminophen (TYLENOL) 500 MG tablet Take 1,000 mg by mouth every 6 (six) hours as needed for moderate pain or headache.    Marland Kitchen aspirin 81 MG EC tablet Take 1 tablet (81 mg total) by mouth daily. 30 tablet   . atorvastatin (LIPITOR) 80 MG tablet Take 1 tablet (80 mg total) by mouth daily at 6 PM. 90 tablet 1  . carvedilol (COREG) 6.25 MG tablet Take 1 tablet (6.25 mg total) by mouth 2 (two) times daily with a  meal. 180 tablet 3  . ENTRESTO 24-26 MG TAKE 1 TABLET BY MOUTH 2 (TWO) TIMES DAILY. 60 tablet 3  . furosemide (LASIX) 40 MG tablet Take 0.5 tablets (20 mg total) by mouth daily. 45 tablet 3  . sildenafil (VIAGRA) 100 MG tablet Take 1 tablet (100 mg total) by mouth daily as needed for erectile dysfunction. 10 tablet 2  . spironolactone (ALDACTONE) 25 MG tablet Take 0.5 tablets (12.5 mg total) by mouth daily. 45 tablet 3   No current facility-administered medications for this visit.     ROS:   General:  No weight loss, Fever, chills  HEENT: No recent headaches, no nasal bleeding, no visual changes, no sore throat  Neurologic: No dizziness, blackouts, seizures. No recent symptoms of stroke or mini- stroke. No recent episodes of slurred speech, or temporary blindness.  Cardiac: No recent episodes of chest pain/pressure, no shortness of breath at rest.  No shortness of breath with exertion.  Denies history of atrial fibrillation or irregular heartbeat  Vascular: No history of rest pain in feet.  No history of claudication.  No history of non-healing ulcer, No history of DVT   Pulmonary: No home oxygen, no productive cough, no hemoptysis,  No asthma or wheezing  Musculoskeletal:  [ ]  Arthritis, [ ]  Low back pain,  [ ]  Joint pain  Hematologic:No history of hypercoagulable state.  No history of easy bleeding.  No history of anemia  Gastrointestinal: No hematochezia or melena,  No gastroesophageal reflux, no trouble swallowing  Urinary: [ ]  chronic Kidney disease, [ ]  on HD - [ ]  MWF or [ ]  TTHS, [ ]  Burning with urination, [ ]  Frequent urination, [ ]  Difficulty urinating;   Skin: No rashes  Psychological: No history of anxiety,  No history of depression   Physical Examination  Vitals:   11/20/16 1542  BP: 139/86  Pulse: 68  Resp: 16  Temp: 97 F (36.1 C)  TempSrc: Oral  SpO2: 97%  Weight: 245 lb (111.1 kg)  Height: 6' (1.829 m)    Body mass index is 33.23 kg/m.  General:   Alert and oriented, no acute distress HEENT: Normal Neck: No bruit or JVD Pulmonary: Clear to auscultation bilaterally Cardiac: Regular Rate and Rhythm without murmur Gastrointestinal: Soft, non-tender, non-distended, no mass, no scars Skin: No rash Extremity Pulses:  2+ radial, brachial, femoral, dorsalis pedis, posterior tibial pulses bilaterally Musculoskeletal: No deformity or edema  Neurologic: Upper and lower extremity motor 5/5 and symmetric  DATA:  Carotid duplex Patent without re-stenosis left ICA Right ICA <40% stenosis    ASSESSMENT/PLAN:  S/P left CEA He is doing very well.  He exercises, eats well and no longer smokes tobacco.  He will continue to take aspirin, statin and beta blocker dialy.  We will schedule him for a follow up visit in 1 year for repeat carotid duplex.     COLLINS, EMMA MAUREEN PA-C  [facility]  Patient seen in conjunction with Dr. Trula Slade   VASCULAR QUALITY INITIATIVE FOLLOW UP DATA:  Current smoker: [  ] yes  [x  ] no  Living status: [x  ]  Home  [  ] Nursing home  [  ] Homeless    MEDS:  ASA [ x ] yes  [  ] no- [  ] medical reason  [  ] non compliant  STATIN  [x  ] yes  [  ] no- [  ] medical reason  [  ] non compliant  Beta blocker [ x ] yes  [  ] no- [  ] medical reason  [  ] non compliant  ACE inhibitor [x  ] yes  [  ] no- [  ] medical reason  [  ] non compliant  P2Y12 Antagonist [x  ] none  [  ] clopidogrel-Plavix  [  ] ticlopidine-Ticlid   [  ] prasugrel-Effient  [  ] ticagrelor- Brilinta    Anticoagulant [x  ] None  [  ] warfarin  [  ] rivaroxaban-Xarelto [  ] dabigatran- Pradaxa  Neurologic event since D/C:  [ x ] no  [  ] yes: [  ] eye event  [  ] cortical event  [  ] VB event  [  ] non specific event  [  ] right  [  ] left  [  ] TIA  [  ] stroke  Date:   Modified Rankin Score: 0  MI since D/C: [x  ] no  [  ] troponin only  [  ] EKG or clinical  Cranial nerve injury: [ x ] none  [  ]  resolved  [  ] persistent  Duplex CEA site: [  ] no  [ x ] yes - PSV= 110  EDV= 44  ICA/CCA ratio:   Stenosis= [ x ] <40% [  ] 40-59% [  ] 60-79%  [  ] > 80%  [  ]  Occluded  CEA site re-operation:  [x  ] no   [  ] yes- date of re-op:  CEA site PCI:   [ x ] no   [  ] yes- date of PCI:     I agree with the above.  I have seen and evaluated the patient.  He is status post left carotid endarterectomy for asymptomatic stenosis.  He remains without symptoms.  His ultrasound today showed no significant stenosis bilaterally.  He will be scheduled for follow-up ultrasound in one year  Wells Brabham

## 2016-11-22 NOTE — Addendum Note (Signed)
Addended by: Lianne Cure A on: 11/22/2016 03:08 PM   Modules accepted: Orders

## 2016-12-04 ENCOUNTER — Encounter: Payer: 59 | Admitting: Thoracic Surgery (Cardiothoracic Vascular Surgery)

## 2017-02-16 ENCOUNTER — Ambulatory Visit: Payer: 59 | Admitting: Family Medicine

## 2017-02-19 ENCOUNTER — Ambulatory Visit: Payer: 59 | Admitting: Family Medicine

## 2017-02-28 ENCOUNTER — Other Ambulatory Visit (HOSPITAL_COMMUNITY): Payer: Self-pay | Admitting: Cardiovascular Disease

## 2017-04-10 ENCOUNTER — Encounter: Payer: Self-pay | Admitting: Cardiovascular Disease

## 2017-04-10 ENCOUNTER — Ambulatory Visit (INDEPENDENT_AMBULATORY_CARE_PROVIDER_SITE_OTHER): Payer: 59 | Admitting: Cardiovascular Disease

## 2017-04-10 VITALS — BP 174/100 | HR 87 | Ht 72.0 in | Wt 252.0 lb

## 2017-04-10 DIAGNOSIS — I5042 Chronic combined systolic (congestive) and diastolic (congestive) heart failure: Secondary | ICD-10-CM | POA: Diagnosis not present

## 2017-04-10 DIAGNOSIS — I6523 Occlusion and stenosis of bilateral carotid arteries: Secondary | ICD-10-CM | POA: Diagnosis not present

## 2017-04-10 DIAGNOSIS — I4891 Unspecified atrial fibrillation: Secondary | ICD-10-CM | POA: Diagnosis not present

## 2017-04-10 DIAGNOSIS — I739 Peripheral vascular disease, unspecified: Secondary | ICD-10-CM | POA: Diagnosis not present

## 2017-04-10 DIAGNOSIS — I25708 Atherosclerosis of coronary artery bypass graft(s), unspecified, with other forms of angina pectoris: Secondary | ICD-10-CM | POA: Diagnosis not present

## 2017-04-10 DIAGNOSIS — Z951 Presence of aortocoronary bypass graft: Secondary | ICD-10-CM

## 2017-04-10 DIAGNOSIS — I5022 Chronic systolic (congestive) heart failure: Secondary | ICD-10-CM

## 2017-04-10 DIAGNOSIS — I10 Essential (primary) hypertension: Secondary | ICD-10-CM

## 2017-04-10 DIAGNOSIS — Z9114 Patient's other noncompliance with medication regimen: Secondary | ICD-10-CM

## 2017-04-10 MED ORDER — FUROSEMIDE 40 MG PO TABS: 20.0000 mg | ORAL_TABLET | Freq: Every day | ORAL | 3 refills | Status: DC

## 2017-04-10 MED ORDER — CARVEDILOL 6.25 MG PO TABS: 6.2500 mg | ORAL_TABLET | Freq: Two times a day (BID) | ORAL | 3 refills | Status: DC

## 2017-04-10 MED ORDER — SPIRONOLACTONE 25 MG PO TABS: 12.5000 mg | ORAL_TABLET | Freq: Every day | ORAL | 3 refills | Status: DC

## 2017-04-10 NOTE — Patient Instructions (Signed)
Your physician wants you to follow-up in: 6 months with Dr.Koneswaran You will receive a reminder letter in the mail two months in advance. If you don't receive a letter, please call our office to schedule the follow-up appointment.     Your physician recommends that you continue on your current medications as directed. Please refer to the Current Medication list given to you today.    If you need a refill on your cardiac medications before your next appointment, please call your pharmacy.      No testing ordered today.

## 2017-04-10 NOTE — Addendum Note (Signed)
Addended by: Barbarann Ehlers A on: 04/10/2017 08:47 AM   Modules accepted: Orders

## 2017-04-10 NOTE — Progress Notes (Signed)
SUBJECTIVE: The patient presents for follow-up of coronary artery disease with a history of CABG as well as chronic combined systolic and diastolic heart failure.  Echocardiogram 10/02/16: Moderately reduced left ventricular systolic function, LVEF 44-31%, grade 1 diastolic dysfunction, moderate left atrial enlargement.  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   He denies chest pain, shortness of breath, lightheadedness, dizziness, palpitations, and syncope.  He weighs himself daily and his weight stays between 240-243 pounds at home.  He eats salads, chicken, and avoids fried foods. He monitors his daily fluid intake.  He ran out of carvedilol, Lasix, and spironolactone about 2 weeks ago and didn't call to have them refilled because he knew he had an appointment today. We had a long talk about the importance of not missing daily medications.   Soc Hx: He works for the maintenance department at [employer]: As per "subjective", otherwise negative.  No Known Allergies  Current Outpatient Prescriptions  Medication Sig Dispense Refill  . acetaminophen (TYLENOL) 500 MG tablet Take 1,000 mg by mouth every 6 (six) hours as needed for moderate pain or headache.    Marland Kitchen aspirin 81 MG EC tablet Take 1 tablet (81 mg total) by mouth daily. 30 tablet   . atorvastatin (LIPITOR) 80 MG tablet Take 1 tablet (80 mg total) by mouth daily at 6 PM. 90 tablet 1  . carvedilol (COREG) 6.25 MG tablet Take 1 tablet (6.25 mg total) by mouth 2 (two) times daily with a meal. 180 tablet 3  . ENTRESTO 24-26 MG TAKE 1 TABLET BY MOUTH 2 TIMES DAILY. 60 tablet 3  . furosemide (LASIX) 40 MG tablet Take 0.5 tablets (20 mg total) by mouth daily. 45 tablet 3  . sildenafil (VIAGRA) 100 MG tablet Take 1 tablet (100 mg total) by mouth daily as needed for erectile dysfunction. 10 tablet 2  . spironolactone (ALDACTONE) 25 MG tablet Take  0.5 tablets (12.5 mg total) by mouth daily. 45 tablet 3   No current facility-administered medications for this visit.     Past Medical History:  Diagnosis Date  . Acute on chronic combined systolic and diastolic CHF, NYHA class 4 ([place]) 04/02/2016  . Acute systolic congestive heart failure ([place])   . Atrial fibrillation with rapid ventricular response ([place]) 12/13/2015   "had a heart monitor and can't find a source for the a. fib"  . Cardiomyopathy ([place]) 12/13/2015  . Cardiomyopathy, ischemic 12/14/2015   EF 20-25%  . Coronary artery disease involving native coronary artery with unstable angina pectoris ([place]) 12/15/2015  . Hypercholesterolemia 12/15/2015  . Mitral regurgitation 12/14/2015   moderate  . NSTEMI (non-ST elevated myocardial infarction) ([place])   . PONV (postoperative nausea and vomiting)    "sick with very first surgery but nothing since" in 1990s  . 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 Tobacco abuse     Past Surgical History:  Procedure Laterality Date  . CARDIAC CATHETERIZATION N/A 12/15/2015   Procedure: Right/Left Heart Cath and Coronary Angiography;  Surgeon: Wellington Hampshire, MD;  Location: [facility];  Service: Cardiovascular;  Laterality: N/A;  . CLIPPING OF ATRIAL APPENDAGE Left 12/17/2015   Procedure: CLIPPING OF ATRIAL APPENDAGE;  Surgeon: [name], MD;  Location: [facility];  Service: Open Heart Surgery;  Laterality: Left;  . COLONOSCOPY WITH PROPOFOL N/A 06/21/2016   Procedure: COLONOSCOPY WITH PROPOFOL;  Surgeon: Manus Gunning, MD;  [facility];  Service: Gastroenterology;  Laterality: N/A;  . CORONARY ARTERY BYPASS GRAFT N/A 12/17/2015   Procedure: CORONARY ARTERY BYPASS GRAFTING (CABG) x three , using left internal mammary artery and right leg greater saphenous vein;  Surgeon: [name], MD;  Location: [facility];  Service: Open  Heart Surgery;  Laterality: N/A;  . ENDARTERECTOMY Left 02/09/2016   Procedure: LEFT CAROTID ENDARTERECTOMY;  Surgeon: Serafina Mitchell, MD;  Location: [facility];  Service: Vascular;  Laterality: Left;  . KNEE ARTHROSCOPY Right 1991  . PATCH ANGIOPLASTY Left 02/09/2016   Procedure: PATCH ANGIOPLASTY USING Rueben Bash BIOLOGIC PATCH;  Surgeon: Serafina Mitchell, MD;  Location: [facility];  Service: Vascular;  Laterality: Left;    Social History   Social History  . Marital status: Married    Spouse name: N/A  . Number of children: 2  . Years of education: N/A   Occupational History  . Maintan[employer]   Social History Main Topics  . Smoking status: Former Smoker    Packs/day: 1.00    Years: 35.00    Types: Cigarettes    Quit date: 12/13/2015  . Smokeless tobacco: Former Systems developer    Quit date: 12/13/2015  . Alcohol use 0.0 oz/week     Comment: rare  . Drug use: No  . Sexual activity: Yes   Other Topics Concern  . Not on file   Social History Narrative  . No narrative on file     Vitals:   04/10/17 0824  BP: (!) 174/100  Pulse: 87  SpO2: 96%  Weight: 252 lb (114.3 kg)  Height: 6' (1.829 m)    Wt Readings from Last 3 Encounters:  04/10/17 252 lb (114.3 kg)  11/20/16 245 lb (111.1 kg)  10/06/16 241 lb (109.3 kg)     PHYSICAL EXAM General: NAD HEENT: Normal. Neck: No JVD, no thyromegaly. Lungs: Clear to auscultation bilaterally with normal respiratory effort. CV: Nondisplaced PMI.  Regular rate and rhythm, normal S1/S2, no S3/S4, no murmur. No pretibial or periankle edema.  No carotid bruit.   Abdomen: Soft, nontender, no distention.  Neurologic: Alert and oriented.  Psych: Normal affect. Skin: Normal. Musculoskeletal: No gross deformities.    ECG: Most recent ECG reviewed.   Labs: Lab Results  Component Value Date/Time   K 4.4 08/11/2016 08:04 AM   BUN 20 08/11/2016 08:04 AM   CREATININE 0.93 08/11/2016 08:04 AM   ALT 25 08/11/2016 08:04 AM   TSH 2.921  06/29/2016 10:35 AM   HGB 15.2 04/03/2016 08:12 AM     Lipids: Lab Results  Component Value Date/Time   LDLCALC 53 08/11/2016 08:04 AM   CHOL 96 08/11/2016 08:04 AM   TRIG 69 08/11/2016 08:04 AM   HDL 29 (L) 08/11/2016 08:04 AM       ASSESSMENT AND PLAN:  1. Coronary artery disease with a history of CABG: Symptomatically stable. Continue aspirin, Lipitor, and carvedilol. I will refill Coreg.   2. Chronic combined systolic and diastolic heart failure: Symptomatically stable. Continue carvedilol, spironolactone, and Entresto. I will refill all meds.  3. Essential hypertension: Markedly elevated. Hasn't taken nor spironolactone Coreg in 2 weeks. I will refill.  4. Peripheral vascular disease with a history of left carotid endarterectomy: 11/20/16 Dopplers showed less than 40% right internal carotid artery stenosis  and a patent left carotid endarterectomy. Continue aspirin and statin.  5. Medication noncompliance: He ran out of carvedilol, Lasix, and spironolactone about 2 weeks ago and didn't call to have them refilled because he knew he had an appointment today. We had a long talk about the importance of not missing daily medications.   Disposition: Follow up 6 months.   Kate Sable, M.D., F.A.C.C.

## 2017-04-27 DIAGNOSIS — H52223 Regular astigmatism, bilateral: Secondary | ICD-10-CM | POA: Diagnosis not present

## 2017-04-27 DIAGNOSIS — H524 Presbyopia: Secondary | ICD-10-CM | POA: Diagnosis not present

## 2017-04-27 DIAGNOSIS — H5213 Myopia, bilateral: Secondary | ICD-10-CM | POA: Diagnosis not present

## 2017-04-30 ENCOUNTER — Other Ambulatory Visit: Payer: Self-pay | Admitting: Family Medicine

## 2017-06-14 ENCOUNTER — Other Ambulatory Visit: Payer: Self-pay | Admitting: Family Medicine

## 2017-06-17 ENCOUNTER — Encounter: Payer: Self-pay | Admitting: Cardiovascular Disease

## 2017-06-18 ENCOUNTER — Other Ambulatory Visit: Payer: Self-pay | Admitting: *Deleted

## 2017-06-18 ENCOUNTER — Telehealth: Payer: Self-pay | Admitting: Family Medicine

## 2017-06-18 DIAGNOSIS — R739 Hyperglycemia, unspecified: Secondary | ICD-10-CM

## 2017-06-18 DIAGNOSIS — Z125 Encounter for screening for malignant neoplasm of prostate: Secondary | ICD-10-CM

## 2017-06-18 DIAGNOSIS — Z79899 Other long term (current) drug therapy: Secondary | ICD-10-CM

## 2017-06-18 DIAGNOSIS — E7800 Pure hypercholesterolemia, unspecified: Secondary | ICD-10-CM

## 2017-06-18 MED ORDER — LISINOPRIL 2.5 MG PO TABS: 2.5000 mg | ORAL_TABLET | Freq: Every day | ORAL | 1 refills | Status: DC

## 2017-06-18 NOTE — Telephone Encounter (Signed)
Patient is requesting orders for labs for his appointment on 06/29/17 with Dr. Richardson Landry.

## 2017-06-18 NOTE — Telephone Encounter (Signed)
I tried Delene Loll because it is a better medication than lisinopril for his heart.   If he is not tolerating, can try low-dose lisinopril 2.5 mg daily.   Spoke with pt and lisinopril 2.5 mg sent to [facility] as requested.

## 2017-06-18 NOTE — Telephone Encounter (Signed)
Rep same plus psa 

## 2017-06-18 NOTE — Telephone Encounter (Signed)
Blood work ordered in Epic. Patient notified. 

## 2017-06-18 NOTE — Telephone Encounter (Signed)
Patient had Lipid, Liver, Met 7 and HgbA1c in 08/2016

## 2017-06-21 ENCOUNTER — Other Ambulatory Visit (HOSPITAL_COMMUNITY)
Admission: RE | Admit: 2017-06-21 | Discharge: 2017-06-21 | Disposition: A | Discharge status: Home / Self Care | Payer: 59 | Source: Ambulatory Visit | Attending: Family Medicine | Admitting: Family Medicine

## 2017-06-21 DIAGNOSIS — Z79899 Other long term (current) drug therapy: Secondary | ICD-10-CM | POA: Insufficient documentation

## 2017-06-21 DIAGNOSIS — Z125 Encounter for screening for malignant neoplasm of prostate: Secondary | ICD-10-CM | POA: Insufficient documentation

## 2017-06-21 DIAGNOSIS — R739 Hyperglycemia, unspecified: Secondary | ICD-10-CM | POA: Diagnosis not present

## 2017-06-21 DIAGNOSIS — E7800 Pure hypercholesterolemia, unspecified: Secondary | ICD-10-CM | POA: Insufficient documentation

## 2017-06-21 LAB — LIPID PANEL
Cholesterol: 97 mg/dL (ref 0–200)
HDL: 34 mg/dL — ABNORMAL LOW (ref >40)
LDL CALC: 51 mg/dL (ref 0–99)
Total CHOL/HDL Ratio: 2.9 RATIO
Triglycerides: 58 mg/dL (ref <150)
VLDL: 12 mg/dL (ref 0–40)

## 2017-06-21 LAB — BASIC METABOLIC PANEL
Anion gap: 9 (ref 5–15)
BUN: 18 mg/dL (ref 6–20)
CHLORIDE: 106 mmol/L (ref 101–111)
CO2: 27 mmol/L (ref 22–32)
CREATININE: 1.11 mg/dL (ref 0.61–1.24)
Calcium: 8.8 mg/dL — ABNORMAL LOW (ref 8.9–10.3)
Glucose, Bld: 136 mg/dL — ABNORMAL HIGH (ref 65–99)
POTASSIUM: 4.6 mmol/L (ref 3.5–5.1)
SODIUM: 142 mmol/L (ref 135–145)

## 2017-06-21 LAB — HEPATIC FUNCTION PANEL
ALT: 15 U/L — ABNORMAL LOW (ref 17–63)
AST: 18 U/L (ref 15–41)
Albumin: 3.8 g/dL (ref 3.5–5.0)
Alkaline Phosphatase: 104 U/L (ref 38–126)
BILIRUBIN DIRECT: 0.1 mg/dL (ref 0.1–0.5)
BILIRUBIN TOTAL: 0.8 mg/dL (ref 0.3–1.2)
Indirect Bilirubin: 0.7 mg/dL (ref 0.3–0.9)
Total Protein: 6.8 g/dL (ref 6.5–8.1)

## 2017-06-21 LAB — PSA: PROSTATIC SPECIFIC ANTIGEN: 0.43 ng/mL (ref 0.00–4.00)

## 2017-06-21 LAB — HEMOGLOBIN A1C
HEMOGLOBIN A1C: 5.5 % (ref 4.8–5.6)
MEAN PLASMA GLUCOSE: 111.15 mg/dL

## 2017-06-29 ENCOUNTER — Ambulatory Visit (INDEPENDENT_AMBULATORY_CARE_PROVIDER_SITE_OTHER): Payer: 59 | Admitting: Family Medicine

## 2017-06-29 ENCOUNTER — Encounter: Payer: Self-pay | Admitting: Family Medicine

## 2017-06-29 VITALS — BP 162/98 | Ht 72.0 in | Wt 250.0 lb

## 2017-06-29 DIAGNOSIS — E785 Hyperlipidemia, unspecified: Secondary | ICD-10-CM | POA: Diagnosis not present

## 2017-06-29 DIAGNOSIS — E7800 Pure hypercholesterolemia, unspecified: Secondary | ICD-10-CM

## 2017-06-29 DIAGNOSIS — I481 Persistent atrial fibrillation: Secondary | ICD-10-CM

## 2017-06-29 DIAGNOSIS — R739 Hyperglycemia, unspecified: Secondary | ICD-10-CM

## 2017-06-29 DIAGNOSIS — I4819 Other persistent atrial fibrillation: Secondary | ICD-10-CM

## 2017-06-29 MED ORDER — CIPROFLOXACIN HCL 500 MG PO TABS: 500.0000 mg | ORAL_TABLET | Freq: Two times a day (BID) | ORAL | 0 refills | Status: DC

## 2017-06-29 MED ORDER — ATORVASTATIN CALCIUM 80 MG PO TABS: ORAL_TABLET | ORAL | 1 refills | Status: DC

## 2017-06-29 NOTE — Progress Notes (Signed)
Subjective:    Patient ID: [name] [name], male    DOB: Apr 19, 1961, 56 y.o.   MRN: [id number]  HPI Patient is here today to follow up on HTN. He eats healthy and gets plenty of exercise.He is currently taking lisinopril 2.5 mg one daily and carvedilol 6.25 mg BID.  Blood pressure medicine and blood pressure levels reviewed today with patient. Compliant with blood pressure medicine. States does not miss a dose. No obvious side effects. Blood pressure generally good when checked elsewhere. Watching salt intake.  Patient continues to take lipid medication regularly. No obvious side effects from it. Generally does not miss a dose. Prior blood work results are reviewed with patient. Patient continues to work on fat intake in diet   Zero nicotine    Results for orders placed or performed during the hospital encounter of 06/21/17  Lipid panel  Result Value Ref Range   Cholesterol 97 0 - 200 mg/dL   Triglycerides 58 <150 mg/dL   HDL 34 (L) >40 mg/dL   Total CHOL/HDL Ratio 2.9 RATIO   VLDL 12 0 - 40 mg/dL   LDL Cholesterol 51 0 - 99 mg/dL  Basic metabolic panel  Result Value Ref Range   Sodium 142 135 - 145 mmol/L   Potassium 4.6 3.5 - 5.1 mmol/L   Chloride 106 101 - 111 mmol/L   CO2 27 22 - 32 mmol/L   Glucose, Bld 136 (H) 65 - 99 mg/dL   BUN 18 6 - 20 mg/dL   Creatinine, Ser 1.11 0.61 - 1.24 mg/dL   Calcium 8.8 (L) 8.9 - 10.3 mg/dL   GFR calc non Af Amer >60 >60 mL/min   GFR calc Af Amer >60 >60 mL/min   Anion gap 9 5 - 15  Hemoglobin A1c  Result Value Ref Range   Hgb A1c MFr Bld 5.5 4.8 - 5.6 %   Mean Plasma Glucose 111.15 mg/dL  Hepatic function panel  Result Value Ref Range   Total Protein 6.8 6.5 - 8.1 g/dL   Albumin 3.8 3.5 - 5.0 g/dL   AST 18 15 - 41 U/L   ALT 15 (L) 17 - 63 U/L   Alkaline Phosphatase 104 38 - 126 U/L   Total Bilirubin 0.8 0.3 - 1.2 mg/dL   Bilirubin, Direct 0.1 0.1 - 0.5 mg/dL   Indirect Bilirubin 0.7 0.3 - 0.9 mg/dL  PSA  Result Value Ref Range     Prostatic Specific Antigen 0.43 0.00 - 4.00 ng/mL     Exercise not so good   Pt was on  ardiac med causign povlems , Review of Systems No headache, no major weight loss or weight gain, no chest pain no back pain abdominal pain no change in bowel habits complete ROS otherwise negative     Objective:   Physical Exam  Alert and oriented, vitals reviewed and stable, NAD ENT-TM's and ext canals WNL bilat via otoscopic exam Soft palate, tonsils and post pharynx WNL via oropharyngeal exam Neck-symmetric, no masses; thyroid nonpalpable and nontender Pulmonary-no tachypnea or accessory muscle use; Clear without wheezes via auscultation Card--no abnrml murmurs, rhythm reg and rate WNL Carotid pulses symmetric, without bruits       Assessment & Plan:  Impression 1 hyperlipidemia.  Control good.  Discussed to maintain  2.  Hypertension mainly managed  general cardiologist.  3.  History of recurrent infection per discussion L.  Like to have a prescription of Cipro on hand in case this flares up.  Prescribed  4.  Atrial fibrillation.  Clinically stable.  good control rate  5.  Impaired fasting glucose/prediabetes.  Stable discussed diet encouragedA1c good  Diet exercise discussed 6 months worth of medicine written

## 2017-07-06 ENCOUNTER — Encounter: Payer: Self-pay | Admitting: Gastroenterology

## 2017-08-23 ENCOUNTER — Telehealth: Payer: Self-pay | Admitting: Family Medicine

## 2017-08-23 NOTE — Telephone Encounter (Signed)
Pt is needing any lab orders for an upcoming physical to be ordered and printed. Last labs per Epic were: psa,hepatic,a1c,bmp,and lipid on 06/21/2017.

## 2017-08-23 NOTE — Telephone Encounter (Signed)
Spoke with pt and he verbalized understanding

## 2017-08-23 NOTE — Telephone Encounter (Signed)
Dec should cover

## 2017-09-10 ENCOUNTER — Encounter: Payer: Self-pay | Admitting: Family Medicine

## 2017-09-10 ENCOUNTER — Ambulatory Visit (INDEPENDENT_AMBULATORY_CARE_PROVIDER_SITE_OTHER): Payer: 59 | Admitting: Family Medicine

## 2017-09-10 VITALS — BP 158/100 | Ht 71.75 in | Wt 239.0 lb

## 2017-09-10 DIAGNOSIS — I10 Essential (primary) hypertension: Secondary | ICD-10-CM

## 2017-09-10 DIAGNOSIS — Z0000 Encounter for general adult medical examination without abnormal findings: Secondary | ICD-10-CM | POA: Diagnosis not present

## 2017-09-10 MED ORDER — LISINOPRIL 10 MG PO TABS: 10.0000 mg | ORAL_TABLET | Freq: Every day | ORAL | 1 refills | Status: DC

## 2017-09-10 NOTE — Progress Notes (Signed)
   Subjective:    Patient ID: [name] [name], male    DOB: 1961-03-28, 57 y.o.   MRN: [id number]  HPI The patient comes in today for a wellness visit.    A review of their health history was completed.  A review of medications was also completed.  Any needed refills; none  Eating habits: health conscious  Falls/  MVA accidents in past few months: none  Regular exercise: just work  Sales promotion account executive pt sees on regular basis: none  Preventative health issues were discussed.   Additional concerns: none  exrcise walking reg, trying to get outdoors as mucha as possible  No t soking, trying to   Watching sugar and salt in the diet      Review of Systems  Constitutional: Negative for activity change, appetite change and fever.  HENT: Negative for congestion and rhinorrhea.   Eyes: Negative for discharge.  Respiratory: Negative for cough and wheezing.   Cardiovascular: Negative for chest pain.  Gastrointestinal: Negative for abdominal pain, blood in stool and vomiting.  Genitourinary: Negative for difficulty urinating and frequency.  Musculoskeletal: Negative for neck pain.  Skin: Negative for rash.  Allergic/Immunologic: Negative for environmental allergies and food allergies.  Neurological: Negative for weakness and headaches.  Psychiatric/Behavioral: Negative for agitation.  All other systems reviewed and are negative.      Objective:   Physical Exam  Constitutional: He appears well-developed and well-nourished.  HENT:  Head: Normocephalic and atraumatic.  Right Ear: External ear normal.  Left Ear: External ear normal.  Nose: Nose normal.  Mouth/Throat: Oropharynx is clear and moist.  Eyes: Right eye exhibits no discharge. Left eye exhibits no discharge. No scleral icterus.  Neck: Normal range of motion. Neck supple. No thyromegaly present.  Cardiovascular: Normal rate, regular rhythm and normal heart sounds.  No murmur heard. Pulmonary/Chest: Effort normal and  breath sounds normal. No respiratory distress. He has no wheezes.  Abdominal: Soft. Bowel sounds are normal. He exhibits no distension and no mass. There is no tenderness.  Genitourinary: Penis normal.  Musculoskeletal: Normal range of motion. He exhibits no edema.  Lymphadenopathy:    He has no cervical adenopathy.  Neurological: He is alert. He exhibits normal muscle tone. Coordination normal.  Skin: Skin is warm and dry. No erythema.  Psychiatric: He has a normal mood and affect. His behavior is normal. Judgment normal.  Vitals reviewed.         Assessment & Plan:  Wellness colonoscopy due now (pt at first said ten yrs) based on very hi numv b of plyps identified just two yrs ago.  Patient still not smoking.  Thankfully diet discussed exercise discussed.  Under increased stress with family challenges these days  2.  Hypertension suboptimal control blood pressure on repeat still substantially elevated.  Medications are reviewed.  Creatinine excellent.  Will increase lisinopril to 10 mg daily rationale discussed.  Patient due to see cardiologist next month

## 2017-11-20 ENCOUNTER — Encounter (HOSPITAL_COMMUNITY): Payer: 59

## 2017-11-20 ENCOUNTER — Ambulatory Visit: Payer: 59 | Admitting: Family

## 2017-12-25 DIAGNOSIS — I868 Varicose veins of other specified sites: Secondary | ICD-10-CM

## 2018-01-28 ENCOUNTER — Other Ambulatory Visit: Payer: Self-pay | Admitting: Family Medicine

## 2018-01-30 ENCOUNTER — Encounter: Payer: Self-pay | Admitting: Family Medicine

## 2018-01-30 ENCOUNTER — Ambulatory Visit: Payer: 59 | Admitting: Family Medicine

## 2018-01-30 VITALS — BP 142/82 | Ht 71.75 in | Wt 241.8 lb

## 2018-01-30 DIAGNOSIS — R070 Pain in throat: Secondary | ICD-10-CM | POA: Diagnosis not present

## 2018-01-30 NOTE — Progress Notes (Addendum)
   Subjective:    Patient ID: [name] [name], male    DOB: 07/25/1960, 57 y.o.   MRN: [id number]  HPI  Patient arrives with a feeling that something is stuck in his throat for 2 weeks. Patient does not know of anything to trigger this Never had a before Has not tried anything other than swallowing water which has not helped He states that the constant feeling of something in his throat He does have a history of smoking but does not smoke any longer He denies eating any fish or any type of food that could have triggered this before it happened He has never had a before PMH benign    Review of Systems  Constitutional: Negative for activity change, chills and fever.  HENT: Positive for trouble swallowing. Negative for congestion, ear pain and rhinorrhea.   Eyes: Negative for discharge.  Respiratory: Negative for cough and wheezing.   Cardiovascular: Negative for chest pain and leg swelling.  Gastrointestinal: Negative for nausea and vomiting.  Musculoskeletal: Negative for arthralgias and back pain.       Objective:   Physical Exam  Constitutional: He appears well-nourished. No distress.  HENT:  Head: Normocephalic and atraumatic.  Eyes: Right eye exhibits no discharge. Left eye exhibits no discharge.  Neck: No tracheal deviation present.  Cardiovascular: Normal rate, regular rhythm and normal heart sounds.  No murmur heard. Pulmonary/Chest: Effort normal and breath sounds normal. No respiratory distress.  Musculoskeletal: He exhibits no edema.  Lymphadenopathy:    He has no cervical adenopathy.  Neurological: He is alert. Coordination normal.  Skin: Skin is warm and dry.  Psychiatric: He has a normal mood and affect. His behavior is normal.  Vitals reviewed. 15 minutes was spent with patient today discussing healthcare issues which they came.  More than 50% of this visit-total duration of visit-was spent in counseling and coordination of care.  Please see diagnosis regarding  the focus of this coordination and care   On physical exam I do not find any evidence of a foreign body in his throat but I cannot see into his pharynx  There is no adenopathy    Assessment & Plan:  With his complaint need to see ENT they need to do a thorough evaluation of his pharynx region this patient does have some risk factors for even cancer hopefully this is just a piece of food stuck there but once again needs thorough evaluation we will try to get him in with ENT as quickly as possible  It should be noted that the patient's daughter informed me this morning on August 1 that the sensation in the patient's throat is gone away and he did not feel he needed to see ENT.  We informed her to let him know that should issue recur we would recommend setting him up with her but since it is gone away there is no need to see ENT per patient request.  We will send a letter to this effect to the patient as well

## 2018-01-31 ENCOUNTER — Encounter: Payer: Self-pay | Admitting: Family Medicine

## 2018-01-31 NOTE — Progress Notes (Signed)
Referral cancelled. 

## 2018-04-01 ENCOUNTER — Other Ambulatory Visit: Payer: Self-pay | Admitting: Family Medicine

## 2018-05-03 DIAGNOSIS — H18413 Arcus senilis, bilateral: Secondary | ICD-10-CM | POA: Diagnosis not present

## 2018-05-03 DIAGNOSIS — H5213 Myopia, bilateral: Secondary | ICD-10-CM | POA: Diagnosis not present

## 2018-05-03 DIAGNOSIS — H52223 Regular astigmatism, bilateral: Secondary | ICD-10-CM | POA: Diagnosis not present

## 2018-05-03 DIAGNOSIS — H17822 Peripheral opacity of cornea, left eye: Secondary | ICD-10-CM | POA: Diagnosis not present

## 2018-05-17 ENCOUNTER — Ambulatory Visit: Payer: 59 | Admitting: Family Medicine

## 2018-05-17 ENCOUNTER — Encounter: Payer: Self-pay | Admitting: Family Medicine

## 2018-05-17 VITALS — BP 140/90 | Temp 98.5°F | Ht 71.75 in | Wt 237.6 lb

## 2018-05-17 DIAGNOSIS — M109 Gout, unspecified: Secondary | ICD-10-CM

## 2018-05-17 MED ORDER — PREDNISONE 20 MG PO TABS: ORAL_TABLET | ORAL | 0 refills | Status: DC

## 2018-05-17 MED ORDER — ATORVASTATIN CALCIUM 80 MG PO TABS: ORAL_TABLET | ORAL | 1 refills | Status: DC

## 2018-05-17 NOTE — Progress Notes (Signed)
   Subjective:    Patient ID: [name] [name], male    DOB: March 11, 1961, 57 y.o.   MRN: [id number]  Foot Pain  This is a new problem. The current episode started yesterday. Exacerbated by: anything touching toe. He has tried nothing for the symptoms.   Pt states last night his big toe on his right foot began to hurt and became swollen. No injury that patient is aware of.   Hit hard and bad, out of the blue  Fairly severe  Noted swelling and pain and tendrness  Patient takes both Lasix and spironolactone.  On further history every other day on average.  Known history of congestive heart failure.  Due to coronary artery disease.  No history of gout.  No personal family history of gout.  Complicated by history of foot cellulitis in the past      Review of Systems No headache, no major weight loss or weight gain, no chest pain no back pain abdominal pain no change in bowel habits complete ROS otherwise negative     Objective:   Physical Exam   Alert and oriented, vitals reviewed and stable, NAD ENT-TM's and ext canals WNL bilat via otoscopic exam Soft palate, tonsils and post pharynx WNL via oropharyngeal exam Neck-symmetric, no masses; thyroid nonpalpable and nontender Pulmonary-no tachypnea or accessory muscle use; Clear without wheezes via auscultation Card--no abnrml murmurs, rhythm reg and rate WNL Carotid pulses symmetric, without bruits Right great toe swollen erythematous very tender to palpation     Assessment & Plan:  Impression gout discussed.  Most likely diagnosis.  We will do uric acid.  Asked patient to start taking spironolactone every day.  May hold off on Lasix for now if no substantial weight gain or edema ensues.  Educational information given.  Diet discussed.  Prednisone taper.  May intervene further if continues to recur discussed  Greater than 50% of this 25 minute face to face visit was spent in counseling and discussion and coordination of care  regarding the above diagnosis/diagnosies

## 2018-05-17 NOTE — Patient Instructions (Signed)

## 2018-05-18 LAB — URIC ACID: Uric Acid: 7.8 mg/dL (ref 3.7–8.6)

## 2018-08-19 ENCOUNTER — Other Ambulatory Visit: Payer: Self-pay | Admitting: Cardiovascular Disease

## 2018-08-22 ENCOUNTER — Telehealth: Payer: Self-pay | Admitting: Cardiovascular Disease

## 2018-08-22 MED ORDER — SPIRONOLACTONE 25 MG PO TABS: ORAL_TABLET | ORAL | 3 refills | Status: DC

## 2018-08-22 MED ORDER — CARVEDILOL 6.25 MG PO TABS: 6.2500 mg | ORAL_TABLET | Freq: Two times a day (BID) | ORAL | 2 refills | Status: DC

## 2018-08-22 NOTE — Telephone Encounter (Signed)
Refills complete per pt request

## 2018-08-22 NOTE — Telephone Encounter (Signed)
Patient has scheduled appointment for f/u. Please call refiils on Carvedilol and Spironolactone to [facility]. / tg

## 2018-09-19 ENCOUNTER — Other Ambulatory Visit: Payer: Self-pay

## 2018-09-19 ENCOUNTER — Ambulatory Visit: Payer: 59 | Admitting: Cardiovascular Disease

## 2018-09-19 ENCOUNTER — Encounter: Payer: Self-pay | Admitting: Cardiovascular Disease

## 2018-09-19 VITALS — BP 156/101 | HR 75 | Temp 97.4°F | Ht 72.0 in | Wt 235.0 lb

## 2018-09-19 DIAGNOSIS — I6523 Occlusion and stenosis of bilateral carotid arteries: Secondary | ICD-10-CM | POA: Diagnosis not present

## 2018-09-19 DIAGNOSIS — Z720 Tobacco use: Secondary | ICD-10-CM

## 2018-09-19 DIAGNOSIS — I25708 Atherosclerosis of coronary artery bypass graft(s), unspecified, with other forms of angina pectoris: Secondary | ICD-10-CM

## 2018-09-19 DIAGNOSIS — I10 Essential (primary) hypertension: Secondary | ICD-10-CM

## 2018-09-19 DIAGNOSIS — E785 Hyperlipidemia, unspecified: Secondary | ICD-10-CM | POA: Diagnosis not present

## 2018-09-19 DIAGNOSIS — I5042 Chronic combined systolic (congestive) and diastolic (congestive) heart failure: Secondary | ICD-10-CM | POA: Diagnosis not present

## 2018-09-19 MED ORDER — ATORVASTATIN CALCIUM 80 MG PO TABS: ORAL_TABLET | ORAL | 3 refills | Status: DC

## 2018-09-19 MED ORDER — LISINOPRIL 10 MG PO TABS: 10.0000 mg | ORAL_TABLET | Freq: Every day | ORAL | 3 refills | Status: DC

## 2018-09-19 MED ORDER — CARVEDILOL 6.25 MG PO TABS: 6.2500 mg | ORAL_TABLET | Freq: Two times a day (BID) | ORAL | 3 refills | Status: DC

## 2018-09-19 MED ORDER — SPIRONOLACTONE 25 MG PO TABS: ORAL_TABLET | ORAL | 3 refills | Status: DC

## 2018-09-19 NOTE — Progress Notes (Signed)
SUBJECTIVE:  The patient presents for follow-up of coronary artery disease with a history of CABG as well as chronic combined systolic and diastolicheart failure.  Echocardiogram 10/02/16: Moderately reduced left ventricular systolic function, LVEF 65-68%, grade 1 diastolic dysfunction, moderate left atrial enlargement.  He told me he walks anywhere between 4 to 6 miles daily between working at the hospital and walking in the evening at home.  The patient denies any symptoms of chest pain, palpitations, shortness of breath, lightheadedness, dizziness, leg swelling, orthopnea, PND, and syncope.  He weighs himself daily.  He weighs between 228-230 pounds at home.  If his weight goes up by 3 to 5 pounds he takes a Lasix tablet.  He has had a do it once this year.  He monitors his fluid intake.  He tries to eat healthy.  ECG performed today which I ordered and personally to demonstrate sinus rhythm with old inferior and anterior infarct pattern.  A pack of cigarettes will last him 2 to 2-1/2 days.   Soc Hx:He works for the maintenance department at [employer]: As per "subjective", otherwise negative.  No Known Allergies  Current Outpatient Medications  Medication Sig Dispense Refill  . acetaminophen (TYLENOL) 500 MG tablet Take 1,000 mg by mouth every 6 (six) hours as needed for moderate pain or headache.    Marland Kitchen aspirin 81 MG EC tablet Take 1 tablet (81 mg total) by mouth daily. 30 tablet   . atorvastatin (LIPITOR) 80 MG tablet TAKE 1 TABLET BY MOUTH DAILY AT 6 PM. 90 tablet 1  . carvedilol (COREG) 6.25 MG tablet Take 1 tablet (6.25 mg total) by mouth 2 (two) times daily with a meal. 180 tablet 2  . lisinopril (PRINIVIL,ZESTRIL) 10 MG tablet TAKE 1 TABLET BY MOUTH AT BEDTIME. 90 tablet 1  . spironolactone (ALDACTONE) 25 MG tablet TAKE 1/2 TABLET (12.5 MG TOTAL) BY MOUTH DAILY 45 tablet 3   No current facility-administered medications for this visit.     Past Medical History:  Diagnosis Date  . Acute on chronic combined systolic and diastolic CHF, NYHA class 4 ([place]) 04/02/2016  . Acute systolic congestive heart failure ([place])   . Atrial fibrillation with rapid ventricular response ([place]) 12/13/2015   "had a heart monitor and can't find a source for the a. fib"  . Cardiomyopathy ([place]) 12/13/2015  . Cardiomyopathy, ischemic 12/14/2015   EF 20-25%  . Coronary artery disease involving native coronary artery with unstable angina pectoris ([place]) 12/15/2015  . Hypercholesterolemia 12/15/2015  . Mitral regurgitation 12/14/2015   moderate  . NSTEMI (non-ST elevated myocardial infarction) ([place])   . PONV (postoperative nausea and vomiting)    "sick with very first surgery but nothing since" in 1990s  . 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 Tobacco abuse     Past Surgical History:  Procedure Laterality Date  . CARDIAC CATHETERIZATION N/A 12/15/2015   Procedure: Right/Left Heart Cath and Coronary Angiography;  Surgeon: Wellington Hampshire, MD;  Location: [facility];  Service: Cardiovascular;  Laterality: N/A;  . CLIPPING OF ATRIAL APPENDAGE Left 12/17/2015   Procedure: CLIPPING OF ATRIAL APPENDAGE;  Surgeon: [name], MD;  Location: [facility];  Service: Open Heart Surgery;  Laterality: Left;  . COLONOSCOPY WITH PROPOFOL N/A 06/21/2016   Procedure: COLONOSCOPY WITH PROPOFOL;  Surgeon: Manus Gunning, MD;  Location: [facility];  Service: Gastroenterology;  Laterality: N/A;  . CORONARY ARTERY BYPASS GRAFT N/A 12/17/2015   Procedure: CORONARY ARTERY BYPASS GRAFTING (CABG) x three , using left internal mammary artery and right leg greater saphenous vein;  Surgeon: [name], MD;  Location: [facility];  Service: Open Heart Surgery;  Laterality: N/A;  . ENDARTERECTOMY Left 02/09/2016   Procedure: LEFT CAROTID ENDARTERECTOMY;  Surgeon: Serafina Mitchell, MD;  Location: [facility];  Service: Vascular;  Laterality: Left;  . KNEE ARTHROSCOPY Right 1991  . PATCH ANGIOPLASTY Left 02/09/2016   Procedure: PATCH ANGIOPLASTY USING Rueben Bash BIOLOGIC PATCH;  Surgeon: Serafina Mitchell, MD;  Location: [facility];  Service: Vascular;  Laterality: Left;    Social History   Socioeconomic History  . Marital status: Married    Spouse name: Not on file  . Number of children: 2  . Years of education: Not on file  . Highest education level: Not on file  Occupational History  . Occupation: Charity fundraiser: [employer]    Comment: [employer]  . Financial resource strain: Not on file  . Food insecurity:    Worry: Not on file    Inability: Not on file  . Transportation needs:    Medical: Not on file    Non-medical: Not on file  Tobacco Use  . Smoking status: Former Smoker    Packs/day: 1.00    Years: 35.00    Pack years: 35.00    Types: Cigarettes    Last attempt to quit: 12/13/2015    Years since quitting: 2.7  . Smokeless tobacco: Former Systems developer    Quit date: 12/13/2015  Substance and Sexual Activity  . Alcohol use: Yes    Alcohol/week: 0.0 standard drinks    Comment: rare  . Drug use: No  . Sexual activity: Yes  Lifestyle  . Physical activity:    Days per week: Not on file    Minutes per session: Not on file  . Stress: Not on file  Relationships  . Social connections:    Talks on phone: Not on file    Gets together: Not on file    Attends religious service: Not on file    Active member of club or organization: Not on file    Attends meetings of clubs or organizations: Not on file    Relationship status: Not on file  . Intimate partner violence:    Fear of current or ex partner: Not on file    Emotionally abused: Not on file    Physically abused: Not on file    Forced sexual activity: Not on file  Other Topics Concern  . Not on file  Social History Narrative  . Not on file     Vitals:   09/19/18 1044  BP: (!)  156/101  Pulse: 75  Temp: (!) 97.4 F (36.3 C)  TempSrc: Oral  SpO2: 92%  Weight: 235 lb (106.6 kg)  Height: 6' (1.829 m)    Wt Readings from Last 3 Encounters:  09/19/18 235 lb (106.6 kg)  05/17/18 237 lb 9.6 oz (107.8 kg)  01/30/18 241 lb 12.8 oz (109.7 kg)     PHYSICAL EXAM General: NAD HEENT: Normal. Neck: No JVD, no thyromegaly. Lungs: Diminished breath sounds throughout, no crackles or wheezes. CV: Regular rate and rhythm, normal S1/S2, no S3/S4, no murmur. No pretibial or periankle edema.  No carotid bruit.   Abdomen: Soft,  nontender, no distention.  Neurologic: Alert and oriented.  Psych: Normal affect. Skin: Normal. Musculoskeletal: No gross deformities.    ECG: Reviewed above under Subjective   Labs: Lab Results  Component Value Date/Time   K 4.6 06/21/2017 07:40 AM   BUN 18 06/21/2017 07:40 AM   CREATININE 1.11 06/21/2017 07:40 AM   ALT 15 (L) 06/21/2017 07:40 AM   TSH 2.921 06/29/2016 10:35 AM   HGB 15.2 04/03/2016 08:12 AM     Lipids: Lab Results  Component Value Date/Time   LDLCALC 51 06/21/2017 07:39 AM   CHOL 97 06/21/2017 07:39 AM   TRIG 58 06/21/2017 07:39 AM   HDL 34 (L) 06/21/2017 07:39 AM       ASSESSMENT AND PLAN:  1. Coronary artery disease with a history of CABG: Symptomatically stable. Continue aspirin, Lipitor, lisinopril and carvedilol.  2. Chronic combined systolic and diastolic heart failure: Symptomatically stable. Continue carvedilol, spironolactone, and lisinopril.  He is no longer taking Entresto.  He weighs himself every morning.  He monitors his fluid intake.  He takes Lasix as needed.  3. Essential hypertension:  Blood pressure is markedly elevated.  He appears to have whitecoat hypertension.  He checks his blood pressure every night at home and it ranges 128-135/78-86.  4. Peripheral vascular disease with a history of left carotid endarterectomy: 11/20/16 Dopplers showed less than 40% right internal carotid artery  stenosis and a patent left carotid endarterectomy. Continue aspirin and statin.  5.  Hyperlipidemia: Lipids due to be checked in near future.  Continue atorvastatin 80 mg.  6.  Tobacco use: A pack of cigarettes will last him 2.5 days.    Disposition: Follow up 1 year.   Kate Sable, M.D., F.A.C.C.

## 2018-09-19 NOTE — Patient Instructions (Signed)
Medication Instructions:  Your physician recommends that you continue on your current medications as directed. Please refer to the Current Medication list given to you today.  If you need a refill on your cardiac medications before your next appointment, please call your pharmacy.   Lab work: NONE  If you have labs (blood work) drawn today and your tests are completely normal, you will receive your results only by: . MyChart Message (if you have MyChart) OR . A paper copy in the mail If you have any lab test that is abnormal or we need to change your treatment, we will call you to review the results.  Testing/Procedures: NONE   Follow-Up: At [facility], you and your health needs are our priority.  As part of our continuing mission to provide you with exceptional heart care, we have created designated Provider Care Teams.  These Care Teams include your primary Cardiologist (physician) and Advanced Practice Providers (APPs -  Physician Assistants and Nurse Practitioners) who all work together to provide you with the care you need, when you need it. You will need a follow up appointment in 1 years.  Please call our office 2 months in advance to schedule this appointment.  You may see No primary care provider on file. or one of the following Advanced Practice Providers on your designated Care Team:   Brittany Strader, PA-C ([place] Office) . Michele Lenze, PA-C ([place] [facility])  Any Other Special Instructions Will Be Listed Below (If Applicable). Thank you for choosing [facility]!     

## 2018-09-24 ENCOUNTER — Telehealth: Payer: Self-pay | Admitting: Cardiovascular Disease

## 2018-09-24 ENCOUNTER — Other Ambulatory Visit: Payer: Self-pay

## 2018-09-24 MED ORDER — CARVEDILOL 6.25 MG PO TABS: 6.2500 mg | ORAL_TABLET | Freq: Two times a day (BID) | ORAL | 3 refills | Status: DC

## 2018-09-24 MED ORDER — LISINOPRIL 10 MG PO TABS: 10.0000 mg | ORAL_TABLET | Freq: Every day | ORAL | 3 refills | Status: DC

## 2018-09-24 MED ORDER — SPIRONOLACTONE 25 MG PO TABS: ORAL_TABLET | ORAL | 3 refills | Status: DC

## 2018-09-24 NOTE — Telephone Encounter (Signed)
Please call pharmacy in regards to patient's meds that were called in on 3/19. / tg

## 2018-09-24 NOTE — Telephone Encounter (Signed)
error 

## 2019-05-16 DIAGNOSIS — H5213 Myopia, bilateral: Secondary | ICD-10-CM | POA: Diagnosis not present

## 2019-08-06 ENCOUNTER — Encounter: Payer: Self-pay | Admitting: Family Medicine

## 2019-09-22 ENCOUNTER — Other Ambulatory Visit: Payer: Self-pay

## 2019-09-22 MED ORDER — SPIRONOLACTONE 25 MG PO TABS: ORAL_TABLET | ORAL | 3 refills | Status: DC

## 2019-09-22 NOTE — Telephone Encounter (Signed)
refilled aldactone 25 mg #45

## 2019-11-05 ENCOUNTER — Other Ambulatory Visit: Payer: Self-pay | Admitting: Cardiovascular Disease

## 2019-12-19 ENCOUNTER — Other Ambulatory Visit: Payer: Self-pay

## 2019-12-19 MED ORDER — LISINOPRIL 10 MG PO TABS: 10.0000 mg | ORAL_TABLET | Freq: Every day | ORAL | 3 refills | Status: DC

## 2019-12-19 NOTE — Telephone Encounter (Signed)
Refilled lisinopril

## 2020-02-05 ENCOUNTER — Other Ambulatory Visit: Payer: Self-pay

## 2020-02-05 ENCOUNTER — Encounter: Payer: Self-pay | Admitting: Family Medicine

## 2020-02-05 ENCOUNTER — Ambulatory Visit: Payer: 59 | Admitting: Family Medicine

## 2020-02-05 VITALS — BP 172/96 | Temp 98.1°F | Wt 232.8 lb

## 2020-02-05 DIAGNOSIS — B309 Viral conjunctivitis, unspecified: Secondary | ICD-10-CM

## 2020-02-05 DIAGNOSIS — Z1211 Encounter for screening for malignant neoplasm of colon: Secondary | ICD-10-CM | POA: Diagnosis not present

## 2020-02-05 DIAGNOSIS — Z125 Encounter for screening for malignant neoplasm of prostate: Secondary | ICD-10-CM | POA: Diagnosis not present

## 2020-02-05 DIAGNOSIS — E7800 Pure hypercholesterolemia, unspecified: Secondary | ICD-10-CM | POA: Diagnosis not present

## 2020-02-05 DIAGNOSIS — Z79899 Other long term (current) drug therapy: Secondary | ICD-10-CM | POA: Diagnosis not present

## 2020-02-05 MED ORDER — SULFACETAMIDE SODIUM 10 % OP SOLN: 2.0000 [drp] | Freq: Four times a day (QID) | OPHTHALMIC | 0 refills | Status: DC

## 2020-02-05 NOTE — Progress Notes (Signed)
   Subjective:    Patient ID: [name] [name], male    DOB: 1960-08-23, 59 y.o.   MRN: [id number]  HPI Patient comes in today with complaints of left eye irritation, redness and swelling since Tuesday.    Patient reports discharge causing eye to be matted shut this AM.  Relates the eye crusting together Denies blurred vision Denies pain Patient is due for labs and for a wellness exam Denies fever chills cough wheezing sinus symptoms  Review of Systems Please see above    Objective:   Physical Exam  Lungs clear respiratory rate normal heart regular left eye conjunctivitis noted      Assessment & Plan:  Conjunctivitis Treatment with drops If progressive troubles or worse follow-up Wellness exam recommended

## 2020-04-08 ENCOUNTER — Other Ambulatory Visit (HOSPITAL_COMMUNITY)
Admission: RE | Admit: 2020-04-08 | Discharge: 2020-04-08 | Disposition: A | Discharge status: Home / Self Care | Payer: 59 | Source: Ambulatory Visit | Attending: Family Medicine | Admitting: Family Medicine

## 2020-04-08 ENCOUNTER — Other Ambulatory Visit: Payer: Self-pay

## 2020-04-08 DIAGNOSIS — E7800 Pure hypercholesterolemia, unspecified: Secondary | ICD-10-CM | POA: Insufficient documentation

## 2020-04-08 DIAGNOSIS — Z79899 Other long term (current) drug therapy: Secondary | ICD-10-CM | POA: Diagnosis not present

## 2020-04-08 DIAGNOSIS — Z125 Encounter for screening for malignant neoplasm of prostate: Secondary | ICD-10-CM | POA: Insufficient documentation

## 2020-04-08 LAB — BASIC METABOLIC PANEL
Anion gap: 9 (ref 5–15)
BUN: 12 mg/dL (ref 6–20)
CO2: 27 mmol/L (ref 22–32)
Calcium: 9 mg/dL (ref 8.9–10.3)
Chloride: 102 mmol/L (ref 98–111)
Creatinine, Ser: 0.9 mg/dL (ref 0.61–1.24)
GFR calc non Af Amer: >60 mL/min (ref >60)
Glucose, Bld: 111 mg/dL — ABNORMAL HIGH (ref 70–99)
Potassium: 4.7 mmol/L (ref 3.5–5.1)
Sodium: 138 mmol/L (ref 135–145)

## 2020-04-08 LAB — HEPATIC FUNCTION PANEL
ALT: 16 U/L (ref 0–44)
AST: 16 U/L (ref 15–41)
Albumin: 3.9 g/dL (ref 3.5–5.0)
Alkaline Phosphatase: 112 U/L (ref 38–126)
Bilirubin, Direct: 0.2 mg/dL (ref 0.0–0.2)
Indirect Bilirubin: 1 mg/dL — ABNORMAL HIGH (ref 0.3–0.9)
Total Bilirubin: 1.2 mg/dL (ref 0.3–1.2)
Total Protein: 7.1 g/dL (ref 6.5–8.1)

## 2020-04-08 LAB — LIPID PANEL
Cholesterol: 116 mg/dL (ref 0–200)
HDL: 31 mg/dL — ABNORMAL LOW (ref >40)
LDL Cholesterol: 67 mg/dL (ref 0–99)
Total CHOL/HDL Ratio: 3.7 RATIO
Triglycerides: 92 mg/dL (ref <150)
VLDL: 18 mg/dL (ref 0–40)

## 2020-04-08 LAB — PSA: Prostatic Specific Antigen: 0.44 ng/mL (ref 0.00–4.00)

## 2020-05-07 ENCOUNTER — Ambulatory Visit: Payer: 59 | Admitting: Student

## 2020-05-17 DIAGNOSIS — H52223 Regular astigmatism, bilateral: Secondary | ICD-10-CM | POA: Diagnosis not present

## 2020-05-17 DIAGNOSIS — H5213 Myopia, bilateral: Secondary | ICD-10-CM | POA: Diagnosis not present

## 2020-05-20 ENCOUNTER — Other Ambulatory Visit: Payer: Self-pay | Admitting: Student

## 2020-05-20 ENCOUNTER — Other Ambulatory Visit: Payer: Self-pay

## 2020-05-20 ENCOUNTER — Ambulatory Visit (INDEPENDENT_AMBULATORY_CARE_PROVIDER_SITE_OTHER): Payer: 59 | Admitting: Student

## 2020-05-20 ENCOUNTER — Encounter: Payer: Self-pay | Admitting: Student

## 2020-05-20 VITALS — BP 124/88 | HR 80 | Ht 72.0 in | Wt 230.0 lb

## 2020-05-20 DIAGNOSIS — I10 Essential (primary) hypertension: Secondary | ICD-10-CM | POA: Diagnosis not present

## 2020-05-20 DIAGNOSIS — E785 Hyperlipidemia, unspecified: Secondary | ICD-10-CM

## 2020-05-20 DIAGNOSIS — I5042 Chronic combined systolic (congestive) and diastolic (congestive) heart failure: Secondary | ICD-10-CM | POA: Diagnosis not present

## 2020-05-20 DIAGNOSIS — I6523 Occlusion and stenosis of bilateral carotid arteries: Secondary | ICD-10-CM | POA: Diagnosis not present

## 2020-05-20 DIAGNOSIS — I2510 Atherosclerotic heart disease of native coronary artery without angina pectoris: Secondary | ICD-10-CM | POA: Diagnosis not present

## 2020-05-20 MED ORDER — LISINOPRIL 10 MG PO TABS: 10.0000 mg | ORAL_TABLET | Freq: Every day | ORAL | 3 refills | Status: DC

## 2020-05-20 MED ORDER — CARVEDILOL 6.25 MG PO TABS: 6.2500 mg | ORAL_TABLET | Freq: Two times a day (BID) | ORAL | 3 refills | Status: DC

## 2020-05-20 MED ORDER — ATORVASTATIN CALCIUM 80 MG PO TABS: 80.0000 mg | ORAL_TABLET | Freq: Every day | ORAL | 3 refills | Status: DC

## 2020-05-20 MED ORDER — SPIRONOLACTONE 25 MG PO TABS: ORAL_TABLET | ORAL | 3 refills | Status: DC

## 2020-05-20 NOTE — Progress Notes (Signed)
Cardiology Office Note    Date:  05/20/2020   ID:  Wilferd, Ritson December 03, 1960, MRN [id number]  PCP:  Mikey Kirschner, MD (Inactive)  Cardiologist: Previously Dr. Bronson Ing --> Will switch to new MD at next visit   Chief Complaint  Patient presents with  . Follow-up    Annual Visit    History of Present Illness:    [name] [name] is a 59 y.o. male with past medical history of CAD (s/p CABG in 12/2015 with LIMA-LAD, SVG-OM and SVG-PDA), chronic combined systolic and diastolic CHF/ICM (EF 73-22% in 12/2015, at 35-40% by repeat echo in 10/2016), carotid artery stenosis (s/p L CEA in 02/2016), HTN and HLD who presents to the office today for annual follow-up.   He was last examined by Dr. Bronson Ing in 09/2018 and reported walking over 4-6 miles each day and denied any anginal symptoms with this. He was no longer taking Entresto secondary to cost and was continued on ASA 81mg  daily, Atorvastatin 80mg  daily, Coreg 6.25mg  BID, Lisinopril 10mg  daily and Spironolactone 12.5mg  daily.   In talking the patient today, he reports overall doing well since his last visit. 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 He denies any recent chest pain or dyspnea on exertion. No recent orthopnea, PND, lower extremity edema or palpitations.  He does follow daily weights at home and says this has overall been stable. He has not followed up with Vascular Surgery since 2018 as he says he has done well.    Past Medical History:  Diagnosis Date  . Acute systolic congestive heart failure ([place])   . Atrial fibrillation with rapid ventricular response ([place]) 12/13/2015   "had a heart monitor and can't find a source for the a. fib"  . Cardiomyopathy ([place]) 12/13/2015  . Cardiomyopathy, ischemic 12/14/2015   a. EF 20-25% in 12/2015 b. EF at 35-40% by repeat echo in 10/2016  . Coronary artery disease involving native coronary  artery with unstable angina pectoris ([place]) 12/15/2015  . Hypercholesterolemia 12/15/2015  . Mitral regurgitation 12/14/2015   moderate  . NSTEMI (non-ST elevated myocardial infarction) ([place])   . PONV (postoperative nausea and vomiting)    "sick with very first surgery but nothing since" in 1990s  . 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 Tobacco abuse     Past Surgical History:  Procedure Laterality Date  . CARDIAC CATHETERIZATION N/A 12/15/2015   Procedure: Right/Left Heart Cath and Coronary Angiography;  Surgeon: Wellington Hampshire, MD;  Location: [facility];  Service: Cardiovascular;  Laterality: N/A;  . CLIPPING OF ATRIAL APPENDAGE Left 12/17/2015   Procedure: CLIPPING OF ATRIAL APPENDAGE;  Surgeon: [name], MD;  Location: [facility];  Service: Open Heart Surgery;  Laterality: Left;  . COLONOSCOPY WITH PROPOFOL N/A 06/21/2016   Procedure: COLONOSCOPY WITH PROPOFOL;  Surgeon: Manus Gunning, MD;  Location: [facility];  Service: Gastroenterology;  Laterality: N/A;  . CORONARY ARTERY BYPASS GRAFT N/A 12/17/2015   Procedure: CORONARY ARTERY BYPASS GRAFTING (CABG) x three , using left internal mammary artery and right leg greater saphenous vein;  Surgeon: [name], MD;  Location: [facility];  Service: Open Heart Surgery;  Laterality: N/A;  . ENDARTERECTOMY Left 02/09/2016   Procedure: LEFT CAROTID ENDARTERECTOMY;  Surgeon: Serafina Mitchell, MD;  Location: [facility];  Service: Vascular;  Laterality: Left;  . KNEE ARTHROSCOPY Right 1991  . PATCH ANGIOPLASTY Left 02/09/2016   Procedure: PATCH ANGIOPLASTY USING Rueben Bash BIOLOGIC PATCH;  Surgeon: Serafina Mitchell, MD;  Location: [facility];  Service: Vascular;  Laterality: Left;    Current Medications: Outpatient Medications Prior to Visit  Medication Sig Dispense Refill  . aspirin 81 MG EC tablet Take 1 tablet (81 mg total) by mouth  daily. 30 tablet   . atorvastatin (LIPITOR) 80 MG tablet TAKE 1 TABLET BY MOUTH DAILY AT 6 PM. 90 tablet 1  . carvedilol (COREG) 6.25 MG tablet TAKE 1 TABLET (6.25 MG TOTAL) BY MOUTH 2 TIMES DAILY WITH A MEAL. 180 tablet 1  . lisinopril (ZESTRIL) 10 MG tablet Take 1 tablet (10 mg total) by mouth at bedtime. 90 tablet 3  . spironolactone (ALDACTONE) 25 MG tablet TAKE 1/2 TABLET (12.5 MG TOTAL) BY MOUTH DAILY 45 tablet 3  . acetaminophen (TYLENOL) 500 MG tablet Take 1,000 mg by mouth every 6 (six) hours as needed for moderate pain or headache. (Patient not taking: Reported on 02/05/2020)    . sulfacetamide (BLEPH-10) 10 % ophthalmic solution Place 2 drops into the left eye 4 (four) times daily. 5 mL 0   No facility-administered medications prior to visit.     Allergies:   Patient has no known allergies.   Social History   Socioeconomic History  . Marital status: Married    Spouse name: Not on file  . Number of children: 2  . Years of education: Not on file  . Highest education level: Not on file  Occupational History  . Occupation: Charity fundraiser: [employer]    Comment: [employer]  Tobacco Use  . Smoking status: Former Smoker    Packs/day: 1.00    Years: 35.00    Pack years: 35.00    Types: Cigarettes    Quit date: 12/13/2015    Years since quitting: 4.4  . Smokeless tobacco: Former Systems developer    Quit date: 12/13/2015  Substance and Sexual Activity  . Alcohol use: Yes    Alcohol/week: 0.0 standard drinks    Comment: rare  . Drug use: No  . Sexual activity: Yes  Other Topics Concern  . Not on file  Social History Narrative  . Not on file   Social Determinants of Health   Financial Resource Strain:   . Difficulty of Paying Living Expenses: Not on file  Food Insecurity:   . Worried About Charity fundraiser in the Last Year: Not on file  . Ran Out of Food in the Last Year: Not on file  Transportation Needs:   . Lack of Transportation (Medical): Not on file  . Lack of  Transportation (Non-Medical): Not on file  Physical Activity:   . Days of Exercise per Week: Not on file  . Minutes of Exercise per Session: Not on file  Stress:   . Feeling of Stress : Not on file  Social Connections:   . Frequency of Communication with Friends and Family: Not on file  . Frequency of Social Gatherings with Friends and Family: Not on file  . Attends Religious Services: Not on file  . Active Member of Clubs or Organizations: Not on file  . Attends Archivist Meetings: Not on file  . Marital Status: Not on file     Family History:  The patient's family history includes Atrial fibrillation  in his father; Heart attack in his paternal grandfather; Heart failure in his father.   Review of Systems:   Please see the history of present illness.     General:  No chills, fever, night sweats or weight changes.  Cardiovascular:  No chest pain, dyspnea on exertion, edema, orthopnea, palpitations, paroxysmal nocturnal dyspnea.  Dermatological: No rash. Positive for easy bruising.  Respiratory: No cough, dyspnea Urologic: No hematuria, dysuria Abdominal:   No nausea, vomiting, diarrhea, bright red blood per rectum, melena, or hematemesis Neurologic:  No visual changes, wkns, changes in mental status. All other systems reviewed and are otherwise negative except as noted above.   Physical Exam:    VS:  BP 124/88   Pulse 80   Ht 6' (1.829 m)   Wt 230 lb (104.3 kg)   SpO2 96%   BMI 31.19 kg/m    General: Well developed, well nourished,male appearing in no acute distress. Head: Normocephalic, atraumatic. Neck: No carotid bruits. JVD not elevated.  Lungs: Respirations regular and unlabored, without wheezes or rales.  Heart: Regular rate and rhythm. No S3 or S4.  No murmur, no rubs, or gallops appreciated. Abdomen: Appears non-distended. No obvious abdominal masses. Msk:  Strength and tone appear normal for age. No obvious joint deformities or effusions. Extremities:  No clubbing or cyanosis. No lower extremity edema.  Distal pedal pulses are 2+ bilaterally. Neuro: Alert and oriented X 3. Moves all extremities spontaneously. No focal deficits noted. Psych:  Responds to questions appropriately with a normal affect. Skin: No rashes or lesions noted  Wt Readings from Last 3 Encounters:  05/20/20 230 lb (104.3 kg)  02/05/20 232 lb 12.8 oz (105.6 kg)  09/19/18 235 lb (106.6 kg)     Studies/Labs Reviewed:   EKG:  EKG is ordered today. The ekg ordered today demonstrates NSR, HR 76 with inferior and anterior infarct pattern. No acute ST changes when compared to prior tracings.   Recent Labs: 04/08/2020: ALT 16; BUN 12; Creatinine, Ser 0.90; Potassium 4.7; Sodium 138   Lipid Panel    Component Value Date/Time   CHOL 116 04/08/2020 0806   TRIG 92 04/08/2020 0806   HDL 31 (L) 04/08/2020 0806   CHOLHDL 3.7 04/08/2020 0806   VLDL 18 04/08/2020 0806   LDLCALC 67 04/08/2020 0806    Additional studies/ records that were reviewed today include:   Cardiac Catheterization: 12/2015  There is moderate to severe left ventricular systolic dysfunction.  Ost LM to LM lesion, 70% stenosed.  Ost RCA lesion, 100% stenosed.  Mid LAD lesion, 100% stenosed.  Prox LAD to Mid LAD lesion, 90% stenosed.  Ost Cx to Prox Cx lesion, 70% stenosed.  Mid Cx lesion, 80% stenosed.   1. Significant left main and severe three-vessel coronary artery disease with ostial occlusion of the right coronary artery and occlusion of the mid LAD. 2. Moderately to severely reduced LV systolic function with an ejection fraction of 30-35%. 3. Right heart catheterization showed mild pulmonary hypertension, moderately elevated filling pressures and normal cardiac output.  Recommendations: CABG. I left a message to cardiothoracic surgery. Please note that there was severe pressure dampening every time I engaged the left main coronary artery with PVCs on the monitor. Thus, I only took 3  shots. The right coronary artery appears to be flush occluded at the ostium and could not be engaged. Collaterals are seen from the left side all the way back to the proximal RCA. The LAD is occluded in the midsegment with  only faint collaterals and might not be suitable for grafting. However, there are good targets in the diagonal, left circumflex and right coronary artery.  Echocardiogram: 10/2016 Study Conclusions   - Left ventricle: Septal apical mid and basal inferior wall  hypokinesis The cavity size was moderately dilated. Systolic  function was moderately reduced. The estimated ejection fraction  was in the range of 35% to 40%. Doppler parameters are consistent  with abnormal left ventricular relaxation (grade 1 diastolic  dysfunction).  - Left atrium: The atrium was moderately dilated.  - Atrial septum: No defect or patent foramen ovale was identified.  Assessment:    1. Coronary artery disease involving native coronary artery of native heart without angina pectoris   2. Chronic combined systolic and diastolic congestive heart failure ([place])   3. Essential hypertension   4. Hyperlipidemia LDL goal <70   5. Asymptomatic bilateral carotid artery stenosis      Plan:   In order of problems listed above:  1. CAD - He is s/p CABG in 12/2015 with LIMA-LAD, SVG-OM and SVG-PDA. He denies any recent chest pain or dyspnea on exertion and is active at baseline.  - Continue current medication regimen with ASA 81mg  daily (says he takes this 3-4 days per week due to easy bruising), Coreg 6.25mg  BID and Atorvastatin 80mg  daily.   2. Chronic Combined Systolic and Diastolic CHF/ICM - His EF was previously reduced at 20-25% in 12/2015, at 35-40% by repeat echo in 10/2016. We discussed obtaining a repeat echocardiogram given the time frame since his last study and he prefers to plan for this next year.  - No recent orthopnea, PND or edema and he appears euvolemic at this time. -  Continue Coreg 6.25mg  BID, Lisinopril 10mg  daily and Spironolactone 12.5mg  daily. Delene Loll was previously unaffordable.   3. HTN - BP is at 124/88 during today's visit and he reports it has been well-controlled when checked at home. Continue current medication regimen.   4. HLD - FLP in 04/2020 showed total cholesterol of 116, HDL 31 and LDL 67. LDL at goal of less than 70. Continue Atorvastatin 80mg  daily.   5. Carotid Artery Stenosis - He is s/p L CEA in 02/2016 and his last visit with Vascular Surgery was in 2018. Will plan to arrange for repeat carotid dopplers at the time of his echo next year. Continue ASA and statin therapy.     Medication Adjustments/Labs and Tests Ordered: Current medicines are reviewed at length with the patient today.  Concerns regarding medicines are outlined above.  Medication changes, Labs and Tests ordered today are listed in the Patient Instructions below. Patient Instructions  Medication Instructions:  Your physician recommends that you continue on your current medications as directed. Please refer to the Current Medication list given to you today.  *If you need a refill on your cardiac medications before your next appointment, please call your pharmacy*   Lab Work: NONE   If you have labs (blood work) drawn today and your tests are completely normal, you will receive your results only by: Marland Kitchen MyChart Message (if you have MyChart) OR . A paper copy in the mail If you have any lab test that is abnormal or we need to change your treatment, we will call you to review the results.   Testing/Procedures: NONE    Follow-Up: At [facility], you and your health needs are our priority.  As part of our continuing mission to provide you with exceptional heart care, we have  created designated Provider Care Teams.  These Care Teams include your primary Cardiologist (physician) and Advanced Practice Providers (APPs -  Physician Assistants and Nurse  Practitioners) who all work together to provide you with the care you need, when you need it.  We recommend signing up for the patient portal called "MyChart".  Sign up information is provided on this After Visit Summary.  MyChart is used to connect with patients for Virtual Visits (Telemedicine).  Patients are able to view lab/test results, encounter notes, upcoming appointments, etc.  Non-urgent messages can be sent to your provider as well.   To learn more about what you can do with MyChart, go to [email or web address].    Your next appointment:   1 year(s)  The format for your next appointment:   In Person  Provider:   With MD    Other Instructions Thank you for choosing [facility]!       Signed, Erma Heritage, PA-C  05/20/2020 7:58 PM    [place] [address] [place], [place] [address] Phone: [phone] Fax: [phone]

## 2020-05-20 NOTE — Patient Instructions (Signed)

## 2020-11-07 MED FILL — Atorvastatin Calcium Tab 80 MG (Base Equivalent): ORAL | 90 days supply | Qty: 90 | Fill #0 | Status: AC

## 2020-11-07 MED FILL — Spironolactone Tab 25 MG: ORAL | 90 days supply | Qty: 45 | Fill #0 | Status: AC

## 2020-11-08 ENCOUNTER — Other Ambulatory Visit (HOSPITAL_COMMUNITY): Payer: Self-pay

## 2020-11-09 ENCOUNTER — Other Ambulatory Visit (HOSPITAL_COMMUNITY): Payer: Self-pay

## 2020-11-09 MED FILL — Carvedilol Tab 6.25 MG: ORAL | 90 days supply | Qty: 180 | Fill #0 | Status: AC

## 2020-11-09 MED FILL — Lisinopril Tab 10 MG: ORAL | 90 days supply | Qty: 90 | Fill #0 | Status: AC

## 2020-12-08 ENCOUNTER — Other Ambulatory Visit (HOSPITAL_COMMUNITY): Payer: Self-pay

## 2021-01-17 ENCOUNTER — Telehealth: Payer: 59 | Admitting: Physician Assistant

## 2021-01-17 DIAGNOSIS — B029 Zoster without complications: Secondary | ICD-10-CM | POA: Diagnosis not present

## 2021-01-18 MED ORDER — VALACYCLOVIR HCL 1 G PO TABS: 1000.0000 mg | ORAL_TABLET | Freq: Three times a day (TID) | ORAL | 0 refills | Status: AC

## 2021-01-18 MED ORDER — GABAPENTIN 300 MG PO CAPS: 300.0000 mg | ORAL_CAPSULE | Freq: Two times a day (BID) | ORAL | 0 refills | Status: DC

## 2021-01-18 NOTE — Progress Notes (Signed)
I have spent 5 minutes in review of e-visit questionnaire, review and updating patient chart, medical decision making and response to patient.   Jerimy Johanson Cody Dannica Bickham, PA-C    

## 2021-01-18 NOTE — Progress Notes (Signed)
E-visit for Shingles   We are sorry that you are not feeling well. Here is how we plan to help!  Based on what you shared with me it looks like you have shingles.  Shingles or herpes zoster, is a common infection of the nerves.  It is a painful rash caused by the herpes zoster virus.  This is the same virus that causes chickenpox.  After a person has chickenpox, the virus remains inactive in the nerve cells.  Years later, the virus can become active again and travel to the skin.  It typically will appear on one side of the face or body.  Burning or shooting pain, tingling, or itching are early signs of the infection.  Blisters typically scab over in 7 to 10 days and clear up within 2-4 weeks. Shingles is only contagious to people that have never had the chickenpox, the chickenpox vaccine, or anyone who has a compromised immune system.  You should avoid contact with these type of people until your blisters scab over.  I have prescribed Valacyclovir 1g three times daily for 7 days and also Gabapentin 300mg  twice daily as needed for pain. This medication can make you sleepy so avoid any driving until you adjust to the medication.    HOME CARE: Apply ice packs (wrapped in a thin towel), cool compresses, or soak in cool bath to help reduce pain. Use calamine lotion to calm itchy skin. Avoid scratching the rash. Avoid direct sunlight.  GET HELP RIGHT AWAY IF: Symptoms that don't away after treatment. A rash or blisters near your eye. Increased drainage, fever, or rash after treatment. Severe pain that doesn't go away.   MAKE SURE YOU   Understand these instructions. Will watch your condition. Will get help right away if you are not doing well or get worse.  Thank you for choosing an e-visit.  Your e-visit answers were reviewed by a board certified advanced clinical practitioner to complete your personal care plan. Depending upon the condition, your plan could have included both over the  counter or prescription medications.  Please review your pharmacy choice. Make sure the pharmacy is open so you can pick up prescription now. If there is a problem, you may contact your provider through [employer] and have the prescription routed to another pharmacy.  Your safety is important to [place]. If you have drug allergies check your prescription carefully.   For the next 24 hours you can use MyChart to ask questions about today's visit, request a non-urgent call back, or ask for a work or school excuse. You will get an email in the next two days asking about your experience. I hope that your e-visit has been valuable and will speed your recovery.

## 2021-01-27 ENCOUNTER — Telehealth: Payer: 59 | Admitting: Physician Assistant

## 2021-01-27 DIAGNOSIS — B029 Zoster without complications: Secondary | ICD-10-CM

## 2021-01-27 DIAGNOSIS — B0229 Other postherpetic nervous system involvement: Secondary | ICD-10-CM

## 2021-01-27 NOTE — Progress Notes (Signed)
Based on what you shared with me, I feel your condition warrants further evaluation and I recommend that you be seen in a face to face visit. I recommend giving severity of pain that you be evaluated in person so we can get a good control of this and make sure no further evaluation is needed. Tingling is common in areas affected by shingles. Numbness can occur but is less common. The concern now is that you are developing postherpetic neuralgia after shingles infection which can be short-lived but can also persist for several weeks so I recommend an evaluation ASAP.    NOTE: There will be NO CHARGE for this eVisit   If you are having a true medical emergency please call 911.      For an urgent face to face visit, [facility] has six urgent care centers for your convenience:     [facility] [place] [facility] [place] Get Driving Directions [id number] [address] [place], [place] [address]    [facility]) Get Driving Directions [id number] [place], [place] [address]  [facility] [place] [facility]) Get Driving Directions [id number] [address] [place],  [place]  [address]  [facility]  Get Driving Directions [id number] [place] [address] [place], [place], [place] [address]   [facility] [place] Get Driving Directions  [id number] [address], [place] [address]   [facility] [place] Get Driving Directions [id number] [address] [place], [place] [address]  Your MyChart E-visit questionnaire answers were reviewed by a board certified advanced clinical practitioner to complete your personal care plan based on your specific symptoms.  Thank you for using e-Visits.

## 2021-01-28 ENCOUNTER — Ambulatory Visit: Payer: 59 | Admitting: Family Medicine

## 2021-01-28 ENCOUNTER — Other Ambulatory Visit: Payer: Self-pay

## 2021-01-28 ENCOUNTER — Ambulatory Visit (HOSPITAL_COMMUNITY)
Admission: RE | Admit: 2021-01-28 | Discharge: 2021-01-28 | Disposition: A | Discharge status: Home / Self Care | Payer: 59 | Source: Ambulatory Visit | Attending: Family Medicine | Admitting: Family Medicine

## 2021-01-28 VITALS — BP 140/92 | Temp 97.3°F | Wt 227.0 lb

## 2021-01-28 DIAGNOSIS — J019 Acute sinusitis, unspecified: Secondary | ICD-10-CM | POA: Diagnosis not present

## 2021-01-28 DIAGNOSIS — R059 Cough, unspecified: Secondary | ICD-10-CM

## 2021-01-28 DIAGNOSIS — B9689 Other specified bacterial agents as the cause of diseases classified elsewhere: Secondary | ICD-10-CM

## 2021-01-28 DIAGNOSIS — B0229 Other postherpetic nervous system involvement: Secondary | ICD-10-CM | POA: Diagnosis not present

## 2021-01-28 DIAGNOSIS — B029 Zoster without complications: Secondary | ICD-10-CM | POA: Diagnosis not present

## 2021-01-28 MED ORDER — GABAPENTIN 300 MG PO CAPS: ORAL_CAPSULE | ORAL | 3 refills | Status: DC

## 2021-01-28 MED ORDER — AMOXICILLIN-POT CLAVULANATE 875-125 MG PO TABS: ORAL_TABLET | ORAL | 0 refills | Status: DC

## 2021-01-28 NOTE — Progress Notes (Addendum)
   Subjective:    Patient ID: [name] [name], male    DOB: Sep 24, 1960, 60 y.o.   MRN: [id number]  HPI Pt has rash on left side of stomach that goes to side and back. Pt did e-visit on 01/17/21 and prescribed Valtrex for 7 days. Painful in afternoons. Pt also prescribed Gabapentin-not taking but prn.  Relates a lot of throbbing burning pain and discomfort.  Review of Systems     Objective:   Physical Exam Lungs are clear respiratory rate normal heart regular has extensive rash of shingles it is in the healing process  Mild sinus congestion drainage noted patient relates sinus pressure     Assessment & Plan:  Shingles Healing process Gabapentin recommended twice daily take this late afternoon and evening Give [place] feedback in the next few weeks if this is not helping No further need for Valtrex Call us if any ongoing troubles or problems More than likely has some sinus symptoms related to sinusitis antibiotic prescribed

## 2021-02-01 ENCOUNTER — Other Ambulatory Visit: Payer: Self-pay | Admitting: Family Medicine

## 2021-02-01 ENCOUNTER — Other Ambulatory Visit (HOSPITAL_COMMUNITY): Payer: Self-pay

## 2021-02-01 ENCOUNTER — Other Ambulatory Visit: Payer: Self-pay

## 2021-02-01 ENCOUNTER — Other Ambulatory Visit (HOSPITAL_COMMUNITY)
Admission: RE | Admit: 2021-02-01 | Discharge: 2021-02-01 | Disposition: A | Discharge status: Home / Self Care | Payer: 59 | Source: Ambulatory Visit | Attending: Family Medicine | Admitting: Family Medicine

## 2021-02-01 DIAGNOSIS — R609 Edema, unspecified: Secondary | ICD-10-CM

## 2021-02-01 DIAGNOSIS — Z79899 Other long term (current) drug therapy: Secondary | ICD-10-CM | POA: Diagnosis not present

## 2021-02-01 LAB — BASIC METABOLIC PANEL
Anion gap: 4 — ABNORMAL LOW (ref 5–15)
BUN: 13 mg/dL (ref 6–20)
CO2: 27 mmol/L (ref 22–32)
Calcium: 8.6 mg/dL — ABNORMAL LOW (ref 8.9–10.3)
Chloride: 105 mmol/L (ref 98–111)
Creatinine, Ser: 0.91 mg/dL (ref 0.61–1.24)
GFR, Estimated: >60 mL/min (ref >60)
Glucose, Bld: 109 mg/dL — ABNORMAL HIGH (ref 70–99)
Potassium: 4.7 mmol/L (ref 3.5–5.1)
Sodium: 136 mmol/L (ref 135–145)

## 2021-02-01 LAB — BRAIN NATRIURETIC PEPTIDE: B Natriuretic Peptide: 90 pg/mL (ref 0.0–100.0)

## 2021-02-01 MED ORDER — FUROSEMIDE 20 MG PO TABS
ORAL_TABLET | ORAL | 0 refills | Status: DC
  Filled 2021-02-01: qty 30, 30d supply, fill #0

## 2021-03-03 ENCOUNTER — Ambulatory Visit: Payer: 59

## 2021-03-03 ENCOUNTER — Ambulatory Visit (INDEPENDENT_AMBULATORY_CARE_PROVIDER_SITE_OTHER): Payer: 59 | Admitting: Orthopedic Surgery

## 2021-03-03 ENCOUNTER — Other Ambulatory Visit: Payer: Self-pay | Admitting: Orthopedic Surgery

## 2021-03-03 ENCOUNTER — Other Ambulatory Visit: Payer: Self-pay

## 2021-03-03 ENCOUNTER — Encounter: Payer: Self-pay | Admitting: Orthopedic Surgery

## 2021-03-03 VITALS — BP 171/100 | HR 84 | Ht 72.0 in | Wt 227.0 lb

## 2021-03-03 DIAGNOSIS — M25521 Pain in right elbow: Secondary | ICD-10-CM | POA: Diagnosis not present

## 2021-03-03 NOTE — Progress Notes (Signed)
New Patient Visit  Assessment: [name] [name] is a 60 y.o. male with the following: 1. Pain in right elbow; swelling with restricted ROM, possible occult fracture  Plan: Review radiographs with the patient.  He does not have an obvious fracture.  He does not have point tenderness on physical exam.  He does have some swelling around the elbow, with slightly restricted flexion and extension.  Continue with tylenol, ibuprofen and ice.  ROM as tolerated.  Instructed him to continue to work on ROM and avoid heavily lifting if his elbow is still hurting.  If issues persist, he should contact the clinic.  Otherwise, follow up as needed.    Follow-up: Return if symptoms worsen or fail to improve.  Subjective:  Chief Complaint  Patient presents with   Elbow Pain    Pt fell on 02/26/21 and started having right elbow pain 2 days later.     History of Present Illness: [name] [name] is a 60 y.o. RHD male who presents for evaluation of right elbow pain.   He states he fell a few days ago and landed on an outstretched arm.  In the subsequent days he noted stiffness and some pain.  He states last night that he was unable to reach his hand to his mouth.  He has been icing his elbow, and taking tylenol and ibuprofen.  His elbow feels stiff and swollen.     Review of Systems: No fevers or chills No numbness or tingling No chest pain No shortness of breath No bowel or bladder dysfunction No GI distress No headaches   Medical History:  Past Medical History:  Diagnosis Date   Acute systolic congestive heart failure (HCC)    Atrial fibrillation with rapid ventricular response ([place]) 12/13/2015   "had a heart monitor and can't find a source for the a. fib"   Cardiomyopathy ([place]) 12/13/2015   Cardiomyopathy, ischemic 12/14/2015   a. EF 20-25% in 12/2015 b. 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   LIMA to LAD, SVG to OM, SVG to PDA, EVH via right thigh with clipping of LA appendage   Tobacco abuse     Past Surgical History:  Procedure Laterality Date   CARDIAC CATHETERIZATION N/A 12/15/2015   Procedure: Right/Left Heart Cath and Coronary Angiography;  Surgeon: Wellington Hampshire, MD;  Location: [facility];  Service: Cardiovascular;  Laterality: N/A;   CLIPPING OF ATRIAL APPENDAGE Left 12/17/2015   Procedure: CLIPPING OF ATRIAL APPENDAGE;  Surgeon: [name], MD;  Location: [facility];  Service: Open Heart Surgery;  Laterality: Left;   COLONOSCOPY WITH PROPOFOL N/A 06/21/2016   Procedure: COLONOSCOPY WITH PROPOFOL;  Surgeon: Manus Gunning, MD;  Location: [facility];  Service: Gastroenterology;  Laterality: N/A;   CORONARY ARTERY BYPASS GRAFT N/A 12/17/2015   Procedure: CORONARY ARTERY BYPASS GRAFTING (CABG) x three , using left internal mammary artery and right leg greater saphenous vein;  Surgeon: [name], MD;  Location: [facility];  Service: Open Heart Surgery;  Laterality: N/A;   ENDARTERECTOMY Left 02/09/2016   Procedure: LEFT  CAROTID ENDARTERECTOMY;  Surgeon: Serafina Mitchell, MD;  Location: [facility];  Service: Vascular;  Laterality: Left;   KNEE ARTHROSCOPY Right 1991   PATCH ANGIOPLASTY Left 02/09/2016   Procedure: PATCH ANGIOPLASTY USING Rueben Bash BIOLOGIC PATCH;  Surgeon: Serafina Mitchell, MD;  Location: [facility] OR;  Service: Vascular;  Laterality: Left;    Family History  Problem Relation Age of Onset   Heart attack Paternal Grandfather    Heart failure Father    Atrial fibrillation Father    Social History   Tobacco Use   Smoking status: Former    Packs/day: 1.00     Years: 35.00    Pack years: 35.00    Types: Cigarettes    Quit date: 12/13/2015    Years since quitting: 5.2   Smokeless tobacco: Former    Quit date: 12/13/2015  Substance Use Topics   Alcohol use: Yes    Alcohol/week: 0.0 standard drinks    Comment: rare   Drug use: No    No Known Allergies  No outpatient medications have been marked as taking for the 03/03/21 encounter (Office Visit) with Mordecai Rasmussen, MD.    Objective: BP (!) 171/100   Pulse 84   Ht 6' (1.829 m)   Wt 227 lb (103 kg)   BMI 30.79 kg/m   Physical Exam:  General: Alert and oriented. and No acute distress. Gait: Normal gait.  Right elbow with mild swelling.  No tenderness over the olecranon process, olecranon fossa or the radial head.  Full pronation and supination.  Active ROM from 25-120.  He is lacking some extension.  Sensation intact to his right hand.  2+ radial pulse.    IMAGING: I personally ordered and reviewed the following images  XR of the right elbow without acute injury.  No obvious fracture to the radial head, or the olecranon.  Mild degenerative changes, with small osteophytes.   Impression: normal right elbow XR   New Medications:  No orders of the defined types were placed in this encounter.     Mordecai Rasmussen, MD  03/03/2021 8:38 AM

## 2021-03-08 ENCOUNTER — Observation Stay (HOSPITAL_COMMUNITY)
Admission: EM | Admit: 2021-03-08 | Discharge: 2021-03-09 | Disposition: A | Discharge status: Home / Self Care | Payer: 59 | Source: Home / Self Care | Attending: Student | Admitting: Student

## 2021-03-08 ENCOUNTER — Emergency Department (HOSPITAL_COMMUNITY): Payer: 59

## 2021-03-08 ENCOUNTER — Encounter (HOSPITAL_COMMUNITY): Payer: Self-pay | Admitting: Emergency Medicine

## 2021-03-08 DIAGNOSIS — I5020 Unspecified systolic (congestive) heart failure: Secondary | ICD-10-CM | POA: Diagnosis not present

## 2021-03-08 DIAGNOSIS — I63531 Cerebral infarction due to unspecified occlusion or stenosis of right posterior cerebral artery: Secondary | ICD-10-CM | POA: Diagnosis not present

## 2021-03-08 DIAGNOSIS — I6501 Occlusion and stenosis of right vertebral artery: Secondary | ICD-10-CM | POA: Diagnosis not present

## 2021-03-08 DIAGNOSIS — Z79899 Other long term (current) drug therapy: Secondary | ICD-10-CM | POA: Insufficient documentation

## 2021-03-08 DIAGNOSIS — Y900 Blood alcohol level of less than 20 mg/100 ml: Secondary | ICD-10-CM | POA: Insufficient documentation

## 2021-03-08 DIAGNOSIS — I4891 Unspecified atrial fibrillation: Secondary | ICD-10-CM | POA: Diagnosis not present

## 2021-03-08 DIAGNOSIS — I6621 Occlusion and stenosis of right posterior cerebral artery: Secondary | ICD-10-CM | POA: Diagnosis not present

## 2021-03-08 DIAGNOSIS — Z87891 Personal history of nicotine dependence: Secondary | ICD-10-CM | POA: Insufficient documentation

## 2021-03-08 DIAGNOSIS — I5043 Acute on chronic combined systolic (congestive) and diastolic (congestive) heart failure: Secondary | ICD-10-CM | POA: Insufficient documentation

## 2021-03-08 DIAGNOSIS — I6523 Occlusion and stenosis of bilateral carotid arteries: Secondary | ICD-10-CM | POA: Diagnosis not present

## 2021-03-08 DIAGNOSIS — Z7982 Long term (current) use of aspirin: Secondary | ICD-10-CM | POA: Insufficient documentation

## 2021-03-08 DIAGNOSIS — Z20822 Contact with and (suspected) exposure to covid-19: Secondary | ICD-10-CM | POA: Insufficient documentation

## 2021-03-08 DIAGNOSIS — I2510 Atherosclerotic heart disease of native coronary artery without angina pectoris: Secondary | ICD-10-CM | POA: Insufficient documentation

## 2021-03-08 DIAGNOSIS — I110 Hypertensive heart disease with heart failure: Secondary | ICD-10-CM | POA: Insufficient documentation

## 2021-03-08 DIAGNOSIS — I63231 Cerebral infarction due to unspecified occlusion or stenosis of right carotid arteries: Secondary | ICD-10-CM

## 2021-03-08 DIAGNOSIS — I639 Cerebral infarction, unspecified: Secondary | ICD-10-CM | POA: Diagnosis present

## 2021-03-08 DIAGNOSIS — Z951 Presence of aortocoronary bypass graft: Secondary | ICD-10-CM | POA: Insufficient documentation

## 2021-03-08 LAB — CBC
HCT: 44.9 % (ref 39.0–52.0)
Hemoglobin: 15.4 g/dL (ref 13.0–17.0)
MCH: 31.8 pg (ref 26.0–34.0)
MCHC: 34.3 g/dL (ref 30.0–36.0)
MCV: 92.6 fL (ref 80.0–100.0)
Platelets: 243 10*3/uL (ref 150–400)
RBC: 4.85 MIL/uL (ref 4.22–5.81)
RDW: 13.4 % (ref 11.5–15.5)
WBC: 7.4 10*3/uL (ref 4.0–10.5)
nRBC: 0 % (ref 0.0–0.2)

## 2021-03-08 LAB — RAPID URINE DRUG SCREEN, HOSP PERFORMED
Amphetamines: NOT DETECTED
Barbiturates: NOT DETECTED
Benzodiazepines: NOT DETECTED
Cocaine: NOT DETECTED
Opiates: NOT DETECTED
Tetrahydrocannabinol: NOT DETECTED

## 2021-03-08 LAB — DIFFERENTIAL
Abs Immature Granulocytes: 0.05 10*3/uL (ref 0.00–0.07)
Basophils Absolute: 0.1 10*3/uL (ref 0.0–0.1)
Basophils Relative: 1 %
Eosinophils Absolute: 0.1 10*3/uL (ref 0.0–0.5)
Eosinophils Relative: 2 %
Immature Granulocytes: 1 %
Lymphocytes Relative: 51 %
Lymphs Abs: 3.8 10*3/uL (ref 0.7–4.0)
Monocytes Absolute: 0.6 10*3/uL (ref 0.1–1.0)
Monocytes Relative: 8 %
Neutro Abs: 2.8 10*3/uL (ref 1.7–7.7)
Neutrophils Relative %: 37 %

## 2021-03-08 LAB — I-STAT CHEM 8, ED
BUN: 11 mg/dL (ref 6–20)
Calcium, Ion: 1.2 mmol/L (ref 1.15–1.40)
Chloride: 103 mmol/L (ref 98–111)
Creatinine, Ser: 0.9 mg/dL (ref 0.61–1.24)
Glucose, Bld: 100 mg/dL — ABNORMAL HIGH (ref 70–99)
HCT: 45 % (ref 39.0–52.0)
Hemoglobin: 15.3 g/dL (ref 13.0–17.0)
Potassium: 4.5 mmol/L (ref 3.5–5.1)
Sodium: 140 mmol/L (ref 135–145)
TCO2: 29 mmol/L (ref 22–32)

## 2021-03-08 LAB — COMPREHENSIVE METABOLIC PANEL
ALT: 20 U/L (ref 0–44)
AST: 21 U/L (ref 15–41)
Albumin: 3.7 g/dL (ref 3.5–5.0)
Alkaline Phosphatase: 120 U/L (ref 38–126)
Anion gap: 5 (ref 5–15)
BUN: 11 mg/dL (ref 6–20)
CO2: 26 mmol/L (ref 22–32)
Calcium: 9 mg/dL (ref 8.9–10.3)
Chloride: 105 mmol/L (ref 98–111)
Creatinine, Ser: 0.98 mg/dL (ref 0.61–1.24)
GFR, Estimated: >60 mL/min (ref >60)
Glucose, Bld: 104 mg/dL — ABNORMAL HIGH (ref 70–99)
Potassium: 4.6 mmol/L (ref 3.5–5.1)
Sodium: 136 mmol/L (ref 135–145)
Total Bilirubin: 0.5 mg/dL (ref 0.3–1.2)
Total Protein: 7 g/dL (ref 6.5–8.1)

## 2021-03-08 LAB — CBG MONITORING, ED: Glucose-Capillary: 102 mg/dL — ABNORMAL HIGH (ref 70–99)

## 2021-03-08 LAB — PROTIME-INR
INR: 1 (ref 0.8–1.2)
Prothrombin Time: 13.1 seconds (ref 11.4–15.2)

## 2021-03-08 LAB — RESP PANEL BY RT-PCR (FLU A&B, COVID) ARPGX2
Influenza A by PCR: NEGATIVE
Influenza B by PCR: NEGATIVE
SARS Coronavirus 2 by RT PCR: NEGATIVE

## 2021-03-08 LAB — APTT: aPTT: 35 seconds (ref 24–36)

## 2021-03-08 LAB — ETHANOL: Alcohol, Ethyl (B): <10 mg/dL (ref <10)

## 2021-03-08 MED ORDER — ATORVASTATIN CALCIUM 80 MG PO TABS
80.0000 mg | ORAL_TABLET | Freq: Every day | ORAL | Status: DC
  Administered 2021-03-08 – 2021-03-09 (×2): 80 mg via ORAL
  Filled 2021-03-08 (×2): qty 1

## 2021-03-08 MED ORDER — ACETAMINOPHEN 650 MG RE SUPP: 650.0000 mg | RECTAL | Status: DC | PRN

## 2021-03-08 MED ORDER — STROKE: EARLY STAGES OF RECOVERY BOOK
Freq: Once | Status: DC
  Filled 2021-03-08: qty 1

## 2021-03-08 MED ORDER — IOHEXOL 350 MG/ML SOLN
75.0000 mL | Freq: Once | INTRAVENOUS | Status: AC | PRN
  Administered 2021-03-08: 75 mL via INTRAVENOUS

## 2021-03-08 MED ORDER — ENOXAPARIN SODIUM 40 MG/0.4ML IJ SOSY
40.0000 mg | PREFILLED_SYRINGE | INTRAMUSCULAR | Status: DC
  Administered 2021-03-08: 40 mg via SUBCUTANEOUS
  Filled 2021-03-08: qty 0.4

## 2021-03-08 MED ORDER — ENOXAPARIN SODIUM 40 MG/0.4ML IJ SOSY: 40.0000 mg | PREFILLED_SYRINGE | INTRAMUSCULAR | Status: DC

## 2021-03-08 MED ORDER — SODIUM CHLORIDE 0.9% FLUSH: 3.0000 mL | Freq: Once | INTRAVENOUS | Status: DC

## 2021-03-08 MED ORDER — CARVEDILOL 6.25 MG PO TABS
6.2500 mg | ORAL_TABLET | Freq: Two times a day (BID) | ORAL | Status: DC
  Administered 2021-03-08 – 2021-03-09 (×2): 6.25 mg via ORAL
  Filled 2021-03-08 (×2): qty 1

## 2021-03-08 MED ORDER — ACETAMINOPHEN 325 MG PO TABS: 650.0000 mg | ORAL_TABLET | ORAL | Status: DC | PRN

## 2021-03-08 MED ORDER — ACETAMINOPHEN 160 MG/5ML PO SOLN: 650.0000 mg | ORAL | Status: DC | PRN

## 2021-03-08 MED ORDER — GABAPENTIN 300 MG PO CAPS: 300.0000 mg | ORAL_CAPSULE | Freq: Every day | ORAL | Status: DC

## 2021-03-08 MED ORDER — GABAPENTIN 300 MG PO CAPS: 300.0000 mg | ORAL_CAPSULE | Freq: Every evening | ORAL | Status: DC | PRN

## 2021-03-08 MED ORDER — ASPIRIN EC 81 MG PO TBEC
81.0000 mg | DELAYED_RELEASE_TABLET | Freq: Every day | ORAL | Status: DC
  Administered 2021-03-09: 81 mg via ORAL
  Filled 2021-03-08: qty 1

## 2021-03-08 NOTE — ED Notes (Signed)
Patient transported to CT 

## 2021-03-08 NOTE — Consult Note (Signed)
Referring Physician: Dr. Matilde Sprang    Chief Complaint: Trouble writing, left arm tingling, left face numbness  HPI: [name] [name] is an 60 y.o. male with a PMHx of CAD s/p CABG with clipping of LA appendage, ischemic cardiomyopathy with LVEF of 35-40%, CHF, one prior isolated episode of atrial fibrillation, hypercholesterolemia, carotid stenosis s/p left CEA in 2017 and tobacco abuse who presents to the ED 9/6 for evaluation of left arm tingling, left face numbness, and trouble writing. He first noticed left arm tingling and left face numbness on Saturday, September 3 with associated headache. While at work today, he noticed that he was having difficulty writing and decided to come into the ED for evaluation. A CT head was obtained revealing a right occipital lobe acute to subacute infarct and neurology was consulted for further evaluation and management.   LSN: 03/05/2021 TNK Given: No: Patient well outside of the time window for thrombolytic therapy  Past Medical History:  Diagnosis Date   Acute systolic congestive heart failure ([place])    Atrial fibrillation with rapid ventricular response ([place]) 12/13/2015   "had a heart monitor and can't find a source for the a. fib"   Cardiomyopathy ([place]) 12/13/2015   Cardiomyopathy, ischemic 12/14/2015   a. EF 20-25% in 12/2015 b. EF at 35-40% by repeat echo in 10/2016   Coronary artery disease involving native coronary artery with unstable angina pectoris ([place]) 12/15/2015   Hypercholesterolemia 12/15/2015   Mitral regurgitation 12/14/2015   moderate   NSTEMI (non-ST elevated myocardial infarction) (HCC)    PONV (postoperative nausea and vomiting)    "sick with very first surgery but nothing since" in 1990s   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 12/17/2015   LIMA to LAD, SVG to OM, SVG to PDA, EVH via right thigh with clipping of LA appendage   Tobacco abuse    Past Surgical History:  Procedure Laterality Date   CARDIAC CATHETERIZATION N/A 12/15/2015    Procedure: Right/Left Heart Cath and Coronary Angiography;  Surgeon: Wellington Hampshire, MD;  Location: [facility];  Service: Cardiovascular;  Laterality: N/A;   CLIPPING OF ATRIAL APPENDAGE Left 12/17/2015   Procedure: CLIPPING OF ATRIAL APPENDAGE;  Surgeon: [name], MD;  Location: [facility];  Service: Open Heart Surgery;  Laterality: Left;   COLONOSCOPY WITH PROPOFOL N/A 06/21/2016   Procedure: COLONOSCOPY WITH PROPOFOL;  Surgeon: Manus Gunning, MD;  Location: [facility];  Service: Gastroenterology;  Laterality: N/A;   CORONARY ARTERY BYPASS GRAFT N/A 12/17/2015   Procedure: CORONARY ARTERY BYPASS GRAFTING (CABG) x three , using left internal mammary artery and right leg greater saphenous vein;  Surgeon: [name], MD;  Location: [place];  Service: Open Heart Surgery;  Laterality: N/A;   ENDARTERECTOMY Left 02/09/2016   Procedure: LEFT CAROTID ENDARTERECTOMY;  Surgeon: Serafina Mitchell, MD;  Location: [facility];  Service: Vascular;  Laterality: Left;   KNEE ARTHROSCOPY Right 1991   PATCH ANGIOPLASTY Left 02/09/2016   Procedure: PATCH ANGIOPLASTY USING Rueben Bash BIOLOGIC PATCH;  Surgeon: Serafina Mitchell, MD;  Location: [facility] OR;  Service: Vascular;  Laterality: Left;   Family History  Problem Relation Age of Onset   Heart attack Paternal Grandfather    Heart failure Father    Atrial fibrillation Father    Social History:  reports that he quit smoking about 5 years ago. His smoking use included cigarettes. He has a 35.00 pack-year smoking history. He quit smokeless tobacco use about 5 years  ago. He reports current alcohol use. He reports that he does not use drugs.  Allergies: No Known Allergies  Medications: I have reviewed the patient's current medications. No current facility-administered medications on file prior to encounter.   Current Outpatient Medications on File Prior to Encounter  Medication Sig Dispense Refill   aspirin 81 MG EC tablet Take 1 tablet (81 mg total) by  mouth daily. (Patient taking differently: Take 81 mg by mouth daily as needed (blood thinner).) 30 tablet    atorvastatin (LIPITOR) 80 MG tablet TAKE 1 TABLET BY MOUTH DAILY. 90 tablet 3   carvedilol (COREG) 6.25 MG tablet TAKE 1 TABLET BY MOUTH 2 TIMES DAILY WITH A MEAL. 180 tablet 3   furosemide (LASIX) 20 MG tablet Take 1 tablet by mouth every morning for the next 3 days then 1 every morning as needed edema (Patient taking differently: Take 20 mg by mouth daily as needed for fluid or edema.) 30 tablet 0   gabapentin (NEURONTIN) 300 MG capsule One 2 times a day late afternoon and evening (Patient taking differently: Take 300 mg by mouth 2 (two) times daily as needed (nerve pain).) 60 capsule 3   lisinopril (ZESTRIL) 10 MG tablet TAKE 1 TABLET BY MOUTH AT BEDTIME. 90 tablet 3   spironolactone (ALDACTONE) 25 MG tablet TAKE 1/2 TABLET BY MOUTH DAILY (Patient taking differently: Take 25 mg by mouth every other day.) 45 tablet 3   amoxicillin-clavulanate (AUGMENTIN) 875-125 MG tablet Take one tablet po BID for 7 days (Patient not taking: No sig reported) 14 tablet 0     Scheduled:   stroke: mapping our early stages of recovery book   Does not apply Once   [START ON 03/09/2021] aspirin EC  81 mg Oral Daily   atorvastatin  80 mg Oral Daily   carvedilol  6.25 mg Oral BID WC   enoxaparin (LOVENOX) injection  40 mg Subcutaneous Q24H   ROS: He denies facial droop, dysphasia, confusion, headache, limb weakness, limb numbness, trouble walking, N/V, CP, SOB or abdominal symptoms. Other ROS as per HPI.    Physical Examination: Blood pressure (!) 173/96, pulse 74, temperature 98 F (36.7 C), temperature source Oral, resp. rate 18, SpO2 100 %.  HEENT: [place]/AT Lungs: Respirations unlabored Ext: No edema  Neurologic Examination: Mental Status: Alert, oriented, thought content appropriate.  Speech fluent without evidence of aphasia.  Able to follow all commands without difficulty. Cranial Nerves: II:   Homonymous left inferior quadrantanopsia. PERRL.  III,IV, VI: No ptosis. EOMI. No nystagmus.  V,VII: Smile symmetric, facial temp sensation equal bilaterally VIII: hearing intact to voice IX,X: Palate rises symmetrically XI: Symmetric shoulder shrug XII: Midline tongue extension  Motor: Right : Upper extremity   5/5    Left:     Upper extremity   5/5  Lower extremity   5/5     Lower extremity   5/5 Normal tone throughout; no atrophy noted Sensory: Temp and light touch intact throughout, bilaterally Deep Tendon Reflexes:  2+ bilateral biceps, brachioradialis and patellae Cerebellar: No ataxia with FNF bilaterally  Gait: Deferred   Results for orders placed or performed during the hospital encounter of 03/08/21 (from the past 48 hour(s))  CBG monitoring, ED     Status: Abnormal   Collection Time: 03/08/21  1:23 PM  Result Value Ref Range   Glucose-Capillary 102 (H) 70 - 99 mg/dL    Comment: Glucose reference range applies only to samples taken after fasting for at least 8 hours.  CBC     Status: None   Collection Time: 03/08/21  1:26 PM  Result Value Ref Range   WBC 7.4 4.0 - 10.5 K/uL   RBC 4.85 4.22 - 5.81 MIL/uL   Hemoglobin 15.4 13.0 - 17.0 g/dL   HCT 44.9 39.0 - 52.0 %   MCV 92.6 80.0 - 100.0 fL   MCH 31.8 26.0 - 34.0 pg   MCHC 34.3 30.0 - 36.0 g/dL   RDW 13.4 11.5 - 15.5 %   Platelets 243 150 - 400 K/uL   nRBC 0.0 0.0 - 0.2 %    Comment: Performed at [facility], [place] [address] [place], [place] [address]  Differential     Status: None   Collection Time: 03/08/21  1:26 PM  Result Value Ref Range   Neutrophils Relative % 37 %   Neutro Abs 2.8 1.7 - 7.7 K/uL   Lymphocytes Relative 51 %   Lymphs Abs 3.8 0.7 - 4.0 K/uL   Monocytes Relative 8 %   Monocytes Absolute 0.6 0.1 - 1.0 K/uL   Eosinophils Relative 2 %   Eosinophils Absolute 0.1 0.0 - 0.5 K/uL   Basophils Relative 1 %   Basophils Absolute 0.1 0.0 - 0.1 K/uL   Immature Granulocytes 1 %   Abs Immature  Granulocytes 0.05 0.00 - 0.07 K/uL    Comment: Performed at [facility] [address] [place], [place] [address]  Comprehensive metabolic panel     Status: Abnormal   Collection Time: 03/08/21  1:26 PM  Result Value Ref Range   Sodium 136 135 - 145 mmol/L   Potassium 4.6 3.5 - 5.1 mmol/L   Chloride 105 98 - 111 mmol/L   CO2 26 22 - 32 mmol/L   Glucose, Bld 104 (H) 70 - 99 mg/dL    Comment: Glucose reference range applies only to samples taken after fasting for at least 8 hours.   BUN 11 6 - 20 mg/dL   Creatinine, Ser 0.98 0.61 - 1.24 mg/dL   Calcium 9.0 8.9 - 10.3 mg/dL   Total Protein 7.0 6.5 - 8.1 g/dL   Albumin 3.7 3.5 - 5.0 g/dL   AST 21 15 - 41 U/L   ALT 20 0 - 44 U/L   Alkaline Phosphatase 120 38 - 126 U/L   Total Bilirubin 0.5 0.3 - 1.2 mg/dL   GFR, Estimated >60 >60 mL/min    Comment: (NOTE) Calculated using the CKD-EPI Creatinine Equation (2021)    Anion gap 5 5 - 15    Comment: Performed at [facility] [address] [place], [place] [address]  Ethanol     Status: None   Collection Time: 03/08/21  1:26 PM  Result Value Ref Range   Alcohol, Ethyl (B) <10 <10 mg/dL    Comment: (NOTE) Lowest detectable limit for serum alcohol is 10 mg/dL.  For medical purposes only. Performed at [facility], [place] [address], [place],  [address]   I-stat chem 8, ED     Status: Abnormal   Collection Time: 03/08/21  1:40 PM  Result Value Ref Range   Sodium 140 135 - 145 mmol/L   Potassium 4.5 3.5 - 5.1 mmol/L   Chloride 103 98 - 111 mmol/L   BUN 11 6 - 20 mg/dL   Creatinine, Ser 0.90 0.61 - 1.24 mg/dL   Glucose, Bld 100 (H) 70 - 99 mg/dL    Comment: Glucose reference range applies only to  samples taken after fasting for at least 8 hours.   Calcium, Ion 1.20 1.15 - 1.40 mmol/L   TCO2 29 22 - 32 mmol/L   Hemoglobin 15.3 13.0 - 17.0 g/dL   HCT 45.0 39.0 - 52.0 %   CT Angio Head W or Wo Contrast  Result Date: 03/08/2021 CLINICAL DATA:  Neuro  deficit, stroke suspected EXAM: CT ANGIOGRAPHY HEAD AND NECK TECHNIQUE: Multidetector CT imaging of the head and neck was performed using the standard protocol during bolus administration of intravenous contrast. Multiplanar CT image reconstructions and MIPs were obtained to evaluate the vascular anatomy. Carotid stenosis measurements (when applicable) are obtained utilizing NASCET criteria, using the distal internal carotid diameter as the denominator. CONTRAST:  67m OMNIPAQUE IOHEXOL 350 MG/ML SOLN COMPARISON:  Same-day noncontrast CT head FINDINGS: CTA NECK FINDINGS Aortic arch: There is mild calcified atherosclerotic plaque of the aortic arch. The branching pattern is normal. Postsurgical changes reflecting CABG are noted. Right carotid system: There is soft and calcified atherosclerotic plaque of the proximal right internal carotid artery resulting in proximally 70-80% stenosis. A hypodense lesion extends superiorly from the level of the stenosis likely reflecting mural adherent thrombus (7-216, 8-113). The distal internal carotid artery is patent. The external carotid artery is patent. There is no dissection or aneurysm. Left carotid system: There is predominantly soft atherosclerotic plaque of the proximal left internal carotid artery resulting in up to 20-30% stenosis. There is no dissection or aneurysm. Vertebral arteries: There is mild calcified atherosclerotic plaque at the origin of the right vertebral artery. The vertebral arteries are otherwise patent bilaterally. There is no hemodynamically significant stenosis or occlusion. There is no dissection or aneurysm. Skeleton: There is degenerative change of the cervical spine most advanced at C5-C6 and C6-C7. There is no acute osseous abnormality or aggressive osseous lesion. Other neck: A right palatine tonsillith is noted. Upper chest: There is scarring in the lung apices. Review of the MIP images confirms the above findings CTA HEAD FINDINGS Anterior  circulation: There is calcification of the bilateral cavernous ICAs resulting in up to mild stenosis on the left. There is no high-grade stenosis or occlusion. The bilateral MCAs and ACAs are patent. There is no aneurysm. Posterior circulation: The V4 segments of the vertebral arteries are patent with minimal calcified atherosclerotic plaque on the right. The basilar artery is patent. There is focal severe stenosis of the right PCA at the P1/P2 junction (7-100) with diminutive enhancement throughout the remainder of the right PCA. The left PCA is patent. Venous sinuses: As permitted by contrast timing, patent. Anatomic variants: None. Review of the MIP images confirms the above findings IMPRESSION: 1. Focal severe stenosis at the right P1/P2 junction with diminutive enhancement of the right PCA distal to this point. 2. Atherosclerotic plaque at the proximal right internal carotid artery resulting in 70-80% stenosis. A hypodense lesion extends superiorly from the level of the stenosis likely reflecting mural adherent thrombus. 3. Approximally 20-30% stenosis of the proximal left internal carotid artery. Electronically Signed   By: PValetta MoleM.D.   On: 03/08/2021 14:30   CT HEAD WO CONTRAST  Result Date: 03/08/2021 CLINICAL DATA:  Blurry vision. EXAM: CT HEAD WITHOUT CONTRAST TECHNIQUE: Contiguous axial images were obtained from the base of the skull through the vertex without intravenous contrast. COMPARISON:  None. FINDINGS: Brain: Right occipital lobe hypodensities with loss of the normal gray-white matter differentiation (series 4, image 14; series 6, image 63). No evidence of hemorrhage, hydrocephalus, extra-axial collection or mass  lesion/mass effect. Scattered periventricular and subcortical white matter hypodensities are nonspecific, but favored to reflect chronic microvascular ischemic changes. Vascular: Atherosclerotic vascular calcification of the carotid siphons. No hyperdense vessel. Skull: Normal.  Negative for fracture or focal lesion. Sinuses/Orbits: No acute finding. Other: None. IMPRESSION: 1. Right occipital lobe acute to subacute infarct. Electronically Signed   By: Titus Dubin M.D.   On: 03/08/2021 12:11   CT Angio Neck W and/or Wo Contrast  Result Date: 03/08/2021 CLINICAL DATA:  Neuro deficit, stroke suspected EXAM: CT ANGIOGRAPHY HEAD AND NECK TECHNIQUE: Multidetector CT imaging of the head and neck was performed using the standard protocol during bolus administration of intravenous contrast. Multiplanar CT image reconstructions and MIPs were obtained to evaluate the vascular anatomy. Carotid stenosis measurements (when applicable) are obtained utilizing NASCET criteria, using the distal internal carotid diameter as the denominator. CONTRAST:  90m OMNIPAQUE IOHEXOL 350 MG/ML SOLN COMPARISON:  Same-day noncontrast CT head FINDINGS: CTA NECK FINDINGS Aortic arch: There is mild calcified atherosclerotic plaque of the aortic arch. The branching pattern is normal. Postsurgical changes reflecting CABG are noted. Right carotid system: There is soft and calcified atherosclerotic plaque of the proximal right internal carotid artery resulting in proximally 70-80% stenosis. A hypodense lesion extends superiorly from the level of the stenosis likely reflecting mural adherent thrombus (7-216, 8-113). The distal internal carotid artery is patent. The external carotid artery is patent. There is no dissection or aneurysm. Left carotid system: There is predominantly soft atherosclerotic plaque of the proximal left internal carotid artery resulting in up to 20-30% stenosis. There is no dissection or aneurysm. Vertebral arteries: There is mild calcified atherosclerotic plaque at the origin of the right vertebral artery. The vertebral arteries are otherwise patent bilaterally. There is no hemodynamically significant stenosis or occlusion. There is no dissection or aneurysm. Skeleton: There is degenerative change  of the cervical spine most advanced at C5-C6 and C6-C7. There is no acute osseous abnormality or aggressive osseous lesion. Other neck: A right palatine tonsillith is noted. Upper chest: There is scarring in the lung apices. Review of the MIP images confirms the above findings CTA HEAD FINDINGS Anterior circulation: There is calcification of the bilateral cavernous ICAs resulting in up to mild stenosis on the left. There is no high-grade stenosis or occlusion. The bilateral MCAs and ACAs are patent. There is no aneurysm. Posterior circulation: The V4 segments of the vertebral arteries are patent with minimal calcified atherosclerotic plaque on the right. The basilar artery is patent. There is focal severe stenosis of the right PCA at the P1/P2 junction (7-100) with diminutive enhancement throughout the remainder of the right PCA. The left PCA is patent. Venous sinuses: As permitted by contrast timing, patent. Anatomic variants: None. Review of the MIP images confirms the above findings IMPRESSION: 1. Focal severe stenosis at the right P1/P2 junction with diminutive enhancement of the right PCA distal to this point. 2. Atherosclerotic plaque at the proximal right internal carotid artery resulting in 70-80% stenosis. A hypodense lesion extends superiorly from the level of the stenosis likely reflecting mural adherent thrombus. 3. Approximally 20-30% stenosis of the proximal left internal carotid artery. Electronically Signed   By: PValetta MoleM.D.   On: 03/08/2021 14:30    Assessment: 60y.o. male with PMHx of HFrEF, ischemic cardiomyopathy s/p CABG with clipping of LA appendage, atrial fibrillation, carotid stenosis s/p L CEA in 2017 and tobacco abuse who presented to the ED 03/08/2021 for evaluation of 3 days of left arm tingling,  left facial numbness, and difficulty writing noticed by patient at work today. CTH was obtained revealing an acute to subacute right occipital lobe infarction.  1. Exam reveals  homonymous left inferior quadrantanopsia. No other deficits appreciated.  2. CT head: Right occipital lobe cortically based hypodensity with loss of the normal gray-white matter differentiation appears most consistent with a subacute ischemic infarction. Also noted are scattered periventricular and subcortical white matter hypodensities, most likely reflecting chronic microvascular ischemic changes. 3. CTA of head and neck: Focal severe stenosis at the right P1/P2 junction with diminutive enhancement of the right PCA distal to this point. Atherosclerotic plaque at the proximal right internal carotid artery resulting in 70-80% stenosis. A hypodense lesion extends superiorly from the level of the stenosis likely reflecting mural adherent thrombus. Approximally 20-30% stenosis of the proximal left internal carotid artery.  4. Stroke Risk Factors - CAD, CHF, one prior isolated episode of atrial fibrillation, hypercholesterolemia, carotid stenosis and tobacco abuse  Recommendations: 1. HgbA1c, fasting lipid panel 2. MRI of the brain without contrast 3. PT consult, OT consult, Speech consult 4. Echocardiogram (last echo in 2018) 5. Carotid ultrasound to further assess the proximal ICA abnormalities seen on CTA neck. May be a candidate for elective carotid endarterectomy on the right.  6. Prophylactic therapy-Daily ASA 81 mg. He states that he has been taking ASA only about 2x per week at home, therefore not classifiable as having failed ASA monotherapy 7. Risk factor modification 8. Telemetry monitoring 9. Frequent neuro checks 10. BP management. Out of the permissive HTN time window.  11. Continue atorvastatin 80 mg po qd   '@Electronically'$  signed: Dr. Kerney Elbe  03/08/2021, 2:47 PM

## 2021-03-08 NOTE — ED Notes (Signed)
Attempted to call report

## 2021-03-08 NOTE — Progress Notes (Signed)
FPTS Brief Progress Note  S: Patient was sitting upright in bed.  He reports pain slightly irritated with a lack of progression with necessary labs and imaging.  He notes that he does not understand why still here.  Patient will not leave AMA at this time but is irritated with having to stay in the hospital when he can get the necessary imaging outpatient. Denies any residual neurological deficits, and he reports that he is back to normal.    O: BP (!) 153/89 (BP Location: Left Arm)   Pulse 76   Temp 97.7 F (36.5 C) (Oral)   Resp 18   SpO2 100%   General: NAD, sitting at side of bed, slightly irritable  Resp: Normal WOB Neuro: No evident neurological deficits  strength 5/5.   A/P:  Mr. [name] [name] is a 60 year old male who presented initially with a stroke.  We discussed with Mr. Doctor that the reason why we do not get his echo and risk stratification labs outpatient is because of his lack of outpatient follow-up and extended length of time outpatient work-up requires.  Talk to patient about risk of stroke being much higher in the next 3 weeks and requiring full work-up while in hospital.  Neurology is also following the patient.  Patient understands the need for required labs and imaging and denies wanting to leave AMA at this time.  We talked the patient about what to expect over the course of his stay.  At this point in time patient understands and is willing to continue with work-up.  Plan per day team  - Orders reviewed. Labs for AM ordered, which was adjusted as needed.   Erskine Emery, MD 03/08/2021, 9:37 PM PGY-1, [facility] Night Resident  Please page [phone] with questions.

## 2021-03-08 NOTE — H&P (Addendum)
[facility] Admission History and Physical Service Pager: [phone]  Patient name: [name] [name] Medical record number: [id number] Date of birth: April 27, 1961 Age: 60 y.o. Gender: male  Primary Care Provider: Kathyrn Drown, MD Consultants: Neuro Code Status: FULL Preferred Emergency Contact: Elissa Hefty [phone])  Chief Complaint: occipital stroke  Assessment and Plan: [name] [name] is a 60 y.o. male presenting with occipital stroke. PMH is significant for HFrEF (last EF 35-40%,  2018), ischemic cardiomyopathy s/p CABG with clipping of LA appendage, carotid stenosis s/p L CEA in 2017 and tobacco abuse.     Occipital stroke On 9/3 patient experienced right-sided headache, numbness of the left forearm and fingers.  Agraphia was first noticed on 9/6 when the patient went to work, prompting him to come to the emergency department. CT Head notable for R occipital lobe acute or subacute infarct. CTA head and neck notable for severe stenosis at P1-P2 and 80% occlusion of the R internal carotid. Patient with Hx of L carotid stenosis, corrected with L CEA in 2017. Etiology of stroke unclear at this time, patient was on atorvastatin 80 mg and ASA 81 mg daily, reports adherence. Notable physical exam findings include a grossly nonfocal neurological exam as detailed below. CBC and CMP unremarkable. He passed bedside swallow. -Admit to [place], attending Dr. Thompson Grayer -Neurology consulted, appreciate recommendations -MRI-brain -Carotid doppler -Cardiac monitoring -Echo -Lipid panel -A1C -Continue home ASA 81 mg daily -Continue home Lipitor 80 mg -Consider DAPT per Neuro recs -Consider R CEA per vascular recs -PT/OT consult -Regular diet -DVT ppx with lovenox  HFrEF  s/p CABGx3  Patient has a history of ischemic cardiomyopathy status post CABG.  Patient currently asymptomatic with respect to HF (denies CP, SoB, dyspnea on exertion, orthopnea, pedal edema). Patient  reports having a Holter monitor screening for afib. He reports this was negative and he was never actually diagnosed with A Fib. On physical exam and EKG, patient has a regular rate and rhythm.  -Continue Telemetry - Hold home medications of lasix, spironolactone, lisinopril for renal protection given imaging. -Daily weights -Strict I/O -Can dose lasix if patient develops peripheral edema or weight >3lbs from dry weight  HTN Patient reports compliance with home regimen of Coreg, lisinopril, and spironolactone. Currently hypertensive. -Continue home Coreg 6.25 mg BID -Hold home Lisinopril 10 mg -Hold home spironolactone 12.5 mg  HLD -Lipid panel -Continue home Lipitor 80 mg daily  Post-herpetic Neuralgia -Continue home gabapentin 300 mg PRN nightly  FEN/GI: regular diet, PO fluids Prophylaxis: Lovenox  Disposition: likely home in 2 days  History of Present Illness:  [name] [name] is a 60 y.o. male presenting with headache, ear numbness, agraphia. Patient was on vacation at the beach on Saturday when he had an intense headache. The headache persisted through the weekend and his wife noticed he was not perfectly staying in the lane while driving and had to do more correction than normal. Then when he went to work today, he noticed that he had trouble putting the pen to paper and making the pen work as he normally would. He is left hand dominant. He then noticed when he put his cell phone to his left ear that that ear felt numb and his left arm felt heavy. Last week, patient had an episode of shingles on his left back and has had postherpetic neuralgia and is using gabapentin prn for pain.  Review Of Systems: Per HPI with the following additions:   Review of Systems  Constitutional:  Negative for activity change, chills and fever.  HENT:  Negative for congestion.   Respiratory:  Negative for cough, chest tightness, shortness of breath and wheezing.   Cardiovascular:  Negative for chest  pain.  Gastrointestinal:  Negative for abdominal distention, abdominal pain, constipation, diarrhea, nausea and vomiting.  Musculoskeletal:  Negative for gait problem.  Neurological:  Positive for numbness and headaches. Negative for dizziness.  Psychiatric/Behavioral:  Negative for confusion and decreased concentration.     Patient Active Problem List   Diagnosis Date Noted   Colon cancer screening    Polyp of colon    Hypertensive emergency 04/02/2016   Acute on chronic combined systolic and diastolic CHF, NYHA class 4 ([place]) 04/02/2016   Acute kidney injury ([place]) 04/02/2016   Acute respiratory failure with hypoxia ([place]) 04/02/2016   Carotid stenosis, asymptomatic 02/09/2016   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 12/17/2015   Hypercholesterolemia 12/15/2015   Elevated troponin 12/15/2015   Coronary artery disease involving native coronary artery with unstable angina pectoris ([place]) 12/15/2015   Cardiomyopathy, ischemic 12/14/2015   Mitral regurgitation 12/14/2015   Atrial fibrillation with rapid ventricular response ([place]) 12/13/2015   Cardiomyopathy ([place]) 12/13/2015   Leukocytosis 12/13/2015   Tobacco abuse     Past Medical History: Past Medical History:  Diagnosis Date   Acute systolic congestive heart failure (HCC)    Atrial fibrillation with rapid ventricular response ([place]) 12/13/2015   "had a heart monitor and can't find a source for the a. fib"   Cardiomyopathy ([place]) 12/13/2015   Cardiomyopathy, ischemic 12/14/2015   a. EF 20-25% in 12/2015 b. EF at 35-40% by repeat echo in 10/2016   Coronary artery disease involving native coronary artery with unstable angina pectoris ([place]) 12/15/2015   Hypercholesterolemia 12/15/2015   Mitral regurgitation 12/14/2015   moderate   NSTEMI (non-ST elevated myocardial infarction) (HCC)    PONV (postoperative nausea and vomiting)    "sick with very first surgery but nothing since" in 1990s   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  12/17/2015   LIMA to LAD, SVG to OM, SVG to PDA, EVH via right thigh with clipping of LA appendage   Tobacco abuse     Past Surgical History: Past Surgical History:  Procedure Laterality Date   CARDIAC CATHETERIZATION N/A 12/15/2015   Procedure: Right/Left Heart Cath and Coronary Angiography;  Surgeon: Wellington Hampshire, MD;  Location: [facility];  Service: Cardiovascular;  Laterality: N/A;   CLIPPING OF ATRIAL APPENDAGE Left 12/17/2015   Procedure: CLIPPING OF ATRIAL APPENDAGE;  Surgeon: [name], MD;  Location: [place];  Service: Open Heart Surgery;  Laterality: Left;   COLONOSCOPY WITH PROPOFOL N/A 06/21/2016   Procedure: COLONOSCOPY WITH PROPOFOL;  Surgeon: Manus Gunning, MD;  [facility];  Service: Gastroenterology;  Laterality: N/A;   CORONARY ARTERY BYPASS GRAFT N/A 12/17/2015   Procedure: CORONARY ARTERY BYPASS GRAFTING (CABG) x three , using left internal mammary artery and right leg greater saphenous vein;  Surgeon: [name], MD;  Location: [place];  Service: Open Heart Surgery;  Laterality: N/A;   ENDARTERECTOMY Left 02/09/2016   Procedure: LEFT CAROTID ENDARTERECTOMY;  Surgeon: Serafina Mitchell, MD;  Location: [facility];  Service: Vascular;  Laterality: Left;   KNEE ARTHROSCOPY Right 1991   PATCH ANGIOPLASTY Left 02/09/2016   Procedure: PATCH ANGIOPLASTY USING Rueben Bash BIOLOGIC PATCH;  Surgeon: Serafina Mitchell, MD;  Location: [facility];  Service: Vascular;  Laterality: Left;    Social History: Social History   Tobacco Use   Smoking status: Former    Packs/day: 1.00    Years: 35.00    Pack years: 35.00    Types: Cigarettes    Quit date: 12/13/2015    Years since quitting: 5.2   Smokeless tobacco: Former    Quit date: 12/13/2015  Substance Use Topics   Alcohol use: Yes    Alcohol/week: 0.0 standard drinks    Comment: rare   Drug use: No   Additional social history: 20 py smoking Hx, reports drinking 1 beer every other day, denies illicit drug  use Please also refer to relevant sections of EMR.  Family History: Family History  Problem Relation Age of Onset   Heart attack Paternal Grandfather    Heart failure Father    Atrial fibrillation Father     Allergies and Medications: No Known Allergies No current facility-administered medications on file prior to encounter.   Current Outpatient Medications on File Prior to Encounter  Medication Sig Dispense Refill   amoxicillin-clavulanate (AUGMENTIN) 875-125 MG tablet Take one tablet po BID for 7 days (Patient not taking: Reported on 03/03/2021) 14 tablet 0   aspirin 81 MG EC tablet Take 1 tablet (81 mg total) by mouth daily. 30 tablet    atorvastatin (LIPITOR) 80 MG tablet TAKE 1 TABLET BY MOUTH DAILY. 90 tablet 3   carvedilol (COREG) 6.25 MG tablet TAKE 1 TABLET BY MOUTH 2 TIMES DAILY WITH A MEAL. 180 tablet 3   furosemide (LASIX) 20 MG tablet Take 1 tablet by mouth every morning for the next 3 days then 1 every morning as needed edema 30 tablet 0   gabapentin (NEURONTIN) 300 MG capsule One 2 times a day late afternoon and evening 60 capsule 3   lisinopril (ZESTRIL) 10 MG tablet TAKE 1 TABLET BY MOUTH AT BEDTIME. 90 tablet 3   spironolactone (ALDACTONE) 25 MG tablet TAKE 1/2 TABLET BY MOUTH DAILY 45 tablet 3    Objective: BP (!) 144/84   Pulse 67   Temp 98 F (36.7 C) (Oral)   Resp 15   SpO2 100%  Physical Exam Vitals reviewed.  Eyes:     Extraocular Movements: Extraocular movements intact.     Pupils: Pupils are equal, round, and reactive to light.  Neck:     Comments: L CEA surgical scar  Cardiovascular:     Rate and Rhythm: Normal rate and regular rhythm.     Pulses: Normal pulses.     Heart sounds: Normal heart sounds. No murmur heard. Pulmonary:     Effort: Pulmonary effort is normal.     Breath sounds: Normal breath sounds.  Abdominal:     General: Bowel sounds are normal.     Palpations: Abdomen is soft.     Tenderness: There is no abdominal tenderness.   Skin:    General: Skin is warm and dry.  Neurological:     General: No focal deficit present.     Mental Status: He is alert and oriented to person, place, and time.     Cranial Nerves: No cranial nerve deficit.     Comments: Strength 5/5 in UE and LE.     Labs and Imaging: CBC BMET  Recent Labs  Lab 03/08/21 1326 03/08/21 1340  WBC 7.4  --   HGB 15.4 15.3  HCT 44.9 45.0  PLT 243  --    Recent Labs  Lab 03/08/21 1326 03/08/21 1340  NA  136 140  K 4.6 4.5  CL 105 103  CO2 26  --   BUN 11 11  CREATININE 0.98 0.90  GLUCOSE 104* 100*  CALCIUM 9.0  --      EKG: NSR with non-specific ST changes, indicative of likely anterior previous infarct   Corky Sox PGY-1, Psychiatry FPTS Intern pager: [phone], text pages welcome  FPTS Upper-Level Resident Addendum   I have independently interviewed and examined the patient. I have discussed the above with the original author and agree with their documentation. I have made addition edits as necessaryu. Please see also any attending notes.   Gladys Damme, M.D. PGY-2, [facility] 03/08/2021 6:12 PM  FPTS Service pager: [phone] (text pages welcome through [facility])

## 2021-03-08 NOTE — Progress Notes (Signed)
Pt arrived on unit pt oriented to unit

## 2021-03-08 NOTE — ED Triage Notes (Signed)
Patient complains of worsening intermittent left arm tingling, left face numbness, ataxia, and blurred vision that started on Saturday. No facial droop, speech is clear.

## 2021-03-08 NOTE — ED Provider Notes (Addendum)
[facility] EMERGENCY DEPARTMENT Provider Note   CSN: [id number] Arrival date & time: 03/08/21  1112     History Chief Complaint  Patient presents with   Blurred Vision    [name] [name] is a 60 y.o. male with PMH ischemic cardiomyopathy with EF 35 to 40%, A. fib, CAD status post CABG who presents to the emergency department for evaluation of left arm tingling, left face numbness, agraphia and blurred vision.  Patient states that he initially had a headache with left arm tingling and left face numbness on Saturday 03/05/21.  Patient states that he has been on vacation and has not needed to write, but went to work today and had significant difficulty writing with associated agraphia.  Patient states that his last known well would technically be on Saturday.  In regards to the patient's "blurry vision" this is how he has describing his agraphia.  Denies fever, chest pain, shortness of breath, Donnell pain, nausea, vomiting or other systemic symptoms.  HPI     Past Medical History:  Diagnosis Date   Acute systolic congestive heart failure (HCC)    Atrial fibrillation with rapid ventricular response ([place]) 12/13/2015   "had a heart monitor and can't find a source for the a. fib"   Cardiomyopathy ([place]) 12/13/2015   Cardiomyopathy, ischemic 12/14/2015   a. EF 20-25% in 12/2015 b. EF at 35-40% by repeat echo in 10/2016   Coronary artery disease involving native coronary artery with unstable angina pectoris ([place]) 12/15/2015   Hypercholesterolemia 12/15/2015   Mitral regurgitation 12/14/2015   moderate   NSTEMI (non-ST elevated myocardial infarction) (HCC)    PONV (postoperative nausea and vomiting)    "sick with very first surgery but nothing since" in 1990s   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 12/17/2015   LIMA to LAD, SVG to OM, SVG to PDA, EVH via right thigh with clipping of LA appendage   Tobacco abuse     Patient Active Problem List   Diagnosis Date Noted    Colon cancer screening    Polyp of colon    Hypertensive emergency 04/02/2016   Acute on chronic combined systolic and diastolic CHF, NYHA class 4 ([place]) 04/02/2016   Acute kidney injury ([place]) 04/02/2016   Acute respiratory failure with hypoxia ([place]) 04/02/2016   Carotid stenosis, asymptomatic 02/09/2016   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 12/17/2015   Hypercholesterolemia 12/15/2015   Elevated troponin 12/15/2015   Coronary artery disease involving native coronary artery with unstable angina pectoris ([place]) 12/15/2015   Cardiomyopathy, ischemic 12/14/2015   Mitral regurgitation 12/14/2015   Atrial fibrillation with rapid ventricular response ([place]) 12/13/2015   Cardiomyopathy ([place]) 12/13/2015   Leukocytosis 12/13/2015   Tobacco abuse     Past Surgical History:  Procedure Laterality Date   CARDIAC CATHETERIZATION N/A 12/15/2015   Procedure: Right/Left Heart Cath and Coronary Angiography;  Surgeon: Wellington Hampshire, MD;  Location: [facility];  Service: Cardiovascular;  Laterality: N/A;   CLIPPING OF ATRIAL APPENDAGE Left 12/17/2015   Procedure: CLIPPING OF ATRIAL APPENDAGE;  Surgeon: [name], MD;  Location: [place];  Service: Open Heart Surgery;  Laterality: Left;   COLONOSCOPY WITH PROPOFOL N/A 06/21/2016   Procedure: COLONOSCOPY WITH PROPOFOL;  Surgeon: Manus Gunning, MD;  [facility];  Service: Gastroenterology;  Laterality: N/A;   CORONARY ARTERY BYPASS GRAFT N/A 12/17/2015   Procedure: CORONARY ARTERY BYPASS GRAFTING (CABG) x three , using left  internal mammary artery and right leg greater saphenous vein;  Surgeon: [name], MD;  Location: [facility];  Service: Open Heart Surgery;  Laterality: N/A;   ENDARTERECTOMY Left 02/09/2016   Procedure: LEFT CAROTID ENDARTERECTOMY;  Surgeon: Serafina Mitchell, MD;  Location: [facility];  Service: Vascular;  Laterality: Left;   KNEE ARTHROSCOPY Right 1991   PATCH ANGIOPLASTY Left 02/09/2016   Procedure: PATCH  ANGIOPLASTY USING Rueben Bash BIOLOGIC PATCH;  Surgeon: Serafina Mitchell, MD;  Location: [facility];  Service: Vascular;  Laterality: Left;       Family History  Problem Relation Age of Onset   Heart attack Paternal Grandfather    Heart failure Father    Atrial fibrillation Father     Social History   Tobacco Use   Smoking status: Former    Packs/day: 1.00    Years: 35.00    Pack years: 35.00    Types: Cigarettes    Quit date: 12/13/2015    Years since quitting: 5.2   Smokeless tobacco: Former    Quit date: 12/13/2015  Substance Use Topics   Alcohol use: Yes    Alcohol/week: 0.0 standard drinks    Comment: rare   Drug use: No    Home Medications Prior to Admission medications   Medication Sig Start Date End Date Taking? Authorizing Provider  amoxicillin-clavulanate (AUGMENTIN) 875-125 MG tablet Take one tablet po BID for 7 days Patient not taking: Reported on 03/03/2021 01/28/21   Kathyrn Drown, MD  aspirin 81 MG EC tablet Take 1 tablet (81 mg total) by mouth daily. 01/17/16   [name], MD  atorvastatin (LIPITOR) 80 MG tablet TAKE 1 TABLET BY MOUTH DAILY. 05/20/20 05/20/21  Strader, Fransisco Hertz, PA-C  carvedilol (COREG) 6.25 MG tablet TAKE 1 TABLET BY MOUTH 2 TIMES DAILY WITH A MEAL. 05/20/20 05/20/21  Strader, Fransisco Hertz, PA-C  furosemide (LASIX) 20 MG tablet Take 1 tablet by mouth every morning for the next 3 days then 1 every morning as needed edema 02/01/21   Kathyrn Drown, MD  gabapentin (NEURONTIN) 300 MG capsule One 2 times a day late afternoon and evening 01/28/21   Kathyrn Drown, MD  lisinopril (ZESTRIL) 10 MG tablet TAKE 1 TABLET BY MOUTH AT BEDTIME. 05/20/20 05/20/21  Strader, Fransisco Hertz, PA-C  spironolactone (ALDACTONE) 25 MG tablet TAKE 1/2 TABLET BY MOUTH DAILY 05/20/20 05/20/21  Erma Heritage, PA-C    Allergies    Patient has no known allergies.  Review of Systems   Review of Systems  Constitutional:  Negative for chills and fever.  HENT:  Negative  for ear pain and sore throat.   Eyes:  Negative for pain and visual disturbance.  Respiratory:  Negative for cough and shortness of breath.   Cardiovascular:  Negative for chest pain and palpitations.  Gastrointestinal:  Negative for abdominal pain and vomiting.  Genitourinary:  Negative for dysuria and hematuria.  Musculoskeletal:  Negative for arthralgias and back pain.  Skin:  Negative for color change and rash.  Neurological:  Positive for numbness. Negative for seizures and syncope.       Agraphia  All other systems reviewed and are negative.  Physical Exam Updated Vital Signs BP (!) 183/108 (BP Location: Left Arm)   Pulse 69   Temp 98 F (36.7 C) (Oral)   Resp 16   SpO2 100%   Physical Exam Vitals and nursing note reviewed.  Constitutional:      Appearance: He is well-developed.  HENT:     Head: Normocephalic and atraumatic.  Eyes:     Conjunctiva/sclera: Conjunctivae normal.  Cardiovascular:     Rate and Rhythm: Normal rate and regular rhythm.     Heart sounds: No murmur heard. Pulmonary:     Effort: Pulmonary effort is normal. No respiratory distress.     Breath sounds: Normal breath sounds.  Abdominal:     Palpations: Abdomen is soft.     Tenderness: There is no abdominal tenderness.  Musculoskeletal:     Cervical back: Neck supple.  Skin:    General: Skin is warm and dry.  Neurological:     Mental Status: He is alert and oriented to person, place, and time.     Cranial Nerves: No cranial nerve deficit.     Sensory: Sensory deficit (Subjective right-sided numbness) present.     Motor: No weakness.    ED Results / Procedures / Treatments   Labs (all labs ordered are listed, but only abnormal results are displayed) Labs Reviewed  PROTIME-INR  APTT  CBC  DIFFERENTIAL  COMPREHENSIVE METABOLIC PANEL  I-STAT CHEM 8, ED  CBG MONITORING, ED    EKG None  Radiology CT HEAD WO CONTRAST  Result Date: 03/08/2021 CLINICAL DATA:  Blurry vision. EXAM: CT  HEAD WITHOUT CONTRAST TECHNIQUE: Contiguous axial images were obtained from the base of the skull through the vertex without intravenous contrast. COMPARISON:  None. FINDINGS: Brain: Right occipital lobe hypodensities with loss of the normal gray-white matter differentiation (series 4, image 14; series 6, image 63). No evidence of hemorrhage, hydrocephalus, extra-axial collection or mass lesion/mass effect. Scattered periventricular and subcortical white matter hypodensities are nonspecific, but favored to reflect chronic microvascular ischemic changes. Vascular: Atherosclerotic vascular calcification of the carotid siphons. No hyperdense vessel. Skull: Normal. Negative for fracture or focal lesion. Sinuses/Orbits: No acute finding. Other: None. IMPRESSION: 1. Right occipital lobe acute to subacute infarct. Electronically Signed   By: Titus Dubin M.D.   On: 03/08/2021 12:11    Procedures Procedures   Medications Ordered in ED Medications  sodium chloride flush (NS) 0.9 % injection 3 mL (has no administration in time range)    ED Course  I have reviewed the triage vital signs and the nursing notes.  Pertinent labs & imaging results that were available during my care of the patient were reviewed by me and considered in my medical decision making (see chart for details).    MDM Rules/Calculators/A&P                           Patient seen in the emergency department for evaluation of numbness and agraphia.  Physical exam reveals right-sided subjective numbness but no additional motor or cranial nerve deficits.  Laboratory evaluation is unremarkable.  Initial head CT with right occipital infarct.  Stroke alert activated as the patient's last known well was not entirely clear, and I spoke with neurology who is recommending CT angio imaging, admission to medicine and likely MRI.  CT angio shows severe stenosis at the right P1 P2 junction decreased flow to the right PCA.  Patient is unfortunately  outside of the window for thrombolytics or thrombectomy.  Patient admitted to medicine. Final Clinical Impression(s) / ED Diagnoses Final diagnoses:  None    Rx / DC Orders ED Discharge Orders     None        Brando Taves, Debe Coder, MD 03/08/21 [place], MD 03/08/21 1546

## 2021-03-09 ENCOUNTER — Observation Stay (HOSPITAL_COMMUNITY): Payer: 59

## 2021-03-09 ENCOUNTER — Observation Stay (HOSPITAL_BASED_OUTPATIENT_CLINIC_OR_DEPARTMENT_OTHER): Payer: 59

## 2021-03-09 ENCOUNTER — Other Ambulatory Visit (HOSPITAL_COMMUNITY): Payer: Self-pay

## 2021-03-09 ENCOUNTER — Encounter: Payer: Self-pay | Admitting: Family Medicine

## 2021-03-09 DIAGNOSIS — F17200 Nicotine dependence, unspecified, uncomplicated: Secondary | ICD-10-CM

## 2021-03-09 DIAGNOSIS — I6389 Other cerebral infarction: Secondary | ICD-10-CM

## 2021-03-09 DIAGNOSIS — I5020 Unspecified systolic (congestive) heart failure: Secondary | ICD-10-CM

## 2021-03-09 DIAGNOSIS — I63231 Cerebral infarction due to unspecified occlusion or stenosis of right carotid arteries: Secondary | ICD-10-CM

## 2021-03-09 DIAGNOSIS — Z7982 Long term (current) use of aspirin: Secondary | ICD-10-CM | POA: Diagnosis not present

## 2021-03-09 DIAGNOSIS — Z79899 Other long term (current) drug therapy: Secondary | ICD-10-CM | POA: Diagnosis not present

## 2021-03-09 DIAGNOSIS — Z951 Presence of aortocoronary bypass graft: Secondary | ICD-10-CM | POA: Diagnosis not present

## 2021-03-09 DIAGNOSIS — I639 Cerebral infarction, unspecified: Secondary | ICD-10-CM | POA: Diagnosis not present

## 2021-03-09 DIAGNOSIS — I6521 Occlusion and stenosis of right carotid artery: Secondary | ICD-10-CM | POA: Diagnosis not present

## 2021-03-09 DIAGNOSIS — I63531 Cerebral infarction due to unspecified occlusion or stenosis of right posterior cerebral artery: Secondary | ICD-10-CM | POA: Diagnosis not present

## 2021-03-09 DIAGNOSIS — F17210 Nicotine dependence, cigarettes, uncomplicated: Secondary | ICD-10-CM | POA: Diagnosis not present

## 2021-03-09 DIAGNOSIS — Z9889 Other specified postprocedural states: Secondary | ICD-10-CM | POA: Diagnosis not present

## 2021-03-09 DIAGNOSIS — I2510 Atherosclerotic heart disease of native coronary artery without angina pectoris: Secondary | ICD-10-CM | POA: Diagnosis not present

## 2021-03-09 LAB — URINALYSIS, ROUTINE W REFLEX MICROSCOPIC
Bacteria, UA: NONE SEEN
Bilirubin Urine: NEGATIVE
Glucose, UA: NEGATIVE mg/dL
Ketones, ur: NEGATIVE mg/dL
Leukocytes,Ua: NEGATIVE
Nitrite: NEGATIVE
Protein, ur: NEGATIVE mg/dL
Specific Gravity, Urine: 1.021 (ref 1.005–1.030)
pH: 5 (ref 5.0–8.0)

## 2021-03-09 LAB — BASIC METABOLIC PANEL
Anion gap: 5 (ref 5–15)
BUN: 8 mg/dL (ref 6–20)
CO2: 29 mmol/L (ref 22–32)
Calcium: 9.1 mg/dL (ref 8.9–10.3)
Chloride: 104 mmol/L (ref 98–111)
Creatinine, Ser: 0.92 mg/dL (ref 0.61–1.24)
GFR, Estimated: >60 mL/min (ref >60)
Glucose, Bld: 142 mg/dL — ABNORMAL HIGH (ref 70–99)
Potassium: 4.4 mmol/L (ref 3.5–5.1)
Sodium: 138 mmol/L (ref 135–145)

## 2021-03-09 LAB — LIPID PANEL
Cholesterol: 114 mg/dL (ref 0–200)
HDL: 26 mg/dL — ABNORMAL LOW (ref >40)
LDL Cholesterol: 63 mg/dL (ref 0–99)
Total CHOL/HDL Ratio: 4.4 RATIO
Triglycerides: 125 mg/dL (ref <150)
VLDL: 25 mg/dL (ref 0–40)

## 2021-03-09 LAB — ECHOCARDIOGRAM COMPLETE
AR max vel: 2.56 cm2
AV Area VTI: 2.2 cm2
AV Area mean vel: 2.51 cm2
AV Mean grad: 4 mmHg
AV Peak grad: 6.7 mmHg
Ao pk vel: 1.29 m/s
Area-P 1/2: 4.89 cm2
S' Lateral: 5.1 cm
Single Plane A4C EF: 51.6 %

## 2021-03-09 LAB — HEMOGLOBIN A1C
Hgb A1c MFr Bld: 5.8 % — ABNORMAL HIGH (ref 4.8–5.6)
Mean Plasma Glucose: 119.76 mg/dL

## 2021-03-09 LAB — HIV ANTIBODY (ROUTINE TESTING W REFLEX): HIV Screen 4th Generation wRfx: NONREACTIVE

## 2021-03-09 MED ORDER — ASPIRIN EC 325 MG PO TBEC: 325.0000 mg | DELAYED_RELEASE_TABLET | Freq: Every day | ORAL | Status: DC

## 2021-03-09 MED ORDER — CLOPIDOGREL BISULFATE 300 MG PO TABS
300.0000 mg | ORAL_TABLET | Freq: Once | ORAL | Status: AC
  Administered 2021-03-09: 300 mg via ORAL
  Filled 2021-03-09: qty 1

## 2021-03-09 MED ORDER — ASPIRIN 325 MG PO TBEC
325.0000 mg | DELAYED_RELEASE_TABLET | Freq: Every day | ORAL | 1 refills | Status: DC
  Filled 2021-03-09: qty 30, 30d supply, fill #0

## 2021-03-09 MED ORDER — DAPAGLIFLOZIN PROPANEDIOL 10 MG PO TABS
10.0000 mg | ORAL_TABLET | Freq: Every day | ORAL | 1 refills | Status: DC
  Filled 2021-03-09: qty 30, 30d supply, fill #0

## 2021-03-09 MED ORDER — CLOPIDOGREL BISULFATE 75 MG PO TABS
75.0000 mg | ORAL_TABLET | Freq: Every day | ORAL | Status: DC
Start: 2021-03-10 — End: 2021-03-09
  Filled 2021-03-09 (×2): qty 1

## 2021-03-09 MED ORDER — CLOPIDOGREL BISULFATE 75 MG PO TABS
75.0000 mg | ORAL_TABLET | Freq: Every day | ORAL | 1 refills | Status: DC
  Filled 2021-03-09: qty 30, 30d supply, fill #0

## 2021-03-09 MED ORDER — DAPAGLIFLOZIN PROPANEDIOL 10 MG PO TABS
10.0000 mg | ORAL_TABLET | Freq: Every day | ORAL | Status: DC
  Administered 2021-03-09: 10 mg via ORAL
  Filled 2021-03-09: qty 1

## 2021-03-09 NOTE — Progress Notes (Addendum)
Carotid duplex bilateral study completed.  See CV Proc for preliminary results report.   Darlin Coco, RDMS, RVT

## 2021-03-09 NOTE — TOC Benefit Eligibility Note (Signed)
Patient Teacher, English as a foreign language completed.    The patient is currently admitted and upon discharge could be taking Entresto 24-26 mg.  The current 30 day co-pay is, $118.54.   The patient is currently admitted and upon discharge could be taking Farxiga 10 mg.  The current 30 day co-pay is, $15.00. [place] 10 MG TABLET paid 87.31 toward plan copay  The patient is currently admitted and upon discharge could be taking Jardiance 10 mg.  The current 30 day co-pay is, $42.99. [place] 10 MG TABLET paid 40.01 toward plan copay  The patient is insured through [place], [place] Patient Advocate Specialist [place] Team Direct Number: [phone]  Fax: [phone]

## 2021-03-09 NOTE — TOC Transition Note (Signed)
Transition of Care [facility]) - CM/SW Discharge Note   Patient Details  Name: [name] [name] MRN: [id number] Date of Birth: [birth year]  [facility]) CM/SW Contact:  Pollie Friar, RN Phone Number: 03/09/2021, 3:01 PM   Clinical Narrative:    Patient is discharging home with outpatient therapy arranged through [facility]. Information on the AVS.  Pt has transportation home.    Final next level of care: OP Rehab Barriers to Discharge: No Barriers Identified   Patient Goals and CMS Choice     Choice offered to / list presented to : Patient, Spouse  Discharge Placement                       Discharge Plan and Services                                     Social Determinants of Health (SDOH) Interventions     Readmission Risk Interventions No flowsheet data found.

## 2021-03-09 NOTE — Evaluation (Signed)
Occupational Therapy Evaluation Patient Details Name: [name] [name] MRN: [id number] DOB: October 24, 1960 Today's Date: 03/09/2021    History of Present Illness [name] [name] is an 60 y.o. male who presented with 3 days of left arm tingling, left face numbness, and trouble writing; CT revealed an acute to subacute right occipital lobe infarction.   PMHx of CAD s/p CABG with clipping of LA appendage, ischemic cardiomyopathy with LVEF of 35-40%, CHF, one prior isolated episode of atrial fibrillation, hypercholesterolemia, carotid stenosis s/p left CEA in 2017 and tobacco abuse   Clinical Impression   PTA pt lives independently with his wife, drives and works in Theatre manager at [employer]. Given visual and mild coordination deficits, recommend follow up with outpt OT at [facility] to facilitate safe return to work. Recommend pt follow up with his eye doctor and to refrain from driving at this time. Pt verbalized understanding. Will follow up prior to DC to provide pt with an HEP.     Follow Up Recommendations  Other (comment) (follow up with eye doctor for a Full Field Visual Assessment; refrain from driving)    Equipment Recommendations       Recommendations for Other Services       Precautions / Restrictions Precautions Precautions: Other (comment) (decreased vision)      Mobility Bed Mobility Overal bed mobility: Independent                  Transfers Overall transfer level: Independent                    Balance Overall balance assessment: No apparent balance deficits (not formally assessed)                                         ADL either performed or assessed with clinical judgement   ADL Overall ADL's : At baseline                                       General ADL Comments: basic ADL tasks; recommend S with medicaiton and financial management     Vision Baseline Vision/History: 1 Wears glasses Patient Visual Report:  No change from baseline Vision Assessment?: Yes Eye Alignment: Within Functional Limits Ocular Range of Motion: Within Functional Limits Alignment/Gaze Preference: Within Defined Limits Tracking/Visual Pursuits: Able to track stimulus in all quads without difficulty Saccades: Within functional limits Convergence: Within functional limits Visual Fields: Left visual field deficit (lower quadrant)     Perception Perception Comments: unorganzied scanning pattern, starting on L, moving toward R; 6 omissions during letter cancellation task on paragraph; minimal difficulty with placing a designated mark on target specific task. Able to complete clock draw without difficulty; slow reading speed; word omission/substitution noted - unsure of baseline reading ability   Praxis      Pertinent Vitals/Pain Pain Assessment: No/denies pain     Hand Dominance Left   Extremity/Trunk Assessment Upper Extremity Assessment Upper Extremity Assessment: LUE deficits/detail LUE Deficits / Details: mild coordination deficits noted but functional; mild dysdiadochokinesis noted with supination/pronation   Lower Extremity Assessment Lower Extremity Assessment: Defer to PT evaluation   Cervical / Trunk Assessment Cervical / Trunk Assessment: Normal   Communication Communication Communication: No difficulties   Cognition Arousal/Alertness: Awake/alert Behavior During Therapy: WFL for tasks  assessed/performed Overall Cognitive Status: Within Functional Limits for tasks assessed                                     General Comments   Wife/son present for education    Exercises     Shoulder Instructions      Home Living Family/patient expects to be discharged to:: Private residence Living Arrangements: Spouse/significant other Available Help at Discharge: Available PRN/intermittently;Family Type of Home: House Home Access: Stairs to enter Technical brewer of Steps: 1 Entrance  Stairs-Rails: None Home Layout: One level     Bathroom Shower/Tub: Corporate investment banker: Handicapped height Bathroom Accessibility: No   Home Equipment: None          Prior Functioning/Environment Level of Independence: Independent        Comments: works in Theatre manager at [employer]        OT Problem List: Decreased coordination;Decreased safety awareness;Impaired vision/perception      OT Treatment/Interventions: Therapeutic exercise;Therapeutic activities;Visual/perceptual remediation/compensation;Patient/family education    OT Goals(Current goals can be found in the care plan section) Acute Rehab OT Goals Patient Stated Goal: to go home OT Goal Formulation: With patient Time For Goal Achievement: 03/23/21 Potential to Achieve Goals: Good  OT Frequency: Min 2X/week   Barriers to D/C:            Co-evaluation              AM-PAC OT "6 Clicks" Daily Activity     Outcome Measure Help from another person eating meals?: None Help from another person taking care of personal grooming?: None Help from another person toileting, which includes using toliet, bedpan, or urinal?: None Help from another person bathing (including washing, rinsing, drying)?: None Help from another person to put on and taking off regular upper body clothing?: None Help from another person to put on and taking off regular lower body clothing?: None 6 Click Score: 24   End of Session Nurse Communication: Other (comment) (DC needs)  Activity Tolerance: Patient tolerated treatment well Patient left: in bed;with call bell/phone within reach;Other (comment);with family/visitor present (talking with MD)  OT Visit Diagnosis: Low vision, both eyes (H54.2);Muscle weakness (generalized) (M62.81)                Time: [id number] OT Time Calculation (min): 30 min Charges:  OT General Charges $OT Visit: 1 Visit OT Evaluation $OT Eval Low Complexity: 1 Low OT  Treatments $Therapeutic Activity: 8-22 mins  Maurie Boettcher, OT/L   Acute OT Clinical Specialist Acute Rehabilitation Services Pager [phone] Office [phone]   [facility] 03/09/2021, 11:14 AM

## 2021-03-09 NOTE — Progress Notes (Signed)
Occupational Therapy Treatment Note  Pt educated on HEP for fine motor/coordination and visual perceptual tasks. All further OT to be addressed in the outpt setting.     03/09/21 1120  OT Visit Information  Last OT Received On 03/09/21  Assistance Needed +1  History of Present Illness [name] [name] is an 60 y.o. male who presented with 3 days of left arm tingling, left face numbness, and trouble writing; CT revealed an acute to subacute right occipital lobe infarction.   PMHx of CAD s/p CABG with clipping of LA appendage, ischemic cardiomyopathy with LVEF of 35-40%, CHF, one prior isolated episode of atrial fibrillation, hypercholesterolemia, carotid stenosis s/p left CEA in 2017 and tobacco abuse  Precautions  Precautions Other (comment)  Precaution Comments vision deficits  Exercises  Exercises Other exercises  Other Exercises  Other Exercises fine motor/coordination HEP  Other Exercises visual perceptual tasks  OT - End of Session  Activity Tolerance Patient tolerated treatment well  Patient left in bed;with call bell/phone within reach;with family/visitor present  Nurse Communication Other (comment) (DC nedds)  OT Assessment/Plan  OT Plan Discharge plan remains appropriate (all further OT to be addressed by outpt)  OT Visit Diagnosis Low vision, both eyes (H54.2);Muscle weakness (generalized) (M62.81)  AM-PAC OT "6 Clicks" Daily Activity Outcome Measure (Version 2)  Help from another person eating meals? 4  Help from another person taking care of personal grooming? 4  Help from another person toileting, which includes using toliet, bedpan, or urinal? 4  Help from another person bathing (including washing, rinsing, drying)? 4  Help from another person to put on and taking off regular upper body clothing? 4  Help from another person to put on and taking off regular lower body clothing? 4  6 Click Score 24  Progressive Mobility  What is the highest level of mobility based on the  progressive mobility assessment? Level 6 (Walks independently in room and hall) - Balance while walking in room without assist - Complete  OT Goal Progression  Progress towards OT goals Goals met/education completed, patient discharged from OT  Acute Rehab OT Goals  Patient Stated Goal to go home  OT Goal Formulation With patient  Time For Goal Achievement 03/23/21  Potential to Achieve Goals Good  ADL Goals  Pt/caregiver will Perform Home Exercise Program With written HEP provided;Independently  OT Time Calculation  OT Start Time (ACUTE ONLY) 1125  OT Stop Time (ACUTE ONLY) 1140  OT Time Calculation (min) 15 min  OT General Charges  $OT Visit 1 Visit  OT Treatments  $Therapeutic Activity 8-22 mins  Maurie Boettcher, OT/L   Acute OT Clinical Specialist [place] Pager [phone] Office [phone]

## 2021-03-09 NOTE — Progress Notes (Signed)
PT Cancellation Note  Patient Details Name: [name] [name] MRN: [id number] DOB: [birth year]   Cancelled Treatment:    Reason Eval/Treat Not Completed: PT screened, no needs identified, will sign off; seen by OT who reports no PT needs.     Reginia Naas 03/09/2021, 12:29 PM Magda Kiel, [place] [id number] [phone] 03/09/2021

## 2021-03-09 NOTE — Telephone Encounter (Signed)
Nurses Upon patient's discharge lets make sure that we do a follow-up hospital visit with myself  Please feel free to forward this message to [place] that you have had a stroke.  Hopefully you will fully recover from that.  I did review your echo.  The squeezing of your heart is adequate and within a low normal range.  More concerning is the narrowing of the carotid arteries.  Currently they have vascular surgery coming by to talk with you.  This would be most appropriate.  There is a possibility that they will want to do surgery to open up those arteries to lessen your risk of further strokes.  From what I see it does appear that they are doing the proper evaluation of this issue.  We look forward to seeing you after your hospital stay  Thanks-Dr. Nicki Reaper

## 2021-03-09 NOTE — Discharge Instructions (Addendum)
It was a pleasure taking care of you during your hospital stay!  You had a stroke. To best prevent this event from occurring again, it is important that you take the below medications and follow up with Dr. Trula Slade and Dr. [name]. - Take plavix 75 mg once a day - Take aspirin 325 mg once a day - Take atorvastatin 80 mg once a day - Take farxiga 10 mg once a day - Talk to your primary care physician about medications to help quit smoking - Exercising by walking for 30 minutes at least 3 times per week and decreasing fried foods  You have a surgery scheduled with Dr. Trula Slade here at [facility] on 03/11/21 at [facility]  If you have changes in your vision, trouble moving your limbs (or if they feel heavy or numb), or trouble talking or drinking, please go to your nearest emergency room to be evaluated for stroke.

## 2021-03-09 NOTE — Hospital Course (Addendum)
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 Patient was found to have multifocal acute infarcts in the right occipital lobe, right parietal love, and right frontal lobe. CTA head and neck found right carotid stenosis, confirmed on carotid US read up to 80-99%. Etiology is most likely due to right carotid disease. Patient was on ASA 81 mg and atorvastatin '80mg'$  at home. His lipids show that he is compliant with medication, Total cholesterol 114, LDL 61. He may be candidate for PCSK9i, consider referral to hyperlipidemia clinic. Echo showed improved LVEF at 50%, see report below. EKG with sinus rhythm. Review of telemetry revealed no arrhythmias. Focal neuro deficits resolved by time of admission, and patient had no focal deficits at time of discharge. He will follow up with PT at [facility]. Neurology reviewed patient and recommended DAPT with '325mg'$  qd, Plavix 75 mg qd, continue statin. Patient has been seen by Dr. Trula Slade for left carotid endarterectomy in 2017, he has been set up for outpatient right carotid endarterectomy on Friday, 03/11/2021.  HFrEF  s/p CABG x3  Patient previously had reduced EF, but echo this hospitalization shows improvement to 50%. Continue home medications of lisinopril 10 mg, coreg 6.'25mg'$  BID, spironolactone 25 mg qd. Added SGLT2i Farxiga 10 mg for mortality benefits. Patient could not get entresto due to cost. Consider PA or application for patient assistance.  Hypertension Home medications include lisinopril 10 mg, coreg 6.'25mg'$  BID, spironolactone 12.'5mg'$ . Patient had elevated Bps to 170s/90s during hospitalization. Consider titrating medications outpatient.  Tobacco Use Patient reports that he quit smoking after his CABG in 2017, but has since restarted smoking and uses 1/2 ppd. Recommend cessation and  offering medications to support cessation.  Follow up: Continue DAPT: ASA 325 mg daily, Plavix '75mg'$  daily Patient to have R CEA with Dr. Trula Slade on 03/11/21 Follow up with Neurology outpatient  Consider PCSK9i for further lipid control Continue farxiga 10 mg daily for heart failure Check blood pressure for titration of medications Recommend motivational interviewing and offering tobacco cessation support

## 2021-03-09 NOTE — Discharge Summary (Signed)
[facility] Discharge Summary  Patient name: [name] [name] Medical record number: [id number] Date of birth: Oct 20, 1960 Age: 60 y.o. Gender: male Date of Admission: 03/08/2021  Date of Discharge: 03/09/21 Admitting Physician: Gladys Damme, MD  Primary Care Provider: Kathyrn Drown, MD Consultants: Neurology, VVS  Indication for Hospitalization: stroke  Discharge Diagnoses/Problem List:  Stroke HFrEF s/p CABGx3 HTN Tobacco use R carotid stenosis Post herpetic neuralgia  Disposition: Home  Discharge Condition: stable and improved  Discharge Exam:  Temp:  [97.4 F (36.3 C)-98.1 F (36.7 C)] 97.7 F (36.5 C) (09/07 0735) Pulse Rate:  [65-89] 76 (09/07 0735) Resp:  [10-20] 18 (09/07 0735) BP: (144-183)/(84-108) 173/98 (09/07 0735) SpO2:  [96 %-100 %] 97 % (09/07 0735) Physical Exam: Gen: age-appropriate WM, resting in chair, NAD, WNWD Card: RRR, no m/r/g, 2+ radial pulses Resp: CTAB, no iWOB Ext: warm, 2+ PD Neuro: Cranial Nerves II: PERRL.  III,IV, VI: EOMI without ptosis or diplopia.  V: Facial sensation is symmetric to touch VII: Facial movement is symmetric.  VIII: Hearing is intact to voice X: Palate elevates symmetrically XI: Shoulder shrug is symmetric. XII: Tongue is midline without atrophy or fasciculations.  Motor: Tone is normal. Bulk is normal. 5/5 strength was present in all four extremities.  Sensory: Sensation is symmetric to light touch in the arms and legs. Deep Tendon Reflexes: 2+ and symmetric in the biceps and patellae.   Brief Hospital Course:  Acute ischemic stroke, likely symptomatic right carotid atherosclerosis 60 yo man presented with headaches from 9/3 to day of admission with numbness of left ear, agraphia in left hand, and heaviness of left upper extremity. Patient was found to have multifocal acute infarcts in the right occipital lobe, right parietal love, and right frontal lobe. CTA head and neck found right  carotid stenosis, confirmed on carotid US read up to 80-99%. Etiology is most likely due to right carotid disease. Patient was on ASA 81 mg and atorvastatin '80mg'$  at home. His lipids show that he is compliant with medication, Total cholesterol 114, LDL 61. He may be candidate for PCSK9i, consider referral to hyperlipidemia clinic. Echo showed improved LVEF at 50%, see report below. EKG with sinus rhythm. Review of telemetry revealed no arrhythmias. Focal neuro deficits resolved by time of admission, and patient had no focal deficits at time of discharge. He will follow up with PT at [facility]. Neurology reviewed patient and recommended DAPT with '325mg'$  qd, Plavix 75 mg qd, continue statin. Patient has been seen by Dr. Trula Slade for left carotid endarterectomy in 2017, he has been set up for outpatient right carotid endarterectomy on Friday, 03/11/2021.  HFrEF  s/p CABG x3  Patient previously had reduced EF, but echo this hospitalization shows improvement to 50%. Continue home medications of lisinopril 10 mg, coreg 6.'25mg'$  BID, spironolactone 25 mg qd. Added SGLT2i Farxiga 10 mg for mortality benefits. Patient could not get entresto due to cost. Consider PA or application for patient assistance.  Hypertension Home medications include lisinopril 10 mg, coreg 6.'25mg'$  BID, spironolactone 12.'5mg'$ . Patient had elevated Bps to 170s/90s during hospitalization. Consider titrating medications outpatient.  Tobacco Use Patient reports that he quit smoking after his CABG in 2017, but has since restarted smoking and uses 1/2 ppd. Recommend cessation and offering medications to support cessation.  Follow up: Continue DAPT: ASA 325 mg daily, Plavix '75mg'$  daily Patient to have R CEA with Dr. Trula Slade on 03/11/21 Follow up with Neurology outpatient  Consider PCSK9i for further lipid  control Continue farxiga 10 mg daily for heart failure Check blood pressure for titration of medications Recommend motivational interviewing and  offering tobacco cessation support   Significant Procedures: none  Significant Labs and Imaging:  Recent Labs  Lab 03/08/21 1326 03/08/21 1340  WBC 7.4  --   HGB 15.4 15.3  HCT 44.9 45.0  PLT 243  --    Recent Labs  Lab 03/08/21 1326 03/08/21 1340 03/09/21 0801  NA 136 140 138  K 4.6 4.5 4.4  CL 105 103 104  CO2 26  --  29  GLUCOSE 104* 100* 142*  BUN '11 11 8  '$ CREATININE 0.98 0.90 0.92  CALCIUM 9.0  --  9.1  ALKPHOS 120  --   --   AST 21  --   --   ALT 20  --   --   ALBUMIN 3.7  --   --    CT Angio Head W or Wo Contrast  Result Date: 03/08/2021 CLINICAL DATA:  Neuro deficit, stroke suspected EXAM: CT ANGIOGRAPHY HEAD AND NECK TECHNIQUE: Multidetector CT imaging of the head and neck was performed using the standard protocol during bolus administration of intravenous contrast. Multiplanar CT image reconstructions and MIPs were obtained to evaluate the vascular anatomy. Carotid stenosis measurements (when applicable) are obtained utilizing NASCET criteria, using the distal internal carotid diameter as the denominator. CONTRAST:  92m OMNIPAQUE IOHEXOL 350 MG/ML SOLN COMPARISON:  Same-day noncontrast CT head FINDINGS: CTA NECK FINDINGS Aortic arch: There is mild calcified atherosclerotic plaque of the aortic arch. The branching pattern is normal. Postsurgical changes reflecting CABG are noted. Right carotid system: There is soft and calcified atherosclerotic plaque of the proximal right internal carotid artery resulting in proximally 70-80% stenosis. A hypodense lesion extends superiorly from the level of the stenosis likely reflecting mural adherent thrombus (7-216, 8-113). The distal internal carotid artery is patent. The external carotid artery is patent. There is no dissection or aneurysm. Left carotid system: There is predominantly soft atherosclerotic plaque of the proximal left internal carotid artery resulting in up to 20-30% stenosis. There is no dissection or aneurysm.  Vertebral arteries: There is mild calcified atherosclerotic plaque at the origin of the right vertebral artery. The vertebral arteries are otherwise patent bilaterally. There is no hemodynamically significant stenosis or occlusion. There is no dissection or aneurysm. Skeleton: There is degenerative change of the cervical spine most advanced at C5-C6 and C6-C7. There is no acute osseous abnormality or aggressive osseous lesion. Other neck: A right palatine tonsillith is noted. Upper chest: There is scarring in the lung apices. Review of the MIP images confirms the above findings CTA HEAD FINDINGS Anterior circulation: There is calcification of the bilateral cavernous ICAs resulting in up to mild stenosis on the left. There is no high-grade stenosis or occlusion. The bilateral MCAs and ACAs are patent. There is no aneurysm. Posterior circulation: The V4 segments of the vertebral arteries are patent with minimal calcified atherosclerotic plaque on the right. The basilar artery is patent. There is focal severe stenosis of the right PCA at the P1/P2 junction (7-100) with diminutive enhancement throughout the remainder of the right PCA. The left PCA is patent. Venous sinuses: As permitted by contrast timing, patent. Anatomic variants: None. Review of the MIP images confirms the above findings IMPRESSION: 1. Focal severe stenosis at the right P1/P2 junction with diminutive enhancement of the right PCA distal to this point. 2. Atherosclerotic plaque at the proximal right internal carotid artery resulting in 70-80% stenosis.  A hypodense lesion extends superiorly from the level of the stenosis likely reflecting mural adherent thrombus. 3. Approximally 20-30% stenosis of the proximal left internal carotid artery. Electronically Signed   By: Valetta Mole M.D.   On: 03/08/2021 14:30   CT HEAD WO CONTRAST  Result Date: 03/08/2021 CLINICAL DATA:  Blurry vision. EXAM: CT HEAD WITHOUT CONTRAST TECHNIQUE: Contiguous axial images  were obtained from the base of the skull through the vertex without intravenous contrast. COMPARISON:  None. FINDINGS: Brain: Right occipital lobe hypodensities with loss of the normal gray-white matter differentiation (series 4, image 14; series 6, image 63). No evidence of hemorrhage, hydrocephalus, extra-axial collection or mass lesion/mass effect. Scattered periventricular and subcortical white matter hypodensities are nonspecific, but favored to reflect chronic microvascular ischemic changes. Vascular: Atherosclerotic vascular calcification of the carotid siphons. No hyperdense vessel. Skull: Normal. Negative for fracture or focal lesion. Sinuses/Orbits: No acute finding. Other: None. IMPRESSION: 1. Right occipital lobe acute to subacute infarct. Electronically Signed   By: Titus Dubin M.D.   On: 03/08/2021 12:11   CT Angio Neck W and/or Wo Contrast  Result Date: 03/08/2021 CLINICAL DATA:  Neuro deficit, stroke suspected EXAM: CT ANGIOGRAPHY HEAD AND NECK TECHNIQUE: Multidetector CT imaging of the head and neck was performed using the standard protocol during bolus administration of intravenous contrast. Multiplanar CT image reconstructions and MIPs were obtained to evaluate the vascular anatomy. Carotid stenosis measurements (when applicable) are obtained utilizing NASCET criteria, using the distal internal carotid diameter as the denominator. CONTRAST:  31m OMNIPAQUE IOHEXOL 350 MG/ML SOLN COMPARISON:  Same-day noncontrast CT head FINDINGS: CTA NECK FINDINGS Aortic arch: There is mild calcified atherosclerotic plaque of the aortic arch. The branching pattern is normal. Postsurgical changes reflecting CABG are noted. Right carotid system: There is soft and calcified atherosclerotic plaque of the proximal right internal carotid artery resulting in proximally 70-80% stenosis. A hypodense lesion extends superiorly from the level of the stenosis likely reflecting mural adherent thrombus (7-216, 8-113). The  distal internal carotid artery is patent. The external carotid artery is patent. There is no dissection or aneurysm. Left carotid system: There is predominantly soft atherosclerotic plaque of the proximal left internal carotid artery resulting in up to 20-30% stenosis. There is no dissection or aneurysm. Vertebral arteries: There is mild calcified atherosclerotic plaque at the origin of the right vertebral artery. The vertebral arteries are otherwise patent bilaterally. There is no hemodynamically significant stenosis or occlusion. There is no dissection or aneurysm. Skeleton: There is degenerative change of the cervical spine most advanced at C5-C6 and C6-C7. There is no acute osseous abnormality or aggressive osseous lesion. Other neck: A right palatine tonsillith is noted. Upper chest: There is scarring in the lung apices. Review of the MIP images confirms the above findings CTA HEAD FINDINGS Anterior circulation: There is calcification of the bilateral cavernous ICAs resulting in up to mild stenosis on the left. There is no high-grade stenosis or occlusion. The bilateral MCAs and ACAs are patent. There is no aneurysm. Posterior circulation: The V4 segments of the vertebral arteries are patent with minimal calcified atherosclerotic plaque on the right. The basilar artery is patent. There is focal severe stenosis of the right PCA at the P1/P2 junction (7-100) with diminutive enhancement throughout the remainder of the right PCA. The left PCA is patent. Venous sinuses: As permitted by contrast timing, patent. Anatomic variants: None. Review of the MIP images confirms the above findings IMPRESSION: 1. Focal severe stenosis at the right P1/P2 junction  with diminutive enhancement of the right PCA distal to this point. 2. Atherosclerotic plaque at the proximal right internal carotid artery resulting in 70-80% stenosis. A hypodense lesion extends superiorly from the level of the stenosis likely reflecting mural adherent  thrombus. 3. Approximally 20-30% stenosis of the proximal left internal carotid artery. Electronically Signed   By: Valetta Mole M.D.   On: 03/08/2021 14:30   MR BRAIN WO CONTRAST  Result Date: 03/09/2021 CLINICAL DATA:  Stroke, follow up EXAM: MRI HEAD WITHOUT CONTRAST TECHNIQUE: Multiplanar, multiecho pulse sequences of the brain and surrounding structures were obtained without intravenous contrast. COMPARISON:  CT exams from yesterday. FINDINGS: Brain: Multifocal acute infarcts in the right occipital lobe, confluent in areas. Additional scattered smaller acute infarcts in the right frontal, parietal and posterior temporal lobes. Associated edema without mass effect. No evidence of acute hemorrhage, hydrocephalus, mass lesion, midline shift, or extra-axial fluid collection. Additional scattered moderate T2 hyperintensities within the white matter, nonspecific but most likely related to coronary artery disease given the patient's risk factors. 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 Vascular: Major arterial flow voids are maintained at the skull base. Vasculature further evaluated on recent CTA. Skull and upper cervical spine: Normal marrow signal. Sinuses/Orbits: Largely clear visualized sinuses. No acute orbital findings. Other: No sizable mastoid effusions. IMPRESSION: 1. Multifocal acute infarcts in the right occipital lobe. Additional scattered smaller acute infarcts in the right frontal, parietal and posterior temporal lobes. Associated edema without mass effect. 2. Age advanced, moderate chronic microvascular ischemic disease. Electronically Signed   By: Margaretha Sheffield M.D.   On: 03/09/2021 09:12   ECHOCARDIOGRAM COMPLETE  Result Date: 03/09/2021    ECHOCARDIOGRAM REPORT   Patient Name:   [name] [name] Date of Exam: 03/09/2021 Medical Rec #:  [id number]     Height:       72.0 in Accession #:    [id number]    Weight:        227.0 lb Date of Birth:  1961-04-15     BSA:          2.248 m Patient Age:    28 years      BP:           161/94 mmHg Patient Gender: M             HR:           67 bpm. Exam Location:  Inpatient Procedure: 2D Echo, Cardiac Doppler and Color Doppler Indications:    CVA  History:        Patient has prior history of Echocardiogram examinations, most                 recent 10/02/2016. Arrythmias:Atrial Fibrillation; Risk                 Factors:Hypertension.  Sonographer:    MH Referring Phys: [id number] B[place]y [place]ne  1. Left ventricular ejection fraction, by estimation, is 50%. The left ventricle has low normal function. Left ventricular endocardial border not optimally defined to evaluate regional wall motion. The left ventricular internal cavity size was moderately dilated. Left ventricular diastolic parameters are consistent with Grade I diastolic dysfunction (impaired relaxation).  2. Right ventricular systolic function is normal. The right ventricular size is mildly enlarged. There is normal pulmonary artery systolic pressure.  3. Left atrial size was moderately dilated.  4. The mitral valve is normal in structure. Mild mitral valve regurgitation.  No evidence of mitral stenosis.  5. The aortic valve is tricuspid. Aortic valve regurgitation is not visualized. Mild to moderate aortic valve sclerosis/calcification is present, without any evidence of aortic stenosis. Aortic valve area, by VTI measures 2.20 cm. Aortic valve mean gradient measures 4.0 mmHg. Aortic valve Vmax measures 1.29 m/s.  6. The inferior vena cava is normal in size with greater than 50% respiratory variability, suggesting right atrial pressure of 3 mmHg.  7. Recommend limited echo with definity to assess LV Apex. Conclusion(s)/Recommendation(s): No intracardiac source of embolism detected on this transthoracic study. A transesophageal echocardiogram is recommended to exclude cardiac source of embolism if clinically indicated. FINDINGS   Left Ventricle: Left ventricular ejection fraction, by estimation, is 50%. The left ventricle has low normal function. The left ventricle has no regional wall motion abnormalities. The left ventricular internal cavity size was moderately dilated. There is no left ventricular hypertrophy. Left ventricular diastolic parameters are consistent with Grade I diastolic dysfunction (impaired relaxation). Right Ventricle: The right ventricular size is mildly enlarged. No increase in right ventricular wall thickness. Right ventricular systolic function is normal. There is normal pulmonary artery systolic pressure. The tricuspid regurgitant velocity is 2.24  m/s, and with an assumed right atrial pressure of 3 mmHg, the estimated right ventricular systolic pressure is [id number] mmHg. Left Atrium: Left atrial size was moderately dilated. Right Atrium: Right atrial size was normal in size. Pericardium: There is no evidence of pericardial effusion. Mitral Valve: The mitral valve is normal in structure. Mild mitral valve regurgitation. No evidence of mitral valve stenosis. Tricuspid Valve: The tricuspid valve is normal in structure. Tricuspid valve regurgitation is trivial. No evidence of tricuspid stenosis. Aortic Valve: The aortic valve is tricuspid. Aortic valve regurgitation is not visualized. Mild to moderate aortic valve sclerosis/calcification is present, without any evidence of aortic stenosis. Aortic valve mean gradient measures 4.0 mmHg. Aortic valve peak gradient measures 6.7 mmHg. Aortic valve area, by VTI measures 2.20 cm. Pulmonic Valve: The pulmonic valve was normal in structure. Pulmonic valve regurgitation is trivial. No evidence of pulmonic stenosis. Aorta: The aortic root is normal in size and structure. Venous: The inferior vena cava is normal in size with greater than 50% respiratory variability, suggesting right atrial pressure of 3 mmHg. IAS/Shunts: No atrial level shunt detected by color flow Doppler.  LEFT  VENTRICLE PLAX 2D LVIDd:         6.40 cm      Diastology LVIDs:         5.10 cm      LV e' medial:  5.77 cm/s LV PW:         0.90 cm      LV e' lateral: 8.70 cm/s LV IVS:        1.10 cm LVOT diam:     2.20 cm LV SV:         73 LV SV Index:   32 LVOT Area:     3.80 cm  LV Volumes (MOD) LV vol d, MOD A4C: 170.0 ml LV vol s, MOD A4C: 82.3 ml LV SV MOD A4C:     170.0 ml RIGHT VENTRICLE            IVC RV S prime:     8.70 cm/s  IVC diam: 1.60 cm TAPSE (M-mode): 2.2 cm LEFT ATRIUM             Index       RIGHT ATRIUM  Index LA diam:        4.10 cm 1.82 cm/m  RA Area:     19.00 cm LA Vol (A2C):   94.8 ml 42.16 ml/m RA Volume:   55.60 ml  24.73 ml/m LA Vol (A4C):   91.5 ml 40.70 ml/m LA Biplane Vol: 99.7 ml 44.34 ml/m  AORTIC VALVE                   PULMONIC VALVE AV Area (Vmax):    2.56 cm    PV Vmax:       0.65 m/s AV Area (Vmean):   2.51 cm    PV Peak grad:  1.7 mmHg AV Area (VTI):     2.20 cm AV Vmax:           129.00 cm/s AV Vmean:          91.300 cm/s AV VTI:            0.330 m AV Peak Grad:      6.7 mmHg AV Mean Grad:      4.0 mmHg LVOT Vmax:         86.80 cm/s LVOT Vmean:        60.400 cm/s LVOT VTI:          0.191 m LVOT/AV VTI ratio: 0.58  AORTA Ao Root diam: 3.30 cm Ao Asc diam:  3.10 cm MITRAL VALVE            TRICUSPID VALVE MV Area (PHT): 4.89 cm TR Peak grad:   20.1 mmHg                         TR Vmax:        224.00 cm/s                          SHUNTS                         Systemic VTI:  0.19 m                         Systemic Diam: 2.20 cm Fransico Him MD Electronically signed by Fransico Him MD Signature Date/Time: 03/09/2021/10:27:27 AM    Final    VAS US CAROTID  Result Date: 03/09/2021 Carotid Arterial Duplex Study Patient Name:  [name] [name]  Date of Exam:   03/09/2021 Medical Rec #: [id number]      Accession #:    [id number] Date of Birth: 1961-04-06      Patient Gender: M Patient Age:   34 years Exam Location:  [facility] Procedure:      VAS US CAROTID Referring Phys:  Joycelyn Schmid PRAY --------------------------------------------------------------------------------  Indications:       CVA. Risk Factors:      Hypertension, hyperlipidemia, current smoker, prior MI,                    coronary artery disease. Other Factors:     Left carotid endarterectomy in 2017. Comparison Study:  03-08-2021 CTA neck showed right ICA stenosis.                     11-20-2016 Carotid duplex showed <40% RT ICA stenosis and                    patent LT ICA  s/p endarterectomy. Performing Technologist: Darlin Coco RDMS, RVT  Examination Guidelines: A complete evaluation includes B-mode imaging, spectral Doppler, color Doppler, and power Doppler as needed of all accessible portions of each vessel. Bilateral testing is considered an integral part of a complete examination. Limited examinations for reoccurring indications may be performed as noted.  Right Carotid Findings: +----------+--------+--------+--------+---------------------------+--------+           PSV cm/sEDV cm/sStenosisPlaque Description         Comments +----------+--------+--------+--------+---------------------------+--------+ CCA Prox  58      14                                                  +----------+--------+--------+--------+---------------------------+--------+ CCA Distal66      19                                                  +----------+--------+--------+--------+---------------------------+--------+ ICA Prox  656     302     80-99%  heterogenous and hypoechoic         +----------+--------+--------+--------+---------------------------+--------+ ICA Mid   390     176                                                 +----------+--------+--------+--------+---------------------------+--------+ ICA Distal42      21                                                  +----------+--------+--------+--------+---------------------------+--------+ ECA       147     20              heterogenous                         +----------+--------+--------+--------+---------------------------+--------+ +----------+--------+-------+----------------+-------------------+           PSV cm/sEDV cmsDescribe        Arm Pressure (mmHG) +----------+--------+-------+----------------+-------------------+ [id number]            Multiphasic, WNL                    +----------+--------+-------+----------------+-------------------+ +---------+--------+--+--------+--+---------+ VertebralPSV cm/s61EDV cm/s12Antegrade +---------+--------+--+--------+--+---------+  Left Carotid Findings: +----------+--------+--------+--------+------------------+--------+           PSV cm/sEDV cm/sStenosisPlaque DescriptionComments +----------+--------+--------+--------+------------------+--------+ CCA Prox  98      28                                         +----------+--------+--------+--------+------------------+--------+ CCA Distal85      24                                         +----------+--------+--------+--------+------------------+--------+ ICA Prox  94      37      1-39%   mild  heterogenous          +----------+--------+--------+--------+------------------+--------+ ICA Mid   95      42                                         +----------+--------+--------+--------+------------------+--------+ ICA Distal89      39                                         +----------+--------+--------+--------+------------------+--------+ ECA       91      15                                         +----------+--------+--------+--------+------------------+--------+ +----------+--------+--------+----------------+-------------------+           PSV cm/sEDV cm/sDescribe        Arm Pressure (mmHG) +----------+--------+--------+----------------+-------------------+ Subclavian171             Multiphasic, WNL                     +----------+--------+--------+----------------+-------------------+ +---------+--------+--+--------+--+---------+ VertebralPSV cm/s50EDV cm/s15Antegrade +---------+--------+--+--------+--+---------+   Summary: Right Carotid: Velocities in the right ICA are consistent with a 80-99%                stenosis. Left Carotid: Velocities in the left ICA are consistent with a 1-39% stenosis. Vertebrals:  Bilateral vertebral arteries demonstrate antegrade flow. Subclavians: Normal flow hemodynamics were seen in bilateral subclavian              arteries. *See table(s) above for measurements and observations.  Electronically signed by Monica Martinez MD on 03/09/2021 at 2:31:20 PM.    Final     Results/Tests Pending at Time of Discharge: none  Discharge Medications:  Allergies as of 03/09/2021   No Known Allergies      Medication List     TAKE these medications    aspirin 325 MG EC tablet Take 1 tablet (325 mg total) by mouth daily. Start taking on: March 10, 2021 What changed:  medication strength how much to take   atorvastatin 80 MG tablet Commonly known as: LIPITOR TAKE 1 TABLET BY MOUTH DAILY.   carvedilol 6.25 MG tablet Commonly known as: COREG TAKE 1 TABLET BY MOUTH 2 TIMES DAILY WITH A MEAL.   clopidogrel 75 MG tablet Commonly known as: PLAVIX Take 1 tablet (75 mg total) by mouth daily. Start taking on: March 10, 2021   dapagliflozin propanediol 10 MG Tabs tablet Commonly known as: FARXIGA Take 1 tablet (10 mg total) by mouth daily. Start taking on: March 10, 2021   furosemide 20 MG tablet Commonly known as: LASIX Take 1 tablet by mouth every morning for the next 3 days then 1 every morning as needed edema What changed:  how much to take how to take this when to take this reasons to take this additional instructions   gabapentin 300 MG capsule Commonly known as: NEURONTIN One 2 times a day late afternoon and evening What changed:  how much to take how  to take this when to take this reasons to take this additional instructions   lisinopril 10 MG tablet Commonly known as: ZESTRIL TAKE 1 TABLET BY MOUTH AT BEDTIME.  spironolactone 25 MG tablet Commonly known as: ALDACTONE TAKE 1/2 TABLET BY MOUTH DAILY What changed:  how much to take when to take this        Discharge Instructions: Please refer to Patient Instructions section of EMR for full details.  Patient was counseled important signs and symptoms that should prompt return to medical care, changes in medications, dietary instructions, activity restrictions, and follow up appointments.   Follow-Up Appointments:  Follow-up Information     [facility] Follow up.   Specialty: Rehabilitation Why: The outpatient rehab will contact you for the first appointment Contact information: [place] [id number] [place]        Kathyrn Drown, MD Follow up.   Specialty: Family Medicine Contact information: [place] [address] [phone]         Serafina Mitchell, MD Follow up.   Specialties: Vascular Surgery, Cardiology Contact information: [place] [address] [phone]                 Gladys Damme, MD 03/09/2021, 4:35 PM PGY-3, [place]

## 2021-03-09 NOTE — Progress Notes (Addendum)
STROKE TEAM PROGRESS NOTE   ATTENDING NOTE: I reviewed above note and agree with the assessment and plan. Pt was seen and examined.   60 year old male with history of CAD status post CABG, status post LAA clipping, CHF, lone A. fib, HLD, left CEA 2017, smoker admitted for left-sided facial numbness, left arm weakness and headache.  CT head right temporal occipital infarct.  CTA head and neck right P1/P2 severe stenosis, right ICA 70 to 80% stenosis.  Radiology also reported possible mural thrombus just distal to the right ICA stenosis, however, on my review, it could be slow flow just beyond right ICA stenosis.  MRI showed right MCA scattered infarcts, biggest at right temporal occipital region.  Carotid Doppler right ICA 80 to 99% stenosis.  EF 50%.  LA moderately dilated.  A1c 5.8, LDL 63, creatinine 0.9.  On exam, patient awake alert, orientated x3.  No aphasia, follows simple commands, able to name and repeat.  No gaze palsy, however seems to have intermittent left lower quadrant simultanagnosia.  Facial symmetrical, tongue midline.  Moving all extremities symmetrically, no motor deficit.  Sensation symmetrical, finger-to-nose intact bilaterally.  Etiology for patient stroke at this time likely due to right ICA high-grade stenosis.  Patient also has right PCA severe stenosis but likely chronic and not symptomatic.  Had vascular surgery consultation with Dr. Trula Slade, who recommend right ICA on Friday.  Discussed with patient to stay until Friday however, he would like to go home and come back Friday for outpatient procedure.  Patient is aware of small risk of stroke during the interval time while outside hospital.  We recommend continue aspirin 325, Plavix 75 and Lipitor 80 for stroke management.  Avoid low BP.  Quit smoking, stroke risk factor modification.  We will follow-up with neurology stroke clinic at [facility] in 4 weeks.  For detailed assessment and plan, please refer to below as I have made changes  wherever appropriate.   Neurology will sign off. Please call with questions. Pt will follow up with stroke clinic NP at [facility] in about 4 weeks. Thanks for the consult.   Rosalin Hawking, MD PhD Stroke Neurology 03/09/2021 5:30 PM    INTERVAL HISTORY Feels well this AM. No complaints aside from wanting to go home. Denies new focal neurological deficits or symptoms.   Vitals:   03/09/21 0201 03/09/21 0359 03/09/21 0735 03/09/21 1119  BP: (!) 172/84 (!) 171/95 (!) 173/98 (!) 188/112  Pulse: 66 72 76 70  Resp: '17 16 18 18  '$ Temp: (!) 97.4 F (36.3 C) 98.1 F (36.7 C) 97.7 F (36.5 C) 98 F (36.7 C)  TempSrc: Oral Oral Oral Oral  SpO2: 100% 98% 97% 99%   CBC:  Recent Labs  Lab 03/08/21 1326 03/08/21 1340  WBC 7.4  --   NEUTROABS 2.8  --   HGB 15.4 15.3  HCT 44.9 45.0  MCV 92.6  --   PLT 243  --    Basic Metabolic Panel:  Recent Labs  Lab 03/08/21 1326 03/08/21 1340 03/09/21 0801  NA 136 140 138  K 4.6 4.5 4.4  CL 105 103 104  CO2 26  --  29  GLUCOSE 104* 100* 142*  BUN '11 11 8  '$ CREATININE 0.98 0.90 0.92  CALCIUM 9.0  --  9.1   Lipid Panel:  Recent Labs  Lab 03/09/21 0250  CHOL 114  TRIG 125  HDL 26*  CHOLHDL 4.4  VLDL 25  LDLCALC 63   HgbA1c:  Recent Labs  Lab 03/09/21 0250  HGBA1C 5.8*   Urine Drug Screen:  Recent Labs  Lab 03/08/21 1830  LABOPIA NONE DETECTED  COCAINSCRNUR NONE DETECTED  LABBENZ NONE DETECTED  AMPHETMU NONE DETECTED  THCU NONE DETECTED  LABBARB NONE DETECTED    Alcohol Level  Recent Labs  Lab 03/08/21 1326  ETH <10    IMAGING past 24 hours CT Angio Head W or Wo Contrast  Result Date: 03/08/2021 CLINICAL DATA:  Neuro deficit, stroke suspected EXAM: CT ANGIOGRAPHY HEAD AND NECK TECHNIQUE: Multidetector CT imaging of the head and neck was performed using the standard protocol during bolus administration of intravenous contrast. Multiplanar CT image reconstructions and MIPs were obtained to evaluate the vascular anatomy.  Carotid stenosis measurements (when applicable) are obtained utilizing NASCET criteria, using the distal internal carotid diameter as the denominator. CONTRAST:  52m OMNIPAQUE IOHEXOL 350 MG/ML SOLN COMPARISON:  Same-day noncontrast CT head FINDINGS: CTA NECK FINDINGS Aortic arch: There is mild calcified atherosclerotic plaque of the aortic arch. The branching pattern is normal. Postsurgical changes reflecting CABG are noted. Right carotid system: There is soft and calcified atherosclerotic plaque of the proximal right internal carotid artery resulting in proximally 70-80% stenosis. A hypodense lesion extends superiorly from the level of the stenosis likely reflecting mural adherent thrombus (7-216, 8-113). The distal internal carotid artery is patent. The external carotid artery is patent. There is no dissection or aneurysm. Left carotid system: There is predominantly soft atherosclerotic plaque of the proximal left internal carotid artery resulting in up to 20-30% stenosis. There is no dissection or aneurysm. Vertebral arteries: There is mild calcified atherosclerotic plaque at the origin of the right vertebral artery. The vertebral arteries are otherwise patent bilaterally. There is no hemodynamically significant stenosis or occlusion. There is no dissection or aneurysm. Skeleton: There is degenerative change of the cervical spine most advanced at C5-C6 and C6-C7. There is no acute osseous abnormality or aggressive osseous lesion. Other neck: A right palatine tonsillith is noted. Upper chest: There is scarring in the lung apices. Review of the MIP images confirms the above findings CTA HEAD FINDINGS Anterior circulation: There is calcification of the bilateral cavernous ICAs resulting in up to mild stenosis on the left. There is no high-grade stenosis or occlusion. The bilateral MCAs and ACAs are patent. There is no aneurysm. Posterior circulation: The V4 segments of the vertebral arteries are patent with  minimal calcified atherosclerotic plaque on the right. The basilar artery is patent. There is focal severe stenosis of the right PCA at the P1/P2 junction (7-100) with diminutive enhancement throughout the remainder of the right PCA. The left PCA is patent. Venous sinuses: As permitted by contrast timing, patent. Anatomic variants: None. Review of the MIP images confirms the above findings IMPRESSION: 1. Focal severe stenosis at the right P1/P2 junction with diminutive enhancement of the right PCA distal to this point. 2. Atherosclerotic plaque at the proximal right internal carotid artery resulting in 70-80% stenosis. A hypodense lesion extends superiorly from the level of the stenosis likely reflecting mural adherent thrombus. 3. Approximally 20-30% stenosis of the proximal left internal carotid artery. Electronically Signed   By: PValetta MoleM.D.   On: 03/08/2021 14:30   CT Angio Neck W and/or Wo Contrast  Result Date: 03/08/2021 CLINICAL DATA:  Neuro deficit, stroke suspected EXAM: CT ANGIOGRAPHY HEAD AND NECK TECHNIQUE: Multidetector CT imaging of the head and neck was performed using the standard protocol during bolus administration of intravenous contrast. Multiplanar CT image reconstructions and  MIPs were obtained to evaluate the vascular anatomy. Carotid stenosis measurements (when applicable) are obtained utilizing NASCET criteria, using the distal internal carotid diameter as the denominator. CONTRAST:  84m OMNIPAQUE IOHEXOL 350 MG/ML SOLN COMPARISON:  Same-day noncontrast CT head FINDINGS: CTA NECK FINDINGS Aortic arch: There is mild calcified atherosclerotic plaque of the aortic arch. The branching pattern is normal. Postsurgical changes reflecting CABG are noted. Right carotid system: There is soft and calcified atherosclerotic plaque of the proximal right internal carotid artery resulting in proximally 70-80% stenosis. A hypodense lesion extends superiorly from the level of the stenosis likely  reflecting mural adherent thrombus (7-216, 8-113). The distal internal carotid artery is patent. The external carotid artery is patent. There is no dissection or aneurysm. Left carotid system: There is predominantly soft atherosclerotic plaque of the proximal left internal carotid artery resulting in up to 20-30% stenosis. There is no dissection or aneurysm. Vertebral arteries: There is mild calcified atherosclerotic plaque at the origin of the right vertebral artery. The vertebral arteries are otherwise patent bilaterally. There is no hemodynamically significant stenosis or occlusion. There is no dissection or aneurysm. Skeleton: There is degenerative change of the cervical spine most advanced at C5-C6 and C6-C7. There is no acute osseous abnormality or aggressive osseous lesion. Other neck: A right palatine tonsillith is noted. Upper chest: There is scarring in the lung apices. Review of the MIP images confirms the above findings CTA HEAD FINDINGS Anterior circulation: There is calcification of the bilateral cavernous ICAs resulting in up to mild stenosis on the left. There is no high-grade stenosis or occlusion. The bilateral MCAs and ACAs are patent. There is no aneurysm. Posterior circulation: The V4 segments of the vertebral arteries are patent with minimal calcified atherosclerotic plaque on the right. The basilar artery is patent. There is focal severe stenosis of the right PCA at the P1/P2 junction (7-100) with diminutive enhancement throughout the remainder of the right PCA. The left PCA is patent. Venous sinuses: As permitted by contrast timing, patent. Anatomic variants: None. Review of the MIP images confirms the above findings IMPRESSION: 1. Focal severe stenosis at the right P1/P2 junction with diminutive enhancement of the right PCA distal to this point. 2. Atherosclerotic plaque at the proximal right internal carotid artery resulting in 70-80% stenosis. A hypodense lesion extends superiorly from the  level of the stenosis likely reflecting mural adherent thrombus. 3. Approximally 20-30% stenosis of the proximal left internal carotid artery. Electronically Signed   By: PValetta MoleM.D.   On: 03/08/2021 14:30   MR BRAIN WO CONTRAST  Result Date: 03/09/2021 CLINICAL DATA:  Stroke, follow up EXAM: MRI HEAD WITHOUT CONTRAST TECHNIQUE: Multiplanar, multiecho pulse sequences of the brain and surrounding structures were obtained without intravenous contrast. COMPARISON:  CT exams from yesterday. FINDINGS: Brain: Multifocal acute infarcts in the right occipital lobe, confluent in areas. Additional scattered smaller acute infarcts in the right frontal, parietal and posterior temporal lobes. Associated edema without mass effect. No evidence of acute hemorrhage, hydrocephalus, mass lesion, midline shift, or extra-axial fluid collection. Additional scattered moderate T2 hyperintensities within the white matter, nonspecific but most likely related to coronary artery disease given the patient's risk factors. 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 Vascular: Major arterial flow voids are maintained at the skull base. Vasculature further evaluated on recent CTA. Skull and upper cervical spine: Normal marrow signal. Sinuses/Orbits: Largely clear visualized sinuses. No acute orbital findings. Other: No sizable mastoid  effusions. IMPRESSION: 1. Multifocal acute infarcts in the right occipital lobe. Additional scattered smaller acute infarcts in the right frontal, parietal and posterior temporal lobes. Associated edema without mass effect. 2. Age advanced, moderate chronic microvascular ischemic disease. Electronically Signed   By: Margaretha Sheffield M.D.   On: 03/09/2021 09:12   ECHOCARDIOGRAM COMPLETE  Result Date: 03/09/2021    ECHOCARDIOGRAM REPORT   Patient Name:   BRIGGS ALCIDE Date of Exam: 03/09/2021 Medical Rec #:  [id number]     Height:        72.0 in Accession #:    [id number]    Weight:       227.0 lb Date of Birth:  April 11, 1961     BSA:          2.248 m Patient Age:    36 years      BP:           161/94 mmHg Patient Gender: M             HR:           67 bpm. Exam Location:  Inpatient Procedure: 2D Echo, Cardiac Doppler and Color Doppler Indications:    CVA  History:        Patient has prior history of Echocardiogram examinations, most                 recent 10/02/2016. Arrythmias:Atrial Fibrillation; Risk                 Factors:Hypertension.  Sonographer:    MH Referring Phys: [id number] [place]  1. Left ventricular ejection fraction, by estimation, is 50%. The left ventricle has low normal function. Left ventricular endocardial border not optimally defined to evaluate regional wall motion. The left ventricular internal cavity size was moderately dilated. Left ventricular diastolic parameters are consistent with Grade I diastolic dysfunction (impaired relaxation).  2. Right ventricular systolic function is normal. The right ventricular size is mildly enlarged. There is normal pulmonary artery systolic pressure.  3. Left atrial size was moderately dilated.  4. The mitral valve is normal in structure. Mild mitral valve regurgitation. No evidence of mitral stenosis.  5. The aortic valve is tricuspid. Aortic valve regurgitation is not visualized. Mild to moderate aortic valve sclerosis/calcification is present, without any evidence of aortic stenosis. Aortic valve area, by VTI measures 2.20 cm. Aortic valve mean gradient measures 4.0 mmHg. Aortic valve Vmax measures 1.29 m/s.  6. The inferior vena cava is normal in size with greater than 50% respiratory variability, suggesting right atrial pressure of 3 mmHg.  7. Recommend limited echo with definity to assess LV Apex. Conclusion(s)/Recommendation(s): No intracardiac source of embolism detected on this transthoracic study. A transesophageal echocardiogram is recommended to exclude cardiac  source of embolism if clinically indicated. FINDINGS  Left Ventricle: Left ventricular ejection fraction, by estimation, is 50%. The left ventricle has low normal function. The left ventricle has no regional wall motion abnormalities. The left ventricular internal cavity size was moderately dilated. There is no left ventricular hypertrophy. Left ventricular diastolic parameters are consistent with Grade I diastolic dysfunction (impaired relaxation). Right Ventricle: The right ventricular size is mildly enlarged. No increase in right ventricular wall thickness. Right ventricular systolic function is normal. There is normal pulmonary artery systolic pressure. The tricuspid regurgitant velocity is 2.24  m/s, and with an assumed right atrial pressure of 3 mmHg, the estimated right ventricular systolic pressure is [id number] mmHg. Left Atrium: Left atrial size was moderately dilated.  Right Atrium: Right atrial size was normal in size. Pericardium: There is no evidence of pericardial effusion. Mitral Valve: The mitral valve is normal in structure. Mild mitral valve regurgitation. No evidence of mitral valve stenosis. Tricuspid Valve: The tricuspid valve is normal in structure. Tricuspid valve regurgitation is trivial. No evidence of tricuspid stenosis. Aortic Valve: The aortic valve is tricuspid. Aortic valve regurgitation is not visualized. Mild to moderate aortic valve sclerosis/calcification is present, without any evidence of aortic stenosis. Aortic valve mean gradient measures 4.0 mmHg. Aortic valve peak gradient measures 6.7 mmHg. Aortic valve area, by VTI measures 2.20 cm. Pulmonic Valve: The pulmonic valve was normal in structure. Pulmonic valve regurgitation is trivial. No evidence of pulmonic stenosis. Aorta: The aortic root is normal in size and structure. Venous: The inferior vena cava is normal in size with greater than 50% respiratory variability, suggesting right atrial pressure of 3 mmHg. IAS/Shunts: No atrial  level shunt detected by color flow Doppler.  LEFT VENTRICLE PLAX 2D LVIDd:         6.40 cm      Diastology LVIDs:         5.10 cm      LV e' medial:  5.77 cm/s LV PW:         0.90 cm      LV e' lateral: 8.70 cm/s LV IVS:        1.10 cm LVOT diam:     2.20 cm LV SV:         73 LV SV Index:   32 LVOT Area:     3.80 cm  LV Volumes (MOD) LV vol d, MOD A4C: 170.0 ml LV vol s, MOD A4C: 82.3 ml LV SV MOD A4C:     170.0 ml RIGHT VENTRICLE            IVC RV S prime:     8.70 cm/s  IVC diam: 1.60 cm TAPSE (M-mode): 2.2 cm LEFT ATRIUM             Index       RIGHT ATRIUM           Index LA diam:        4.10 cm 1.82 cm/m  RA Area:     19.00 cm LA Vol (A2C):   94.8 ml 42.16 ml/m RA Volume:   55.60 ml  24.73 ml/m LA Vol (A4C):   91.5 ml 40.70 ml/m LA Biplane Vol: 99.7 ml 44.34 ml/m  AORTIC VALVE                   PULMONIC VALVE AV Area (Vmax):    2.56 cm    PV Vmax:       0.65 m/s AV Area (Vmean):   2.51 cm    PV Peak grad:  1.7 mmHg AV Area (VTI):     2.20 cm AV Vmax:           129.00 cm/s AV Vmean:          91.300 cm/s AV VTI:            0.330 m AV Peak Grad:      6.7 mmHg AV Mean Grad:      4.0 mmHg LVOT Vmax:         86.80 cm/s LVOT Vmean:        60.400 cm/s LVOT VTI:          0.191 m LVOT/AV VTI ratio: 0.58  AORTA Ao  Root diam: 3.30 cm Ao Asc diam:  3.10 cm MITRAL VALVE            TRICUSPID VALVE MV Area (PHT): 4.89 cm TR Peak grad:   20.1 mmHg                         TR Vmax:        224.00 cm/s                          SHUNTS                         Systemic VTI:  0.19 m                         Systemic Diam: 2.20 cm Fransico Him MD Electronically signed by Fransico Him MD Signature Date/Time: 03/09/2021/10:27:27 AM    Final    VAS US CAROTID  Result Date: 03/09/2021 Carotid Arterial Duplex Study Patient Name:  KIERNAN BARTLETTE  Date of Exam:   03/09/2021 Medical Rec #: [id number]      Accession #:    [id number] Date of Birth: 02/19/1961      Patient Gender: M Patient Age:   74 years Exam [facility]  [facility]  Procedure:      VAS US CAROTID Referring Phys: Joycelyn Schmid PRAY --------------------------------------------------------------------------------  Indications:       CVA. Risk Factors:      Hypertension, hyperlipidemia, current smoker, prior MI,                    coronary artery disease. Other Factors:     Left carotid endarterectomy in 2017. Comparison Study:  03-08-2021 CTA neck showed right ICA stenosis.                     11-20-2016 Carotid duplex showed <40% RT ICA stenosis and                    patent LT ICA s/p endarterectomy. Performing Technologist: Darlin Coco RDMS, RVT  Examination Guidelines: A complete evaluation includes B-mode imaging, spectral Doppler, color Doppler, and power Doppler as needed of all accessible portions of each vessel. Bilateral testing is considered an integral part of a complete examination. Limited examinations for reoccurring indications may be performed as noted.  Right Carotid Findings: +----------+--------+--------+--------+---------------------------+--------+           PSV cm/sEDV cm/sStenosisPlaque Description         Comments +----------+--------+--------+--------+---------------------------+--------+ CCA Prox  58      14                                                  +----------+--------+--------+--------+---------------------------+--------+ CCA Distal66      19                                                  +----------+--------+--------+--------+---------------------------+--------+ ICA Prox  656     302     80-99%  heterogenous and hypoechoic         +----------+--------+--------+--------+---------------------------+--------+ ICA Mid  390     176                                                 +----------+--------+--------+--------+---------------------------+--------+ ICA Distal42      21                                                  +----------+--------+--------+--------+---------------------------+--------+ ECA        147     20              heterogenous                        +----------+--------+--------+--------+---------------------------+--------+ +----------+--------+-------+----------------+-------------------+           PSV cm/sEDV cmsDescribe        Arm Pressure (mmHG) +----------+--------+-------+----------------+-------------------+ [id number]            Multiphasic, WNL                    +----------+--------+-------+----------------+-------------------+ +---------+--------+--+--------+--+---------+ VertebralPSV cm/s61EDV cm/s12Antegrade +---------+--------+--+--------+--+---------+  Left Carotid Findings: +----------+--------+--------+--------+------------------+--------+           PSV cm/sEDV cm/sStenosisPlaque DescriptionComments +----------+--------+--------+--------+------------------+--------+ CCA Prox  98      28                                         +----------+--------+--------+--------+------------------+--------+ CCA Distal85      24                                         +----------+--------+--------+--------+------------------+--------+ ICA Prox  94      37      1-39%   mild heterogenous          +----------+--------+--------+--------+------------------+--------+ ICA Mid   95      42                                         +----------+--------+--------+--------+------------------+--------+ ICA Distal89      39                                         +----------+--------+--------+--------+------------------+--------+ ECA       91      15                                         +----------+--------+--------+--------+------------------+--------+ +----------+--------+--------+----------------+-------------------+           PSV cm/sEDV cm/sDescribe        Arm Pressure (mmHG) +----------+--------+--------+----------------+-------------------+ Subclavian171             Multiphasic, WNL                     +----------+--------+--------+----------------+-------------------+ +---------+--------+--+--------+--+---------+ VertebralPSV cm/s50EDV cm/s15Antegrade +---------+--------+--+--------+--+---------+  Summary: Right Carotid: Velocities in the right ICA are consistent with a 80-99%                stenosis. Left Carotid: Velocities in the left ICA are consistent with a 1-39% stenosis. Vertebrals:  Bilateral vertebral arteries demonstrate antegrade flow. Subclavians: Normal flow hemodynamics were seen in bilateral subclavian              arteries. *See table(s) above for measurements and observations.     Preliminary     PHYSICAL EXAM HEENT: [place]/AT Lungs: Respirations unlabored Ext: No edema   Neurologic Examination: Mental Status: Alert, oriented, thought content appropriate.  Speech fluent without evidence of aphasia.  Able to follow all commands without difficulty. Cranial Nerves: II:  Homonymous left inferior quadrantanopsia documented H and P. Only present OS nasal on 9/7. PERRL.  III,IV, VI: No ptosis. EOMI. No nystagmus.  V,VII: Smile symmetric, facial sensation equal bilaterally VIII: hearing intact to voice IX,X: Palate rises symmetrically XI: Symmetric shoulder shrug XII: Midline tongue extension  Motor: Right :  Upper extremity   5/5                                      Left:     Upper extremity   5/5             Lower extremity   5/5                                                  Lower extremity   5/5 Normal tone throughout; no atrophy noted. Bradykinetic finger taps on left, slight  Sensory: Temp and light touch intact throughout, bilaterally Deep Tendon Reflexes:  2+ bilateral biceps, brachioradialis and patellae Cerebellar: No ataxia with FNF bilaterally  Gait: Deferred  ASSESSMENT/PLAN [name] [name] is a 60 y.o. left handed male with a PMHx of CAD s/p CABG with clipping of LA appendage, ischemic cardiomyopathy with baseline LVEF of 35-40%, CHF, one prior isolated  episode of atrial fibrillation,  hypercholesterolemia, carotid stenosis s/p left CEA in 2017 and tobacco abuse who presents to the ED 9/6 for evaluation of left arm tingling, left face numbness, and trouble writing.  #acute right MCA scattered stroke, likely symptomatic right carotid high grade stenosis: Found to have multifocal acute infarcts right occipital lobe, right frontal lobe, right parietal lobe. Upon image review, spares PCA territory and has right carotid stenosis 80-99% on preliminary carotid ultrasound read. Likely Etiology symptomatic right carotid disease. On asa and statin at home. Changed plan to DAPT w/ addition of clopidogrel, likely for 90 days but to be determined. Increased aspirin from 81 to 325. Continue home atorvastatin '80mg'$ .   Code Stroke CT head  No acute abnormality. Small vessel disease. Atrophy.  CTA Head and Neck 1. Focal severe stenosis at the right P1/P2 junction with diminutive enhancement of the right PCA distal to this point. 2. Atherosclerotic plaque at the proximal right internal carotid artery resulting in 70-80% stenosis. A hypodense lesion extends superiorly from the level of the stenosis likely reflecting mural adherent thrombus. 3. Approximally 20-30% stenosis of the proximal left internal carotid artery. MRI  1. Multifocal acute infarcts in the right occipital lobe. Additional scattered smaller acute infarcts in the right frontal, parietal and  posterior temporal lobes. Associated edema without mass effect. 2. Age advanced, moderate chronic microvascular ischemic disease. Carotid Doppler  80-99% stenosis of right ICA , preliminary read at time of documentation  2D Echo 50% ejection fraction, LV endocardial border not well visualized to evaluate regional wall motion abnormalities. Grade I diastolic dysfunction, mildly dilated left atrium , mild MV regurg,   LDL 63 HgbA1c 5.8 VTE prophylaxis - lovenox '40mg'$      Diet   Diet Heart Room service  appropriate? Yes; Fluid consistency: Thin   Therapy recommendations:  no PT needs ; visual perceptive task with outpatient OT, no SLP needs  Disposition: '[ ]'$   pending workup , vasc surgery eval for ?stenting right ICA   #Right Carotid Artery Stenosis  Atherosclerotic plaque at the proximal right internal carotid       artery resulting in 70-80% stenosis on CTA with likely more severe stenosis on CUS.  Vascular surgery consult pending   #history left ICA stenosis -s/p left CEA in 2017 without sequela   #Hypertension Home meds:  lisinopril '10mg'$   qhs, coreg 6.'25mg'$  BID, spironolactone 12.'5mg'$  daily, furosemide '20mg'$  prn for edema  Current meds inpatient: coreg. BP goal <180, would not treat aggressively in setting of acute ischemic and stenosis  Unstable  Permissive hypertension (OK if < 220/120) but gradually normalize in 5-7 days Long-term BP goal normotensive with caveat that may need to run on high side due to level of stenosis, would not currently treat unless >180   Hyperlipidemia Home meds:  atorvastatin '80mg'$  , continued here and will continue on d/c LDL 63, goal < 70  Other Stroke Risk Factors 35 year pack history Cigarette smoker, quit 5 years ago  Current ETOH use, alcohol level <10, advised to drink no more than 2 drink(s) a day. Obesity, There is no height or weight on file to calculate BMI., BMI >/= 30 associated with increased stroke risk, recommend weight loss, diet and exercise as appropriate  Coronary artery disease Congestive heart failure  Other Active Problems #Heart failure improved ejection fraction  #ischemic cardiomyopathy #NSTEMI  #CAD s/p CABG with clipping LA appendage  #DM -continue farxiga per primary team  #post-herpetic neuralgia -continue home gabapentin '300mg'$  qhs prn   -managed per primary team and as above   Hospital day # 0 This plan of care was directed by Dr.  Desanctis, PA-C   To contact Stroke Continuity provider, please refer to  [email or web address] After hours, contact General Neurology

## 2021-03-09 NOTE — H&P (View-Only) (Signed)
Hospital Consult    Reason for Consult:  symptomatic right ICA stenosis  Requesting Physician:  Chauncey Reading MRN #:  [id number]  History of Present Illness: This is a 60 y.o. male who presented to the hospital yesterday with stroke.  Pt states that on Saturday, he started having some numbness in his left forearm and fingers that lasted about 15 minutes and improved.  This did happen several times.  He states that he also had a terrible right sided headache, which did cause some blurry vision and this improved when the headache improved.  He states he also had some numbness around his ear, but this has also improved.  He states that he went back to work on Tuesday and was trying to write numbers and could not write them but he is not sure if this was due to the numbness in his fingers or visual changes.  He denies any other stroke sx.  His wife Angela Nevin used to work in our office.  She did call our office and given his symptoms, it was felt that he come to the Emergency room.  Workup reveals 70-80% right ICA stenosis on CTA and 80-99% stenosis of right ICA on duplex.  The left is at 1-39%.    Pt denies any chest pain, claudication, family hx of AAA.    He had quit smoking after his CABG but has since started back and is smoking about 1/2 ppd.  He states he has been compliant with his asa/statin.  States he just had an echo and his EF is up to 50% (was 20-25% at time of CABG).  Pt has hx of left CEA in 2017 by Dr. Trula Slade as well as CABG prior to that in 2017.   The pt is on a statin for cholesterol management.  The pt is on a daily aspirin.   Other AC:  Plavix/Lovenox The pt is on BB, ACEI, aldactone for hypertension.   The pt is diabetic.   Tobacco hx:  current (had quit in 2017)  Past Medical History:  Diagnosis Date   Acute systolic congestive heart failure (HCC)    Atrial fibrillation with rapid ventricular response ([place]) 12/13/2015   "had a heart monitor and can't find a source for the a. fib"    Cardiomyopathy ([place]) 12/13/2015   Cardiomyopathy, ischemic 12/14/2015   a. EF 20-25% in 12/2015 b. EF at 35-40% by repeat echo in 10/2016   Coronary artery disease involving native coronary artery with unstable angina pectoris ([place]) 12/15/2015   Hypercholesterolemia 12/15/2015   Mitral regurgitation 12/14/2015   moderate   NSTEMI (non-ST elevated myocardial infarction) (HCC)    PONV (postoperative nausea and vomiting)    "sick with very first surgery but nothing since" in 1990s   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 12/17/2015   LIMA to LAD, SVG to OM, SVG to PDA, EVH via right thigh with clipping of LA appendage   Tobacco abuse     Past Surgical History:  Procedure Laterality Date   CARDIAC CATHETERIZATION N/A 12/15/2015   Procedure: Right/Left Heart Cath and Coronary Angiography;  Surgeon: Wellington Hampshire, MD;  Location: [facility];  Service: Cardiovascular;  Laterality: N/A;   CLIPPING OF ATRIAL APPENDAGE Left 12/17/2015   Procedure: CLIPPING OF ATRIAL APPENDAGE;  Surgeon: [name], MD;  Location: [facility];  Service: Open Heart Surgery;  Laterality: Left;   COLONOSCOPY WITH PROPOFOL N/A 06/21/2016   Procedure: COLONOSCOPY WITH PROPOFOL;  Surgeon: Remo Lipps  Gaye Alken, MD;  Location: [facility];  Service: Gastroenterology;  Laterality: N/A;   CORONARY ARTERY BYPASS GRAFT N/A 12/17/2015   Procedure: CORONARY ARTERY BYPASS GRAFTING (CABG) x three , using left internal mammary artery and right leg greater saphenous vein;  Surgeon: [name], MD;  Location: [facility];  Service: Open Heart Surgery;  Laterality: N/A;   ENDARTERECTOMY Left 02/09/2016   Procedure: LEFT CAROTID ENDARTERECTOMY;  Surgeon: Serafina Mitchell, MD;  Location: [facility];  Service: Vascular;  Laterality: Left;   KNEE ARTHROSCOPY Right 1991   PATCH ANGIOPLASTY Left 02/09/2016   Procedure: PATCH ANGIOPLASTY USING Rueben Bash BIOLOGIC PATCH;  Surgeon: Serafina Mitchell, MD;  Location: [facility];  Service: Vascular;   Laterality: Left;    No Known Allergies  Prior to Admission medications   Medication Sig Start Date End Date Taking? Authorizing Provider  aspirin 81 MG EC tablet Take 1 tablet (81 mg total) by mouth daily. Patient taking differently: Take 81 mg by mouth daily as needed (blood thinner). 01/17/16  Yes [name], MD  atorvastatin (LIPITOR) 80 MG tablet TAKE 1 TABLET BY MOUTH DAILY. 05/20/20 05/20/21 Yes Strader, Fransisco Hertz, PA-C  carvedilol (COREG) 6.25 MG tablet TAKE 1 TABLET BY MOUTH 2 TIMES DAILY WITH A MEAL. 05/20/20 05/20/21 Yes Strader, Fransisco Hertz, PA-C  furosemide (LASIX) 20 MG tablet Take 1 tablet by mouth every morning for the next 3 days then 1 every morning as needed edema Patient taking differently: Take 20 mg by mouth daily as needed for fluid or edema. 02/01/21  Yes Kathyrn Drown, MD  gabapentin (NEURONTIN) 300 MG capsule One 2 times a day late afternoon and evening Patient taking differently: Take 300 mg by mouth 2 (two) times daily as needed (nerve pain). 01/28/21  Yes Kathyrn Drown, MD  lisinopril (ZESTRIL) 10 MG tablet TAKE 1 TABLET BY MOUTH AT BEDTIME. 05/20/20 05/20/21 Yes Strader, [place] M, PA-C  spironolactone (ALDACTONE) 25 MG tablet TAKE 1/2 TABLET BY MOUTH DAILY Patient taking differently: Take 25 mg by mouth every other day. 05/20/20 05/20/21 Yes Strader, Fransisco Hertz, PA-C    Social History   Socioeconomic History   Marital status: Married    Spouse name: Not on file   Number of children: 2   Years of education: Not on file   Highest education level: Not on file  Occupational History   Occupation: Maintanence    Employer: [employer]    Comment: [employer]  Tobacco Use   Smoking status: Former    Packs/day: 1.00    Years: 35.00    Pack years: 35.00    Types: Cigarettes    Quit date: 12/13/2015    Years since quitting: 5.2   Smokeless tobacco: Former    Quit date: 12/13/2015  Substance and Sexual Activity   Alcohol use: Yes    Alcohol/week: 0.0  standard drinks    Comment: rare   Drug use: No   Sexual activity: Yes  Other Topics Concern   Not on file  Social History Narrative   Not on file   Social Determinants of Health   Financial Resource Strain: Not on file  Food Insecurity: Not on file  Transportation Needs: Not on file  Physical Activity: Not on file  Stress: Not on file  Social Connections: Not on file  Intimate Partner Violence: Not on file     Family History  Problem Relation Age of Onset   Heart attack Paternal Grandfather    Heart failure  Father    Atrial fibrillation Father     ROS: '[x]'$  Positive   '[ ]'$  Negative   '[ ]'$  All sytems reviewed and are negative  Cardiac: '[]'$  chest pain/pressure '[]'$  palpitations '[]'$  SOB lying flat '[]'$  DOE  Vascular: '[]'$  pain in legs while walking '[]'$  pain in legs at rest '[]'$  pain in legs at night '[]'$  non-healing ulcers '[]'$  hx of DVT '[]'$  swelling in legs  Pulmonary: '[]'$  asthma/wheezing '[]'$  home O2  Neurologic: '[x]'$  hx of CVA    Hematologic: '[]'$  hx of cancer  Endocrine:   '[x]'$  diabetes '[]'$  thyroid disease  GI '[]'$  GERD  GU: '[]'$  CKD/renal failure '[]'$  HD--'[]'$  M/W/F or '[]'$  T/T/S  Psychiatric: '[]'$  anxiety '[]'$  depression  Musculoskeletal: '[]'$  arthritis '[]'$  joint pain  Integumentary: '[]'$  rashes '[]'$  ulcers  Constitutional: '[]'$  fever  '[]'$  chills  Physical Examination  Vitals:   03/09/21 0735 03/09/21 1119  BP: (!) 173/98 (!) 188/112  Pulse: 76 70  Resp: 18 18  Temp: 97.7 F (36.5 C) 98 F (36.7 C)  SpO2: 97% 99%   There is no height or weight on file to calculate BMI.  General:  WDWN in NAD Gait: Not observed HENT: WNL, normocephalic Pulmonary: normal non-labored breathing Cardiac: regular, without Murmur; without carotid bruits Abdomen:  soft, NT/ND; aortic pulse is not palpable Skin: without rashes Vascular Exam/Pulses:  Right Left  Radial 2+ (normal) 2+ (normal)  Popliteal Unable to palpate Unable to palpate  PT 2+ (normal) 2+ (normal)   Extremities:  without ischemic changes, without Gangrene , without cellulitis; without open wounds Musculoskeletal: no muscle wasting or atrophy  Neurologic: A&O X 3; speech is fluent/normal Psychiatric:  The pt has Normal affect.   CBC    Component Value Date/Time   WBC 7.4 03/08/2021 1326   RBC 4.85 03/08/2021 1326   HGB 15.3 03/08/2021 1340   HCT 45.0 03/08/2021 1340   PLT 243 03/08/2021 1326   MCV 92.6 03/08/2021 1326   MCH 31.8 03/08/2021 1326   MCHC 34.3 03/08/2021 1326   RDW 13.4 03/08/2021 1326   LYMPHSABS 3.8 03/08/2021 1326   MONOABS 0.6 03/08/2021 1326   EOSABS 0.1 03/08/2021 1326   BASOSABS 0.1 03/08/2021 1326    BMET    Component Value Date/Time   NA 138 03/09/2021 0801   K 4.4 03/09/2021 0801   CL 104 03/09/2021 0801   CO2 29 03/09/2021 0801   GLUCOSE 142 (H) 03/09/2021 0801   BUN 8 03/09/2021 0801   CREATININE 0.92 03/09/2021 0801   CALCIUM 9.1 03/09/2021 0801   GFRNONAA >60 03/09/2021 0801   GFRAA >60 06/21/2017 0740    COAGS: Lab Results  Component Value Date   INR 1.0 03/08/2021   INR 1.03 04/02/2016   INR 1.09 02/07/2016     Non-Invasive Vascular Imaging:   Carotid duplex 03/09/2021: Right:  80-99% ICA stenosis Left:  1-39% ICA stenosis  Vertebrals:  Bilateral vertebral arteries demonstrate antegrade flow.  Subclavians: Normal flow hemodynamics were seen in bilateral subclavian arteries.   CTA head/neck 03/08/2021: IMPRESSION: 1. Focal severe stenosis at the right P1/P2 junction with diminutive enhancement of the right PCA distal to this point. 2. Atherosclerotic plaque at the proximal right internal carotid artery resulting in 70-80% stenosis. A hypodense lesion extends superiorly from the level of the stenosis likely reflecting mural adherent thrombus. 3. Approximally 20-30% stenosis of the proximal left internal carotid artery.  MRI Brain 03/09/2021: IMPRESSION: 1. Multifocal acute infarcts in the right occipital lobe. Additional scattered  smaller  acute infarcts in the right frontal, parietal and posterior temporal lobes. Associated edema without mass effect. 2. Age advanced, moderate chronic microvascular ischemic disease.   ASSESSMENT/PLAN: This is a 60 y.o. male with symptomatic right ICA stenosis with previous hx of left CEA after CABG in 2017 by Dr. Trula Slade  -pt with symptomatic severe right ICA stenosis with small occipital stroke.  Pt's symptoms currently resolved.   -most likely for right CEA next week with Dr. Trula Slade.  He will see pt today and make further recommendations.  -long discussion about tobacco cessation and its importance.   -discussed that he will need continued follow up with our office to continue to monitor carotid stenosis.   -continue Plavix, asa, statin   Leontine Locket, PA-C [facility] [phone]   I agree with the above.  I have seen and evaluated patient.  Briefly this is a 60 year old gentleman who presented to the hospital with a occipital stroke.  His work-up revealed a severe right carotid stenosis which was felt to be the etiology of his stroke.  He does have intracranial disease.  He is known to me for having undergone left carotid endarterectomy several years ago.  We discussed proceeding with right carotid endarterectomy for stroke prevention.  I discussed the details of the operation with his family and the patient.  We discussed the risk of bleeding, stroke, nerve injury.  All of his questions were answered and he wishes to proceed.  This is been scheduled for Friday.  Annamarie Major

## 2021-03-09 NOTE — Progress Notes (Signed)
OT Cancellation Note  Patient Details Name: COSBY BIRKY MRN: [id number] DOB: [birth year]   Cancelled Treatment:    Reason Eval/Treat Not Completed: Patient at procedure or test/ unavailable (MRI) Will return later time.   Guliana Weyandt,HILLARY 03/09/2021, 8:42 AM Maurie Boettcher, OT/L   Acute OT Clinical Specialist Acute Rehabilitation Services Pager [phone] Office [phone]

## 2021-03-09 NOTE — Progress Notes (Addendum)
Family Medicine Teaching Service Daily Progress Note Intern Pager: [phone]  Patient name: [name] [name] Medical record number: [id number] Date of birth: 18-Aug-1960 Age: 60 y.o. Gender: male  Primary Care Provider: Kathyrn Drown, MD Consultants: Neurology Code Status: FULL  Pt Overview and Major Events to Date:  9/6: admitted for stroke  Assessment and Plan: [name] [name] is a 60 year old male presenting with occipital stroke.  PMH is significant for HFrEF (last EF 35 to 40%, 2018), ischemic cardiomyopathy s/p CABG with clipping of LA appendage, carotid stenosis status post LCEA in 2017 and tobacco abuse.  Stroke Patient continues to have no focal deficits. Lipid panel demonstrates compliance with high intensity statin, total cholesterol 114, HDL 26, LDL 63.  A1c 5.8% indicating prediabetes.  Telemetry reveals sinus rhythm, awaiting MRI brain, carotid ultrasound, and echo today.  Patient may be candidate for right-sided carotid endarterectomy given possible mural thrombus in the right internal carotid artery.  Neurology following, appreciate recommendations. - Consult VVS for possible CEA -MRI brain - Carotid Doppler ultrasound - Cardiac monitoring - Echo - ASA 81 mg daily - Lipitor 80 mg daily - PT/OT eval and treat - DVT PPx with Lovenox  HFrEF  s/pCABG x3 Patient is not volume overloaded, reports no symptoms that he recognizes of heart failure.  Continue to hold home Lasix.  Will restart home lisinopril and Aldactone. -Daily weights - Strict I's and O's -Continue home Coreg -Restart lisinopril 10 mg, spironolactone 25 mg daily - A.m. BMP  Hypertension Patient has remained significantly hypertensive.  He is out of the window for permissive hypertension.  Home regimen includes Coreg, lisinopril, spironolactone.  We will restart spironolactone and lisinopril. -Continue Coreg -Restart lisinopril 10 mg daily, spironolactone 25 mg daily - Continue to monitor  Postherpetic  neuralgia - Continue home gabapentin 300 mg as needed nightly  FEN/GI: Heart healthy diet PPx: Lovenox Dispo:Home pending clinical improvement . Barriers include need for imaging and assessment of CEA.   Subjective:  Patient downstairs for imaging.  Objective: Temp:  [97.4 F (36.3 C)-98.1 F (36.7 C)] 97.7 F (36.5 C) (09/07 0735) Pulse Rate:  [65-89] 76 (09/07 0735) Resp:  [10-20] 18 (09/07 0735) BP: (144-183)/(84-108) 173/98 (09/07 0735) SpO2:  [96 %-100 %] 97 % (09/07 0735) Physical Exam: Patient downstairs for imaging. Please see attending attestation.  Laboratory: Recent Labs  Lab 03/08/21 1326 03/08/21 1340  WBC 7.4  --   HGB 15.4 15.3  HCT 44.9 45.0  PLT 243  --    Recent Labs  Lab 03/08/21 1326 03/08/21 1340  NA 136 140  K 4.6 4.5  CL 105 103  CO2 26  --   BUN 11 11  CREATININE 0.98 0.90  CALCIUM 9.0  --   PROT 7.0  --   BILITOT 0.5  --   ALKPHOS 120  --   ALT 20  --   AST 21  --   GLUCOSE 104* 100*   Imaging/Diagnostic Tests: CT Angio Head W or Wo Contrast  Result Date: 03/08/2021 CLINICAL DATA:  Neuro deficit, stroke suspected EXAM: CT ANGIOGRAPHY HEAD AND NECK TECHNIQUE: Multidetector CT imaging of the head and neck was performed using the standard protocol during bolus administration of intravenous contrast. Multiplanar CT image reconstructions and MIPs were obtained to evaluate the vascular anatomy. Carotid stenosis measurements (when applicable) are obtained utilizing NASCET criteria, using the distal internal carotid diameter as the denominator. CONTRAST:  14m OMNIPAQUE IOHEXOL 350 MG/ML SOLN COMPARISON:  Same-day noncontrast CT head FINDINGS:  CTA NECK FINDINGS Aortic arch: There is mild calcified atherosclerotic plaque of the aortic arch. The branching pattern is normal. Postsurgical changes reflecting CABG are noted. Right carotid system: There is soft and calcified atherosclerotic plaque of the proximal right internal carotid artery resulting in  proximally 70-80% stenosis. A hypodense lesion extends superiorly from the level of the stenosis likely reflecting mural adherent thrombus (7-216, 8-113). The distal internal carotid artery is patent. The external carotid artery is patent. There is no dissection or aneurysm. Left carotid system: There is predominantly soft atherosclerotic plaque of the proximal left internal carotid artery resulting in up to 20-30% stenosis. There is no dissection or aneurysm. Vertebral arteries: There is mild calcified atherosclerotic plaque at the origin of the right vertebral artery. The vertebral arteries are otherwise patent bilaterally. There is no hemodynamically significant stenosis or occlusion. There is no dissection or aneurysm. Skeleton: There is degenerative change of the cervical spine most advanced at C5-C6 and C6-C7. There is no acute osseous abnormality or aggressive osseous lesion. Other neck: A right palatine tonsillith is noted. Upper chest: There is scarring in the lung apices. Review of the MIP images confirms the above findings CTA HEAD FINDINGS Anterior circulation: There is calcification of the bilateral cavernous ICAs resulting in up to mild stenosis on the left. There is no high-grade stenosis or occlusion. The bilateral MCAs and ACAs are patent. There is no aneurysm. Posterior circulation: The V4 segments of the vertebral arteries are patent with minimal calcified atherosclerotic plaque on the right. The basilar artery is patent. There is focal severe stenosis of the right PCA at the P1/P2 junction (7-100) with diminutive enhancement throughout the remainder of the right PCA. The left PCA is patent. Venous sinuses: As permitted by contrast timing, patent. Anatomic variants: None. Review of the MIP images confirms the above findings IMPRESSION: 1. Focal severe stenosis at the right P1/P2 junction with diminutive enhancement of the right PCA distal to this point. 2. Atherosclerotic plaque at the proximal  right internal carotid artery resulting in 70-80% stenosis. A hypodense lesion extends superiorly from the level of the stenosis likely reflecting mural adherent thrombus. 3. Approximally 20-30% stenosis of the proximal left internal carotid artery. Electronically Signed   By: Valetta Mole M.D.   On: 03/08/2021 14:30   CT HEAD WO CONTRAST  Result Date: 03/08/2021 CLINICAL DATA:  Blurry vision. EXAM: CT HEAD WITHOUT CONTRAST TECHNIQUE: Contiguous axial images were obtained from the base of the skull through the vertex without intravenous contrast. COMPARISON:  None. FINDINGS: Brain: Right occipital lobe hypodensities with loss of the normal gray-white matter differentiation (series 4, image 14; series 6, image 63). No evidence of hemorrhage, hydrocephalus, extra-axial collection or mass lesion/mass effect. Scattered periventricular and subcortical white matter hypodensities are nonspecific, but favored to reflect chronic microvascular ischemic changes. Vascular: Atherosclerotic vascular calcification of the carotid siphons. No hyperdense vessel. Skull: Normal. Negative for fracture or focal lesion. Sinuses/Orbits: No acute finding. Other: None. IMPRESSION: 1. Right occipital lobe acute to subacute infarct. Electronically Signed   By: Titus Dubin M.D.   On: 03/08/2021 12:11   CT Angio Neck W and/or Wo Contrast  Result Date: 03/08/2021 CLINICAL DATA:  Neuro deficit, stroke suspected EXAM: CT ANGIOGRAPHY HEAD AND NECK TECHNIQUE: Multidetector CT imaging of the head and neck was performed using the standard protocol during bolus administration of intravenous contrast. Multiplanar CT image reconstructions and MIPs were obtained to evaluate the vascular anatomy. Carotid stenosis measurements (when applicable) are obtained utilizing NASCET  criteria, using the distal internal carotid diameter as the denominator. CONTRAST:  15m OMNIPAQUE IOHEXOL 350 MG/ML SOLN COMPARISON:  Same-day noncontrast CT head FINDINGS: CTA  NECK FINDINGS Aortic arch: There is mild calcified atherosclerotic plaque of the aortic arch. The branching pattern is normal. Postsurgical changes reflecting CABG are noted. Right carotid system: There is soft and calcified atherosclerotic plaque of the proximal right internal carotid artery resulting in proximally 70-80% stenosis. A hypodense lesion extends superiorly from the level of the stenosis likely reflecting mural adherent thrombus (7-216, 8-113). The distal internal carotid artery is patent. The external carotid artery is patent. There is no dissection or aneurysm. Left carotid system: There is predominantly soft atherosclerotic plaque of the proximal left internal carotid artery resulting in up to 20-30% stenosis. There is no dissection or aneurysm. Vertebral arteries: There is mild calcified atherosclerotic plaque at the origin of the right vertebral artery. The vertebral arteries are otherwise patent bilaterally. There is no hemodynamically significant stenosis or occlusion. There is no dissection or aneurysm. Skeleton: There is degenerative change of the cervical spine most advanced at C5-C6 and C6-C7. There is no acute osseous abnormality or aggressive osseous lesion. Other neck: A right palatine tonsillith is noted. Upper chest: There is scarring in the lung apices. Review of the MIP images confirms the above findings CTA HEAD FINDINGS Anterior circulation: There is calcification of the bilateral cavernous ICAs resulting in up to mild stenosis on the left. There is no high-grade stenosis or occlusion. The bilateral MCAs and ACAs are patent. There is no aneurysm. Posterior circulation: The V4 segments of the vertebral arteries are patent with minimal calcified atherosclerotic plaque on the right. The basilar artery is patent. There is focal severe stenosis of the right PCA at the P1/P2 junction (7-100) with diminutive enhancement throughout the remainder of the right PCA. The left PCA is patent.  Venous sinuses: As permitted by contrast timing, patent. Anatomic variants: None. Review of the MIP images confirms the above findings IMPRESSION: 1. Focal severe stenosis at the right P1/P2 junction with diminutive enhancement of the right PCA distal to this point. 2. Atherosclerotic plaque at the proximal right internal carotid artery resulting in 70-80% stenosis. A hypodense lesion extends superiorly from the level of the stenosis likely reflecting mural adherent thrombus. 3. Approximally 20-30% stenosis of the proximal left internal carotid artery. Electronically Signed   By: PValetta MoleM.D.   On: 03/08/2021 14:30     MGladys Damme MD 03/09/2021, 7:56 AM PGY-3, [facility] pager: [phone] text pages welcome

## 2021-03-09 NOTE — Progress Notes (Signed)
Pt wheeled off unit with all belongings.  

## 2021-03-09 NOTE — Consult Note (Addendum)
Hospital Consult    Reason for Consult:  symptomatic right ICA stenosis  Requesting Physician:  Chauncey Reading MRN #:  [id number]  History of Present Illness: This is a 60 y.o. male who presented to the hospital yesterday with stroke.  Pt states that on Saturday, he started having some numbness in his left forearm and fingers that lasted about 15 minutes and improved.  This did happen several times.  He states that he also had a terrible right sided headache, which did cause some blurry vision and this improved when the headache improved.  He states he also had some numbness around his ear, but this has also improved.  He states that he went back to work on Tuesday and was trying to write numbers and could not write them but he is not sure if this was due to the numbness in his fingers or visual changes.  He denies any other stroke sx.  His wife Angela Nevin used to work in our office.  She did call our office and given his symptoms, it was felt that he come to the Emergency room.  Workup reveals 70-80% right ICA stenosis on CTA and 80-99% stenosis of right ICA on duplex.  The left is at 1-39%.    Pt denies any chest pain, claudication, family hx of AAA.    He had quit smoking after his CABG but has since started back and is smoking about 1/2 ppd.  He states he has been compliant with his asa/statin.  States he just had an echo and his EF is up to 50% (was 20-25% at time of CABG).  Pt has hx of left CEA in 2017 by Dr. Trula Slade as well as CABG prior to that in 2017.   The pt is on a statin for cholesterol management.  The pt is on a daily aspirin.   Other AC:  Plavix/Lovenox The pt is on BB, ACEI, aldactone for hypertension.   The pt is diabetic.   Tobacco hx:  current (had quit in 2017)  Past Medical History:  Diagnosis Date   Acute systolic congestive heart failure (HCC)    Atrial fibrillation with rapid ventricular response ([place]) 12/13/2015   "had a heart monitor and can't find a source for the a. fib"    Cardiomyopathy ([place]) 12/13/2015   Cardiomyopathy, ischemic 12/14/2015   a. EF 20-25% in 12/2015 b. EF at 35-40% by repeat echo in 10/2016   Coronary artery disease involving native coronary artery with unstable angina pectoris ([place]) 12/15/2015   Hypercholesterolemia 12/15/2015   Mitral regurgitation 12/14/2015   moderate   NSTEMI (non-ST elevated myocardial infarction) (HCC)    PONV (postoperative nausea and vomiting)    "sick with very first surgery but nothing since" in 1990s   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 12/17/2015   LIMA to LAD, SVG to OM, SVG to PDA, EVH via right thigh with clipping of LA appendage   Tobacco abuse     Past Surgical History:  Procedure Laterality Date   CARDIAC CATHETERIZATION N/A 12/15/2015   Procedure: Right/Left Heart Cath and Coronary Angiography;  Surgeon: Wellington Hampshire, MD;  Location: [facility];  Service: Cardiovascular;  Laterality: N/A;   CLIPPING OF ATRIAL APPENDAGE Left 12/17/2015   Procedure: CLIPPING OF ATRIAL APPENDAGE;  Surgeon: [name], MD;  Location: [facility];  Service: Open Heart Surgery;  Laterality: Left;   COLONOSCOPY WITH PROPOFOL N/A 06/21/2016   Procedure: COLONOSCOPY WITH PROPOFOL;  Surgeon: Remo Lipps  Gaye Alken, MD;  Location: [facility];  Service: Gastroenterology;  Laterality: N/A;   CORONARY ARTERY BYPASS GRAFT N/A 12/17/2015   Procedure: CORONARY ARTERY BYPASS GRAFTING (CABG) x three , using left internal mammary artery and right leg greater saphenous vein;  Surgeon: [name], MD;  Location: [facility];  Service: Open Heart Surgery;  Laterality: N/A;   ENDARTERECTOMY Left 02/09/2016   Procedure: LEFT CAROTID ENDARTERECTOMY;  Surgeon: Serafina Mitchell, MD;  Location: [facility];  Service: Vascular;  Laterality: Left;   KNEE ARTHROSCOPY Right 1991   PATCH ANGIOPLASTY Left 02/09/2016   Procedure: PATCH ANGIOPLASTY USING Rueben Bash BIOLOGIC PATCH;  Surgeon: Serafina Mitchell, MD;  Location: [facility];  Service: Vascular;   Laterality: Left;    No Known Allergies  Prior to Admission medications   Medication Sig Start Date End Date Taking? Authorizing Provider  aspirin 81 MG EC tablet Take 1 tablet (81 mg total) by mouth daily. Patient taking differently: Take 81 mg by mouth daily as needed (blood thinner). 01/17/16  Yes [name], MD  atorvastatin (LIPITOR) 80 MG tablet TAKE 1 TABLET BY MOUTH DAILY. 05/20/20 05/20/21 Yes Strader, Fransisco Hertz, PA-C  carvedilol (COREG) 6.25 MG tablet TAKE 1 TABLET BY MOUTH 2 TIMES DAILY WITH A MEAL. 05/20/20 05/20/21 Yes Strader, Fransisco Hertz, PA-C  furosemide (LASIX) 20 MG tablet Take 1 tablet by mouth every morning for the next 3 days then 1 every morning as needed edema Patient taking differently: Take 20 mg by mouth daily as needed for fluid or edema. 02/01/21  Yes Kathyrn Drown, MD  gabapentin (NEURONTIN) 300 MG capsule One 2 times a day late afternoon and evening Patient taking differently: Take 300 mg by mouth 2 (two) times daily as needed (nerve pain). 01/28/21  Yes Kathyrn Drown, MD  lisinopril (ZESTRIL) 10 MG tablet TAKE 1 TABLET BY MOUTH AT BEDTIME. 05/20/20 05/20/21 Yes Strader, [place] M, PA-C  spironolactone (ALDACTONE) 25 MG tablet TAKE 1/2 TABLET BY MOUTH DAILY Patient taking differently: Take 25 mg by mouth every other day. 05/20/20 05/20/21 Yes Strader, Fransisco Hertz, PA-C    Social History   Socioeconomic History   Marital status: Married    Spouse name: Not on file   Number of children: 2   Years of education: Not on file   Highest education level: Not on file  Occupational History   Occupation: Maintanence    Employer: [employer]    Comment: [employer]  Tobacco Use   Smoking status: Former    Packs/day: 1.00    Years: 35.00    Pack years: 35.00    Types: Cigarettes    Quit date: 12/13/2015    Years since quitting: 5.2   Smokeless tobacco: Former    Quit date: 12/13/2015  Substance and Sexual Activity   Alcohol use: Yes    Alcohol/week: 0.0  standard drinks    Comment: rare   Drug use: No   Sexual activity: Yes  Other Topics Concern   Not on file  Social History Narrative   Not on file   Social Determinants of Health   Financial Resource Strain: Not on file  Food Insecurity: Not on file  Transportation Needs: Not on file  Physical Activity: Not on file  Stress: Not on file  Social Connections: Not on file  Intimate Partner Violence: Not on file     Family History  Problem Relation Age of Onset   Heart attack Paternal Grandfather    Heart failure  Father    Atrial fibrillation Father     ROS: '[x]'$  Positive   '[ ]'$  Negative   '[ ]'$  All sytems reviewed and are negative  Cardiac: '[]'$  chest pain/pressure '[]'$  palpitations '[]'$  SOB lying flat '[]'$  DOE  Vascular: '[]'$  pain in legs while walking '[]'$  pain in legs at rest '[]'$  pain in legs at night '[]'$  non-healing ulcers '[]'$  hx of DVT '[]'$  swelling in legs  Pulmonary: '[]'$  asthma/wheezing '[]'$  home O2  Neurologic: '[x]'$  hx of CVA    Hematologic: '[]'$  hx of cancer  Endocrine:   '[x]'$  diabetes '[]'$  thyroid disease  GI '[]'$  GERD  GU: '[]'$  CKD/renal failure '[]'$  HD--'[]'$  M/W/F or '[]'$  T/T/S  Psychiatric: '[]'$  anxiety '[]'$  depression  Musculoskeletal: '[]'$  arthritis '[]'$  joint pain  Integumentary: '[]'$  rashes '[]'$  ulcers  Constitutional: '[]'$  fever  '[]'$  chills  Physical Examination  Vitals:   03/09/21 0735 03/09/21 1119  BP: (!) 173/98 (!) 188/112  Pulse: 76 70  Resp: 18 18  Temp: 97.7 F (36.5 C) 98 F (36.7 C)  SpO2: 97% 99%   There is no height or weight on file to calculate BMI.  General:  WDWN in NAD Gait: Not observed HENT: WNL, normocephalic Pulmonary: normal non-labored breathing Cardiac: regular, without Murmur; without carotid bruits Abdomen:  soft, NT/ND; aortic pulse is not palpable Skin: without rashes Vascular Exam/Pulses:  Right Left  Radial 2+ (normal) 2+ (normal)  Popliteal Unable to palpate Unable to palpate  PT 2+ (normal) 2+ (normal)   Extremities:  without ischemic changes, without Gangrene , without cellulitis; without open wounds Musculoskeletal: no muscle wasting or atrophy  Neurologic: A&O X 3; speech is fluent/normal Psychiatric:  The pt has Normal affect.   CBC    Component Value Date/Time   WBC 7.4 03/08/2021 1326   RBC 4.85 03/08/2021 1326   HGB 15.3 03/08/2021 1340   HCT 45.0 03/08/2021 1340   PLT 243 03/08/2021 1326   MCV 92.6 03/08/2021 1326   MCH 31.8 03/08/2021 1326   MCHC 34.3 03/08/2021 1326   RDW 13.4 03/08/2021 1326   LYMPHSABS 3.8 03/08/2021 1326   MONOABS 0.6 03/08/2021 1326   EOSABS 0.1 03/08/2021 1326   BASOSABS 0.1 03/08/2021 1326    BMET    Component Value Date/Time   NA 138 03/09/2021 0801   K 4.4 03/09/2021 0801   CL 104 03/09/2021 0801   CO2 29 03/09/2021 0801   GLUCOSE 142 (H) 03/09/2021 0801   BUN 8 03/09/2021 0801   CREATININE 0.92 03/09/2021 0801   CALCIUM 9.1 03/09/2021 0801   GFRNONAA >60 03/09/2021 0801   GFRAA >60 06/21/2017 0740    COAGS: Lab Results  Component Value Date   INR 1.0 03/08/2021   INR 1.03 04/02/2016   INR 1.09 02/07/2016     Non-Invasive Vascular Imaging:   Carotid duplex 03/09/2021: Right:  80-99% ICA stenosis Left:  1-39% ICA stenosis  Vertebrals:  Bilateral vertebral arteries demonstrate antegrade flow.  Subclavians: Normal flow hemodynamics were seen in bilateral subclavian arteries.   CTA head/neck 03/08/2021: IMPRESSION: 1. Focal severe stenosis at the right P1/P2 junction with diminutive enhancement of the right PCA distal to this point. 2. Atherosclerotic plaque at the proximal right internal carotid artery resulting in 70-80% stenosis. A hypodense lesion extends superiorly from the level of the stenosis likely reflecting mural adherent thrombus. 3. Approximally 20-30% stenosis of the proximal left internal carotid artery.  MRI Brain 03/09/2021: IMPRESSION: 1. Multifocal acute infarcts in the right occipital lobe. Additional scattered  smaller  acute infarcts in the right frontal, parietal and posterior temporal lobes. Associated edema without mass effect. 2. Age advanced, moderate chronic microvascular ischemic disease.   ASSESSMENT/PLAN: This is a 60 y.o. male with symptomatic right ICA stenosis with previous hx of left CEA after CABG in 2017 by Dr. Trula Slade  -pt with symptomatic severe right ICA stenosis with small occipital stroke.  Pt's symptoms currently resolved.   -most likely for right CEA next week with Dr. Trula Slade.  He will see pt today and make further recommendations.  -long discussion about tobacco cessation and its importance.   -discussed that he will need continued follow up with our office to continue to monitor carotid stenosis.   -continue Plavix, asa, statin   Leontine Locket, PA-C [facility] [phone]   I agree with the above.  I have seen and evaluated patient.  Briefly this is a 60 year old gentleman who presented to the hospital with a occipital stroke.  His work-up revealed a severe right carotid stenosis which was felt to be the etiology of his stroke.  He does have intracranial disease.  He is known to me for having undergone left carotid endarterectomy several years ago.  We discussed proceeding with right carotid endarterectomy for stroke prevention.  I discussed the details of the operation with his family and the patient.  We discussed the risk of bleeding, stroke, nerve injury.  All of his questions were answered and he wishes to proceed.  This is been scheduled for Friday.  Annamarie Major

## 2021-03-10 ENCOUNTER — Encounter (HOSPITAL_COMMUNITY): Payer: Self-pay | Admitting: Surgery

## 2021-03-10 ENCOUNTER — Other Ambulatory Visit: Payer: Self-pay

## 2021-03-10 NOTE — Progress Notes (Signed)
Spoke with pt and his wife, [name] [name] for pre-op call. Pt has hx of CAD with CABG in 2017. A-fib is listed on his medical history but states that he has seen Dr. Haroldine Laws and was told he did not have A-fib. Pt states he is not diabetic. Denies any recent chest pain or shortness of breath.   Pt was an inpatient on 9/6 - 9/7. Had negative Covid test on 03/08/21.

## 2021-03-11 ENCOUNTER — Encounter (HOSPITAL_COMMUNITY)
Admission: RE | Disposition: A | Discharge status: Home / Self Care | Payer: Self-pay | Source: Ambulatory Visit | Attending: Surgery

## 2021-03-11 ENCOUNTER — Encounter (HOSPITAL_COMMUNITY): Payer: Self-pay | Admitting: Surgery

## 2021-03-11 ENCOUNTER — Inpatient Hospital Stay (HOSPITAL_COMMUNITY): Payer: 59 | Admitting: Anesthesiology

## 2021-03-11 ENCOUNTER — Inpatient Hospital Stay (HOSPITAL_COMMUNITY)
Admission: RE | Admit: 2021-03-11 | Discharge: 2021-03-12 | DRG: 038 | Disposition: A | Discharge status: Home / Self Care | Payer: 59 | Source: Ambulatory Visit | Attending: Family Medicine | Admitting: Family Medicine

## 2021-03-11 DIAGNOSIS — Z7902 Long term (current) use of antithrombotics/antiplatelets: Secondary | ICD-10-CM | POA: Diagnosis not present

## 2021-03-11 DIAGNOSIS — G930 Cerebral cysts: Secondary | ICD-10-CM | POA: Diagnosis present

## 2021-03-11 DIAGNOSIS — I6621 Occlusion and stenosis of right posterior cerebral artery: Secondary | ICD-10-CM | POA: Diagnosis present

## 2021-03-11 DIAGNOSIS — J358 Other chronic diseases of tonsils and adenoids: Secondary | ICD-10-CM | POA: Diagnosis present

## 2021-03-11 DIAGNOSIS — I110 Hypertensive heart disease with heart failure: Secondary | ICD-10-CM | POA: Diagnosis not present

## 2021-03-11 DIAGNOSIS — I639 Cerebral infarction, unspecified: Principal | ICD-10-CM | POA: Diagnosis present

## 2021-03-11 DIAGNOSIS — H53462 Homonymous bilateral field defects, left side: Secondary | ICD-10-CM | POA: Diagnosis present

## 2021-03-11 DIAGNOSIS — Z9889 Other specified postprocedural states: Secondary | ICD-10-CM | POA: Diagnosis not present

## 2021-03-11 DIAGNOSIS — I4891 Unspecified atrial fibrillation: Secondary | ICD-10-CM | POA: Diagnosis not present

## 2021-03-11 DIAGNOSIS — I6521 Occlusion and stenosis of right carotid artery: Secondary | ICD-10-CM | POA: Diagnosis present

## 2021-03-11 DIAGNOSIS — H538 Other visual disturbances: Secondary | ICD-10-CM | POA: Diagnosis present

## 2021-03-11 DIAGNOSIS — Z7982 Long term (current) use of aspirin: Secondary | ICD-10-CM

## 2021-03-11 DIAGNOSIS — I700 Atherosclerosis of aorta: Secondary | ICD-10-CM | POA: Diagnosis present

## 2021-03-11 DIAGNOSIS — B0229 Other postherpetic nervous system involvement: Secondary | ICD-10-CM | POA: Diagnosis not present

## 2021-03-11 DIAGNOSIS — J9601 Acute respiratory failure with hypoxia: Secondary | ICD-10-CM | POA: Diagnosis not present

## 2021-03-11 DIAGNOSIS — Z8249 Family history of ischemic heart disease and other diseases of the circulatory system: Secondary | ICD-10-CM | POA: Diagnosis not present

## 2021-03-11 DIAGNOSIS — I255 Ischemic cardiomyopathy: Secondary | ICD-10-CM | POA: Diagnosis present

## 2021-03-11 DIAGNOSIS — I2510 Atherosclerotic heart disease of native coronary artery without angina pectoris: Secondary | ICD-10-CM | POA: Diagnosis present

## 2021-03-11 DIAGNOSIS — I6523 Occlusion and stenosis of bilateral carotid arteries: Secondary | ICD-10-CM | POA: Diagnosis present

## 2021-03-11 DIAGNOSIS — E7800 Pure hypercholesterolemia, unspecified: Secondary | ICD-10-CM | POA: Diagnosis not present

## 2021-03-11 DIAGNOSIS — Z8673 Personal history of transient ischemic attack (TIA), and cerebral infarction without residual deficits: Secondary | ICD-10-CM

## 2021-03-11 DIAGNOSIS — E119 Type 2 diabetes mellitus without complications: Secondary | ICD-10-CM | POA: Diagnosis present

## 2021-03-11 DIAGNOSIS — Z951 Presence of aortocoronary bypass graft: Secondary | ICD-10-CM | POA: Diagnosis not present

## 2021-03-11 DIAGNOSIS — I5042 Chronic combined systolic (congestive) and diastolic (congestive) heart failure: Secondary | ICD-10-CM | POA: Diagnosis not present

## 2021-03-11 DIAGNOSIS — Z79899 Other long term (current) drug therapy: Secondary | ICD-10-CM | POA: Diagnosis not present

## 2021-03-11 DIAGNOSIS — Z8719 Personal history of other diseases of the digestive system: Secondary | ICD-10-CM | POA: Diagnosis not present

## 2021-03-11 DIAGNOSIS — F17210 Nicotine dependence, cigarettes, uncomplicated: Secondary | ICD-10-CM | POA: Diagnosis present

## 2021-03-11 DIAGNOSIS — Z20822 Contact with and (suspected) exposure to covid-19: Secondary | ICD-10-CM | POA: Diagnosis present

## 2021-03-11 DIAGNOSIS — R590 Localized enlarged lymph nodes: Secondary | ICD-10-CM | POA: Diagnosis not present

## 2021-03-11 DIAGNOSIS — I252 Old myocardial infarction: Secondary | ICD-10-CM | POA: Diagnosis not present

## 2021-03-11 HISTORY — DX: Cerebral infarction, unspecified: I63.9

## 2021-03-11 HISTORY — PX: PATCH ANGIOPLASTY: SHX6230

## 2021-03-11 HISTORY — PX: ENDARTERECTOMY: SHX5162

## 2021-03-11 LAB — COMPREHENSIVE METABOLIC PANEL
ALT: 23 U/L (ref 0–44)
AST: 24 U/L (ref 15–41)
Albumin: 3.3 g/dL — ABNORMAL LOW (ref 3.5–5.0)
Alkaline Phosphatase: 132 U/L — ABNORMAL HIGH (ref 38–126)
Anion gap: 8 (ref 5–15)
BUN: 12 mg/dL (ref 6–20)
CO2: 23 mmol/L (ref 22–32)
Calcium: 8.6 mg/dL — ABNORMAL LOW (ref 8.9–10.3)
Chloride: 103 mmol/L (ref 98–111)
Creatinine, Ser: 1.05 mg/dL (ref 0.61–1.24)
GFR, Estimated: >60 mL/min (ref >60)
Glucose, Bld: 224 mg/dL — ABNORMAL HIGH (ref 70–99)
Potassium: 4 mmol/L (ref 3.5–5.1)
Sodium: 134 mmol/L — ABNORMAL LOW (ref 135–145)
Total Bilirubin: 1.2 mg/dL (ref 0.3–1.2)
Total Protein: 6.6 g/dL (ref 6.5–8.1)

## 2021-03-11 LAB — CBC
HCT: 43 % (ref 39.0–52.0)
Hemoglobin: 15.1 g/dL (ref 13.0–17.0)
MCH: 31.8 pg (ref 26.0–34.0)
MCHC: 35.1 g/dL (ref 30.0–36.0)
MCV: 90.5 fL (ref 80.0–100.0)
Platelets: 244 10*3/uL (ref 150–400)
RBC: 4.75 MIL/uL (ref 4.22–5.81)
RDW: 13.3 % (ref 11.5–15.5)
WBC: 10.3 10*3/uL (ref 4.0–10.5)
nRBC: 0 % (ref 0.0–0.2)

## 2021-03-11 LAB — POCT ACTIVATED CLOTTING TIME: Activated Clotting Time: 335 seconds

## 2021-03-11 LAB — APTT: aPTT: 33 seconds (ref 24–36)

## 2021-03-11 LAB — SURGICAL PCR SCREEN
MRSA, PCR: NEGATIVE
Staphylococcus aureus: NEGATIVE

## 2021-03-11 LAB — PROTIME-INR
INR: 1 (ref 0.8–1.2)
Prothrombin Time: 12.7 seconds (ref 11.4–15.2)

## 2021-03-11 LAB — TYPE AND SCREEN
ABO/RH(D): O POS
Antibody Screen: NEGATIVE

## 2021-03-11 SURGERY — ENDARTERECTOMY, CAROTID
Anesthesia: General | Site: Neck | Laterality: Right

## 2021-03-11 MED ORDER — OXYCODONE HCL 5 MG/5ML PO SOLN
5.0000 mg | Freq: Once | ORAL | Status: DC | PRN
Start: 2021-03-11 — End: 2021-03-11

## 2021-03-11 MED ORDER — SODIUM CHLORIDE 0.9 % IV SOLN: 500.0000 mL | Freq: Once | INTRAVENOUS | Status: DC | PRN

## 2021-03-11 MED ORDER — CLOPIDOGREL BISULFATE 75 MG PO TABS
75.0000 mg | ORAL_TABLET | Freq: Every day | ORAL | Status: DC
  Administered 2021-03-12: 75 mg via ORAL
  Filled 2021-03-11: qty 1

## 2021-03-11 MED ORDER — LIDOCAINE HCL (PF) 1 % IJ SOLN
INTRAMUSCULAR | Status: AC
  Filled 2021-03-11: qty 5

## 2021-03-11 MED ORDER — GLYCOPYRROLATE 0.2 MG/ML IJ SOLN
INTRAMUSCULAR | Status: DC | PRN
  Administered 2021-03-11: .2 mg via INTRAVENOUS

## 2021-03-11 MED ORDER — CHLORHEXIDINE GLUCONATE CLOTH 2 % EX PADS: 6.0000 | MEDICATED_PAD | Freq: Once | CUTANEOUS | Status: DC

## 2021-03-11 MED ORDER — AMISULPRIDE (ANTIEMETIC) 5 MG/2ML IV SOLN: 10.0000 mg | Freq: Once | INTRAVENOUS | Status: DC | PRN

## 2021-03-11 MED ORDER — GLYCOPYRROLATE PF 0.2 MG/ML IJ SOSY
PREFILLED_SYRINGE | INTRAMUSCULAR | Status: AC
  Filled 2021-03-11: qty 1

## 2021-03-11 MED ORDER — LACTATED RINGERS IV SOLN: INTRAVENOUS | Status: DC | PRN

## 2021-03-11 MED ORDER — SUGAMMADEX SODIUM 200 MG/2ML IV SOLN
INTRAVENOUS | Status: DC | PRN
  Administered 2021-03-11: 200 mg via INTRAVENOUS

## 2021-03-11 MED ORDER — ROCURONIUM BROMIDE 10 MG/ML (PF) SYRINGE
PREFILLED_SYRINGE | INTRAVENOUS | Status: DC | PRN
  Administered 2021-03-11: 70 mg via INTRAVENOUS

## 2021-03-11 MED ORDER — DEXAMETHASONE SODIUM PHOSPHATE 10 MG/ML IJ SOLN
INTRAMUSCULAR | Status: DC | PRN
  Administered 2021-03-11: 8 mg via INTRAVENOUS

## 2021-03-11 MED ORDER — PHENOL 1.4 % MT LIQD: 1.0000 | OROMUCOSAL | Status: DC | PRN

## 2021-03-11 MED ORDER — OXYCODONE-ACETAMINOPHEN 5-325 MG PO TABS: 1.0000 | ORAL_TABLET | ORAL | Status: DC | PRN

## 2021-03-11 MED ORDER — DEXAMETHASONE SODIUM PHOSPHATE 10 MG/ML IJ SOLN
INTRAMUSCULAR | Status: AC
  Filled 2021-03-11: qty 1

## 2021-03-11 MED ORDER — HYDRALAZINE HCL 20 MG/ML IJ SOLN
5.0000 mg | INTRAMUSCULAR | Status: DC | PRN
  Filled 2021-03-11: qty 1

## 2021-03-11 MED ORDER — PHENYLEPHRINE 40 MCG/ML (10ML) SYRINGE FOR IV PUSH (FOR BLOOD PRESSURE SUPPORT)
PREFILLED_SYRINGE | INTRAVENOUS | Status: DC | PRN
  Administered 2021-03-11: 120 ug via INTRAVENOUS

## 2021-03-11 MED ORDER — ACETAMINOPHEN 10 MG/ML IV SOLN
INTRAVENOUS | Status: AC
  Filled 2021-03-11: qty 100

## 2021-03-11 MED ORDER — HEPARIN SODIUM (PORCINE) 1000 UNIT/ML IJ SOLN
INTRAMUSCULAR | Status: DC | PRN
  Administered 2021-03-11: 10000 [IU] via INTRAVENOUS

## 2021-03-11 MED ORDER — PROPOFOL 10 MG/ML IV BOLUS
INTRAVENOUS | Status: AC
  Filled 2021-03-11: qty 20

## 2021-03-11 MED ORDER — ALUM & MAG HYDROXIDE-SIMETH 200-200-20 MG/5ML PO SUSP: 15.0000 mL | ORAL | Status: DC | PRN

## 2021-03-11 MED ORDER — ATORVASTATIN CALCIUM 80 MG PO TABS
80.0000 mg | ORAL_TABLET | Freq: Every day | ORAL | Status: DC
  Administered 2021-03-11 – 2021-03-12 (×2): 80 mg via ORAL
  Filled 2021-03-11 (×2): qty 1

## 2021-03-11 MED ORDER — ORAL CARE MOUTH RINSE: 15.0000 mL | Freq: Once | OROMUCOSAL | Status: AC

## 2021-03-11 MED ORDER — PROTAMINE SULFATE 10 MG/ML IV SOLN
INTRAVENOUS | Status: DC | PRN
  Administered 2021-03-11: 50 mg via INTRAVENOUS

## 2021-03-11 MED ORDER — MAGNESIUM SULFATE 2 GM/50ML IV SOLN: 2.0000 g | Freq: Every day | INTRAVENOUS | Status: DC | PRN

## 2021-03-11 MED ORDER — PROPOFOL 10 MG/ML IV BOLUS
INTRAVENOUS | Status: DC | PRN
  Administered 2021-03-11: 150 mg via INTRAVENOUS
  Administered 2021-03-11: 20 mg via INTRAVENOUS
  Administered 2021-03-11: 30 mg via INTRAVENOUS

## 2021-03-11 MED ORDER — POTASSIUM CHLORIDE CRYS ER 20 MEQ PO TBCR: 20.0000 meq | EXTENDED_RELEASE_TABLET | Freq: Every day | ORAL | Status: DC | PRN

## 2021-03-11 MED ORDER — OXYCODONE HCL 5 MG PO TABS
5.0000 mg | ORAL_TABLET | Freq: Once | ORAL | Status: DC | PRN
Start: 2021-03-11 — End: 2021-03-11

## 2021-03-11 MED ORDER — ACETAMINOPHEN 325 MG PO TABS
325.0000 mg | ORAL_TABLET | ORAL | Status: DC | PRN
Start: 2021-03-11 — End: 2021-03-12

## 2021-03-11 MED ORDER — DOCUSATE SODIUM 100 MG PO CAPS
100.0000 mg | ORAL_CAPSULE | Freq: Every day | ORAL | Status: DC
  Administered 2021-03-12: 100 mg via ORAL
  Filled 2021-03-11: qty 1

## 2021-03-11 MED ORDER — PANTOPRAZOLE SODIUM 40 MG PO TBEC
40.0000 mg | DELAYED_RELEASE_TABLET | Freq: Every day | ORAL | Status: DC
  Filled 2021-03-11: qty 1

## 2021-03-11 MED ORDER — 0.9 % SODIUM CHLORIDE (POUR BTL) OPTIME
TOPICAL | Status: DC | PRN
  Administered 2021-03-11: 2000 mL

## 2021-03-11 MED ORDER — FENTANYL CITRATE (PF) 250 MCG/5ML IJ SOLN
INTRAMUSCULAR | Status: AC
  Filled 2021-03-11: qty 5

## 2021-03-11 MED ORDER — ONDANSETRON HCL 4 MG/2ML IJ SOLN: 4.0000 mg | Freq: Four times a day (QID) | INTRAMUSCULAR | Status: DC | PRN

## 2021-03-11 MED ORDER — CHLORHEXIDINE GLUCONATE 0.12 % MT SOLN
15.0000 mL | Freq: Once | OROMUCOSAL | Status: AC
  Administered 2021-03-11: 15 mL via OROMUCOSAL
  Filled 2021-03-11: qty 15

## 2021-03-11 MED ORDER — METOPROLOL TARTRATE 5 MG/5ML IV SOLN: 2.0000 mg | INTRAVENOUS | Status: DC | PRN

## 2021-03-11 MED ORDER — SODIUM CHLORIDE 0.9 % IV SOLN: INTRAVENOUS | Status: DC

## 2021-03-11 MED ORDER — ONDANSETRON HCL 4 MG/2ML IJ SOLN: 4.0000 mg | Freq: Once | INTRAMUSCULAR | Status: DC | PRN

## 2021-03-11 MED ORDER — HEPARIN 6000 UNIT IRRIGATION SOLUTION
Status: DC | PRN
  Administered 2021-03-11: 1

## 2021-03-11 MED ORDER — ONDANSETRON HCL 4 MG/2ML IJ SOLN
INTRAMUSCULAR | Status: AC
  Filled 2021-03-11: qty 2

## 2021-03-11 MED ORDER — HEMOSTATIC AGENTS (NO CHARGE) OPTIME
TOPICAL | Status: DC | PRN
  Administered 2021-03-11: 1 via TOPICAL

## 2021-03-11 MED ORDER — PHENYLEPHRINE 40 MCG/ML (10ML) SYRINGE FOR IV PUSH (FOR BLOOD PRESSURE SUPPORT)
PREFILLED_SYRINGE | INTRAVENOUS | Status: AC
  Filled 2021-03-11: qty 10

## 2021-03-11 MED ORDER — FENTANYL CITRATE (PF) 250 MCG/5ML IJ SOLN
INTRAMUSCULAR | Status: DC | PRN
  Administered 2021-03-11: 100 ug via INTRAVENOUS

## 2021-03-11 MED ORDER — LACTATED RINGERS IV SOLN: INTRAVENOUS | Status: DC

## 2021-03-11 MED ORDER — CEFAZOLIN SODIUM-DEXTROSE 2-4 GM/100ML-% IV SOLN
2.0000 g | Freq: Three times a day (TID) | INTRAVENOUS | Status: AC
  Administered 2021-03-11 – 2021-03-12 (×2): 2 g via INTRAVENOUS
  Filled 2021-03-11 (×2): qty 100

## 2021-03-11 MED ORDER — CEFAZOLIN SODIUM-DEXTROSE 2-4 GM/100ML-% IV SOLN
2.0000 g | INTRAVENOUS | Status: AC
  Administered 2021-03-11: 2 g via INTRAVENOUS
  Filled 2021-03-11: qty 100

## 2021-03-11 MED ORDER — EPHEDRINE SULFATE 50 MG/ML IJ SOLN
INTRAMUSCULAR | Status: DC | PRN
  Administered 2021-03-11: 5 mg via INTRAVENOUS
  Administered 2021-03-11: 10 mg via INTRAVENOUS

## 2021-03-11 MED ORDER — DAPAGLIFLOZIN PROPANEDIOL 10 MG PO TABS
10.0000 mg | ORAL_TABLET | Freq: Every day | ORAL | Status: DC
  Filled 2021-03-11 (×3): qty 1

## 2021-03-11 MED ORDER — CARVEDILOL 6.25 MG PO TABS
6.2500 mg | ORAL_TABLET | Freq: Two times a day (BID) | ORAL | Status: DC
  Administered 2021-03-12: 6.25 mg via ORAL
  Filled 2021-03-11: qty 1

## 2021-03-11 MED ORDER — ASPIRIN EC 325 MG PO TBEC
325.0000 mg | DELAYED_RELEASE_TABLET | Freq: Every day | ORAL | Status: DC
  Administered 2021-03-12: 325 mg via ORAL
  Filled 2021-03-11: qty 1

## 2021-03-11 MED ORDER — ACETAMINOPHEN 10 MG/ML IV SOLN
1000.0000 mg | Freq: Once | INTRAVENOUS | Status: DC | PRN
  Administered 2021-03-11: 1000 mg via INTRAVENOUS

## 2021-03-11 MED ORDER — LISINOPRIL 10 MG PO TABS
10.0000 mg | ORAL_TABLET | Freq: Every day | ORAL | Status: DC
  Administered 2021-03-11: 10 mg via ORAL
  Filled 2021-03-11: qty 1

## 2021-03-11 MED ORDER — LIDOCAINE 2% (20 MG/ML) 5 ML SYRINGE
INTRAMUSCULAR | Status: DC | PRN
  Administered 2021-03-11: 100 mg via INTRAVENOUS

## 2021-03-11 MED ORDER — SODIUM CHLORIDE 0.9 % IV SOLN
INTRAVENOUS | Status: DC
  Administered 2021-03-11: 10 mL/h via INTRAVENOUS

## 2021-03-11 MED ORDER — MORPHINE SULFATE (PF) 2 MG/ML IV SOLN: 2.0000 mg | INTRAVENOUS | Status: DC | PRN

## 2021-03-11 MED ORDER — FENTANYL CITRATE (PF) 100 MCG/2ML IJ SOLN
INTRAMUSCULAR | Status: AC
  Filled 2021-03-11: qty 2

## 2021-03-11 MED ORDER — SODIUM CHLORIDE 0.9 % IV SOLN
0.0125 ug/kg/min | INTRAVENOUS | Status: AC
  Administered 2021-03-11: .2 ug/kg/min via INTRAVENOUS
  Filled 2021-03-11: qty 2000

## 2021-03-11 MED ORDER — LABETALOL HCL 5 MG/ML IV SOLN: 10.0000 mg | INTRAVENOUS | Status: DC | PRN

## 2021-03-11 MED ORDER — ONDANSETRON HCL 4 MG/2ML IJ SOLN
INTRAMUSCULAR | Status: DC | PRN
  Administered 2021-03-11: 4 mg via INTRAVENOUS

## 2021-03-11 MED ORDER — GUAIFENESIN-DM 100-10 MG/5ML PO SYRP: 15.0000 mL | ORAL_SOLUTION | ORAL | Status: DC | PRN

## 2021-03-11 MED ORDER — HEPARIN 6000 UNIT IRRIGATION SOLUTION
Status: AC
  Filled 2021-03-11: qty 500

## 2021-03-11 MED ORDER — PHENYLEPHRINE HCL-NACL 20-0.9 MG/250ML-% IV SOLN
INTRAVENOUS | Status: DC | PRN
  Administered 2021-03-11: 50 ug/min via INTRAVENOUS

## 2021-03-11 MED ORDER — FENTANYL CITRATE (PF) 100 MCG/2ML IJ SOLN
25.0000 ug | INTRAMUSCULAR | Status: DC | PRN
  Administered 2021-03-11: 50 ug via INTRAVENOUS

## 2021-03-11 MED ORDER — ACETAMINOPHEN 650 MG RE SUPP: 325.0000 mg | RECTAL | Status: DC | PRN

## 2021-03-11 MED ORDER — POLYETHYLENE GLYCOL 3350 17 G PO PACK: 17.0000 g | PACK | Freq: Every day | ORAL | Status: DC | PRN

## 2021-03-11 SURGICAL SUPPLY — 46 items
BAG COUNTER SPONGE SURGICOUNT (BAG) ×2 IMPLANT
CANISTER SUCT 3000ML PPV (MISCELLANEOUS) ×2 IMPLANT
CATH ROBINSON RED A/P 18FR (CATHETERS) ×2 IMPLANT
CATH SUCT 10FR WHISTLE TIP (CATHETERS) ×2 IMPLANT
CLIP VESOCCLUDE MED 6/CT (CLIP) ×2 IMPLANT
CLIP VESOCCLUDE SM WIDE 6/CT (CLIP) ×2 IMPLANT
COVER PROBE W GEL 5X96 (DRAPES) IMPLANT
DERMABOND ADVANCED (GAUZE/BANDAGES/DRESSINGS) ×1
DERMABOND ADVANCED .7 DNX12 (GAUZE/BANDAGES/DRESSINGS) ×1 IMPLANT
DRAIN CHANNEL 15F RND FF W/TCR (WOUND CARE) IMPLANT
ELECT REM PT RETURN 9FT ADLT (ELECTROSURGICAL) ×2
ELECTRODE REM PT RTRN 9FT ADLT (ELECTROSURGICAL) ×1 IMPLANT
EVACUATOR SILICONE 100CC (DRAIN) IMPLANT
GLOVE SRG 8 PF TXTR STRL LF DI (GLOVE) ×1 IMPLANT
GLOVE SURG POLYISO LF SZ7.5 (GLOVE) ×2 IMPLANT
GLOVE SURG UNDER POLY LF SZ8 (GLOVE) ×2
GOWN STRL REUS W/ TWL LRG LVL3 (GOWN DISPOSABLE) ×2 IMPLANT
GOWN STRL REUS W/ TWL XL LVL3 (GOWN DISPOSABLE) ×1 IMPLANT
GOWN STRL REUS W/TWL LRG LVL3 (GOWN DISPOSABLE) ×4
GOWN STRL REUS W/TWL XL LVL3 (GOWN DISPOSABLE) ×2
HEMOSTAT SNOW SURGICEL 2X4 (HEMOSTASIS) IMPLANT
INSERT FOGARTY SM (MISCELLANEOUS) IMPLANT
KIT BASIN OR (CUSTOM PROCEDURE TRAY) ×2 IMPLANT
KIT SHUNT ARGYLE CAROTID ART 6 (VASCULAR PRODUCTS) IMPLANT
KIT TURNOVER KIT B (KITS) ×2 IMPLANT
NEEDLE HYPO 25GX1X1/2 BEV (NEEDLE) IMPLANT
NS IRRIG 1000ML POUR BTL (IV SOLUTION) ×6 IMPLANT
PACK CAROTID (CUSTOM PROCEDURE TRAY) ×2 IMPLANT
PAD ARMBOARD 7.5X6 YLW CONV (MISCELLANEOUS) ×4 IMPLANT
PATCH VASC XENOSURE 1CMX6CM (Vascular Products) ×2 IMPLANT
PATCH VASC XENOSURE 1X6 (Vascular Products) ×1 IMPLANT
POSITIONER HEAD DONUT 9IN (MISCELLANEOUS) ×2 IMPLANT
SET WALTER ACTIVATION W/DRAPE (SET/KITS/TRAYS/PACK) IMPLANT
SHUNT CAROTID BYPASS 10 (VASCULAR PRODUCTS) IMPLANT
SHUNT CAROTID BYPASS 12FRX15.5 (VASCULAR PRODUCTS) IMPLANT
SURGIFLO W/THROMBIN 8M KIT (HEMOSTASIS) ×2 IMPLANT
SUT ETHILON 3 0 PS 1 (SUTURE) IMPLANT
SUT PROLENE 6 0 BV (SUTURE) ×6 IMPLANT
SUT SILK 3 0 (SUTURE)
SUT SILK 3-0 18XBRD TIE 12 (SUTURE) IMPLANT
SUT VIC AB 3-0 SH 27 (SUTURE) ×4
SUT VIC AB 3-0 SH 27X BRD (SUTURE) ×2 IMPLANT
SUT VIC AB 3-0 X1 27 (SUTURE) ×2 IMPLANT
SYR CONTROL 10ML LL (SYRINGE) IMPLANT
TOWEL GREEN STERILE (TOWEL DISPOSABLE) ×2 IMPLANT
WATER STERILE IRR 1000ML POUR (IV SOLUTION) ×2 IMPLANT

## 2021-03-11 NOTE — Interval H&P Note (Signed)
History and Physical Interval Note:  03/11/2021 12:12 PM  [name] [name]  has presented today for surgery, with the diagnosis of RIGHT CAROTID ARTERY STENOSIS.  The various methods of treatment have been discussed with the patient and family. After consideration of risks, benefits and other options for treatment, the patient has consented to  Procedure(s): RIGHT CAROTID ENDARTERECTOMY (Right) as a surgical intervention.  The patient's history has been reviewed, patient examined, no change in status, stable for surgery.  I have reviewed the patient's chart and labs.  Questions were answered to the patient's satisfaction.     Annamarie Major

## 2021-03-11 NOTE — Op Note (Signed)
Patient name: [name] [name] MRN: [id number] DOB: May 23, 1961 Sex: male  03/11/2021 Pre-operative Diagnosis: symptomatic   right carotid stenosis Post-operative diagnosis:  Same Surgeon:  Annamarie Major Assistants:  Johnna Acosta Procedure:    right carotid Endarterectomy with bovine pericardial patch angioplasty Anesthesia:  General Blood Loss:  100cc Specimens:  None Findings:  90 %stenosis; Thrombus:  none  Indications:  This is a 60 year old male with history of left CEA who presented with a right occipital stroke and high grade right carotid stenosis.  He comes in today for CEA  Procedure:  The patient was identified in the holding area and taken to [facility]  The patient was then placed supine on the table.   General endotrachial anesthesia was administered.  The patient was prepped and draped in the usual sterile fashion.  A time out was called and antibiotics were administered.  An assistant was necessary to expedite the procedure and assist with technical details  The incision was made along the anterior border of the right sternocleidomastoid muscle.  Cautery was used to dissect through the subcutaneous tissue.  The platysma muscle was divided with cautery.  The internal jugular vein was exposed along its anterior medial border.  The common facial vein was exposed and then divided between 2-0 silk ties and metal clips.  The common carotid artery was then circumferentially exposed and encircled with an umbilical tape.  The vagus nerve was identified and protected.  Next sharp dissection was used to expose the external carotid artery and the superior thyroid artery.  The were encircled with a blue vessel loop and a 2-0 silk tie respectively.  Finally, the internal carotid was carefully dissected free.  An umbilical tape was placed around the internal carotid artery distal to the diseased segment.  The hypoglossal nerve was visualized throughout and protected.  The patient was given  systemic heparinization.  A bovine carotid patch was selected and prepared on the back table.  A 10 french shunt was also prepared.  After blood pressure readings were appropriate and the heparin had been given time to circulate, the internal carotid artery was occluded with a baby Gregory clamp.  The external and common carotid arteries were then occluded with vascular clamps and the 2-0 tie tightened on the superior thyroid artery.  A #11 blade was used to make an arteriotomy in the common carotid artery.  This was extended with Potts scissors along the anterior and lateral border of the common and internal carotid artery.  Approximately 90% stenosis was identified.  There was no thrombus identified.  The 10 french shunt was not placed, as there was pulsatile backbleeding.  A kleiner kuntz elevator was used to perform endarterectomy.  An eversion endarterectomy was performed in the external carotid artery.  A good distal endpoint was obtained in the internal carotid artery.  The specimen was removed and sent to pathology.  Heparinized saline was used to irrigate the endarterectomized field.  All potential embolic debris was removed.  Bovine pericardial patch angioplasty was then performed using a running 6-0 Prolene.  The common internal and external carotid arteries were all appropriately flushed. The artery was again irrigated with heparin saline.  The anastomosis was then secured. The clamp was first released on the external carotid artery followed by the common carotid artery approximately 30 seconds later, bloodflow was reestablish through the internal carotid artery.  Next, a hand-held  Doppler was used to evaluate the signals in the common,  external, and internal  carotid arteries, all of which had appropriate signals. I then administered  50 mg protamine. The wound was then irrigated.  After hemostasis was achieved, the carotid sheath was reapproximated with 3-0 Vicryl. The  platysma muscle was  reapproximated with running 3-0 Vicryl. The skin  was closed with 4-0 Vicryl. Dermabond was placed on the skin. The  patient was then successfully extubated. His neurologic exam was  similar to his preprocedural exam. The patient was then taken to recovery room  in stable condition. There were no complications.     Disposition:  To PACU in stable condition.  Relevant Operative Details:  905 stenosis at the bifurcation. A shunt was not used as there was excellent backbleeding  V. Annamarie Major, M.D., [facility] [place] Office: [phone] Pager:  [phone]

## 2021-03-11 NOTE — Anesthesia Postprocedure Evaluation (Signed)
Anesthesia Post Note  Patient: DEVVON ARNWINE  Procedure(s) Performed: RIGHT CAROTID ENDARTERECTOMY (Right: Neck) PATCH ANGIOPLASTY (Right: Neck)     Patient location during evaluation: PACU Anesthesia Type: General Level of consciousness: awake and alert Pain management: pain level controlled Vital Signs Assessment: post-procedure vital signs reviewed and stable Respiratory status: spontaneous breathing, nonlabored ventilation, respiratory function stable and patient connected to nasal cannula oxygen Cardiovascular status: blood pressure returned to baseline and stable Postop Assessment: no apparent nausea or vomiting Anesthetic complications: no   No notable events documented.  Last Vitals:  Vitals:   03/11/21 1456 03/11/21 1500  BP:  132/79  Pulse: 85 84  Resp: 15 16  Temp:    SpO2: 96% 93%    Last Pain:  Vitals:   03/11/21 1500  TempSrc:   PainSc: 4                  Barnet Glasgow

## 2021-03-11 NOTE — Anesthesia Preprocedure Evaluation (Addendum)
Anesthesia Evaluation  Patient identified by MRN, date of birth, ID band Patient awake    Reviewed: Allergy & Precautions, NPO status , Patient's Chart, lab work & pertinent test results  History of Anesthesia Complications (+) PONV  Airway Mallampati: II  TM Distance: >3 FB Neck ROM: Full    Dental no notable dental hx. (+) Teeth Intact, Dental Advisory Given   Pulmonary Current SmokerPatient did not abstain from smoking.,    Pulmonary exam normal breath sounds clear to auscultation       Cardiovascular hypertension, + angina + CAD, + Past MI and +CHF  Normal cardiovascular exam Rhythm:Regular Rate:Normal  03/09/21 Echo  1. Left ventricular ejection fraction, by estimation, is 50%. The left  ventricle has low normal function. Left ventricular endocardial border not  optimally defined to evaluate regional wall motion. The left ventricular  internal cavity size was  moderately dilated. Left ventricular diastolic parameters are consistent  with Grade I diastolic dysfunction (impaired relaxation).  2. Right ventricular systolic function is normal. The right ventricular  size is mildly enlarged. There is normal pulmonary artery systolic  pressure.  3. Left atrial size was moderately dilated.  4. The mitral valve is normal in structure. Mild mitral valve  regurgitation. No evidence of mitral stenosis.  5. The aortic valve is tricuspid. Aortic valve regurgitation is not  visualized. Mild to moderate aortic valve sclerosis/calcification is  present, without any evidence of aortic stenosis. Aortic valve area, by  VTI measures 2.20 cm. Aortic valve mean  gradient measures 4.0 mmHg. Aortic valve Vmax measures 1.29 m/s.  6. The inferior vena cava is normal in size with greater than 50%  respiratory variability, suggesting right atrial pressure of 3 mmHg.  7. Recommend limited echo with definity to assess LV Apex.     Neuro/Psych TIA   GI/Hepatic negative GI ROS, Neg liver ROS,   Endo/Other  negative endocrine ROS  Renal/GU Renal disease     Musculoskeletal negative musculoskeletal ROS (+)   Abdominal   Peds  Hematology negative hematology ROS (+) Lab Results      Component                Value               Date                      WBC                      7.4                 03/08/2021                HGB                      15.3                03/08/2021                HCT                      45.0                03/08/2021                MCV                      92.6  03/08/2021                PLT                      243                 03/08/2021              Anesthesia Other Findings   Reproductive/Obstetrics                            Anesthesia Physical Anesthesia Plan  ASA: 3  Anesthesia Plan: General   Post-op Pain Management:    Induction: Intravenous  PONV Risk Score and Plan: 3 and Treatment may vary due to age or medical condition, Ondansetron and Dexamethasone  Airway Management Planned: Oral ETT  Additional Equipment: Arterial line  Intra-op Plan:   Post-operative Plan: Extubation in OR  Informed Consent: I have reviewed the patients History and Physical, chart, labs and discussed the procedure including the risks, benefits and alternatives for the proposed anesthesia with the patient or authorized representative who has indicated his/her understanding and acceptance.     Dental advisory given  Plan Discussed with: CRNA and Anesthesiologist  Anesthesia Plan Comments:        Anesthesia Quick Evaluation

## 2021-03-11 NOTE — Transfer of Care (Signed)
Immediate Anesthesia Transfer of Care Note  Patient: [name] [name]  Procedure(s) Performed: RIGHT CAROTID ENDARTERECTOMY (Right: Neck) PATCH ANGIOPLASTY (Right: Neck)  Patient Location: PACU  Anesthesia Type:General  Level of Consciousness: awake, alert , oriented and patient cooperative  Airway & Oxygen Therapy: Patient Spontanous Breathing  Post-op Assessment: Report given to RN, Post -op Vital signs reviewed and stable, Patient moving all extremities X 4 and Patient able to stick tongue midline  Post vital signs: Reviewed and stable  Last Vitals:  Vitals Value Taken Time  BP 147/92 03/11/21 1441  Temp    Pulse 81 03/11/21 1447  Resp 22 03/11/21 1447  SpO2 96 % 03/11/21 1447  Vitals shown include unvalidated device data.  Last Pain:  Vitals:   03/11/21 1030  TempSrc:   PainSc: 0-No pain         Complications: No notable events documented.

## 2021-03-11 NOTE — Progress Notes (Signed)
Patient to 4E15 from PACU. Vital signs obtained. On monitor CCMD notified. NIH 0. Incision level 0. Alert and oriented to room and call light. Call bell within reach.  Era Bumpers, RN

## 2021-03-11 NOTE — Anesthesia Procedure Notes (Signed)
Procedure Name: Intubation Date/Time: 03/11/2021 12:37 PM Performed by: Inda Coke, CRNA Pre-anesthesia Checklist: Patient identified, Emergency Drugs available, Suction available and Patient being monitored Patient Re-evaluated:Patient Re-evaluated prior to induction Oxygen Delivery Method: Circle System Utilized Preoxygenation: Pre-oxygenation with 100% oxygen Induction Type: IV induction Ventilation: Mask ventilation without difficulty and Oral airway inserted - appropriate to patient size Laryngoscope Size: Sabra Heck and 2 Grade View: Grade I Tube type: Oral Tube size: 7.5 mm Number of attempts: 1 Airway Equipment and Method: Stylet and Oral airway Placement Confirmation: ETT inserted through vocal cords under direct vision, positive ETCO2 and breath sounds checked- equal and bilateral Secured at: 22 cm Tube secured with: Tape Dental Injury: Teeth and Oropharynx as per pre-operative assessment

## 2021-03-11 NOTE — Discharge Instructions (Addendum)
[facility]  Discharge Instructions   Carotid Endarterectomy (CEA)  Please refer to the following instructions for your post-procedure care. Your surgeon or physician assistant will discuss any changes with you.  Activity  You are encouraged to walk as much as you can. You can slowly return to normal activities but must avoid strenuous activity and heavy lifting until your doctor tell you it's OK. Avoid activities such as vacuuming or swinging a golf club. You can drive after one week if you are comfortable and you are no longer taking prescription pain medications. It is normal to feel tired for serval weeks after your surgery. It is also normal to have difficulty with sleep habits, eating, and bowel movements after surgery. These will go away with time.  When driving to the beach, stop every one hour to 1.5hrs to walk around to help prevent blood clots.   Bathing/Showering  You may shower after you come home. Do not soak in a bathtub, hot tub, or swim until the incision heals completely.  Incision Care  Shower every day. Clean your incision with mild soap and water. Pat the area dry with a clean towel. You do not need a bandage unless otherwise instructed. Do not apply any ointments or creams to your incision. You may have skin glue on your incision. Do not peel it off. It will come off on its own in about one week. Your incision may feel thickened and raised for several weeks after your surgery. This is normal and the skin will soften over time. For Men Only: It's OK to shave around the incision but do not shave the incision itself for 2 weeks. It is common to have numbness under your chin that could last for several months.  When sitting out on the beach, protect incision from sand and sun.  Do not get in pool or ocean until returning for post operative visit.   Diet  Resume your normal diet. There are no special food restrictions following this  procedure. A low fat/low cholesterol diet is recommended for all patients with vascular disease. In order to heal from your surgery, it is CRITICAL to get adequate nutrition. Your body requires vitamins, minerals, and protein. Vegetables are the best source of vitamins and minerals. Vegetables also provide the perfect balance of protein. Processed food has little nutritional value, so try to avoid this.  Medications  Resume taking all of your medications unless your doctor or physician assistant tells you not to. If your incision is causing pain, you may take over-the- counter pain relievers such as acetaminophen (Tylenol). If you were prescribed a stronger pain medication, please be aware these medications can cause nausea and constipation. Prevent nausea by taking the medication with a snack or meal. Avoid constipation by drinking plenty of fluids and eating foods with a high amount of fiber, such as fruits, vegetables, and grains. Do not take Tylenol if you are taking prescription pain medications.  Follow Up  Our office will schedule a follow up appointment 2-3 weeks following discharge.  Please call us immediately for any of the following conditions  Increased pain, redness, drainage (pus) from your incision site. Fever of 101 degrees or higher. If you should develop stroke (slurred speech, difficulty swallowing, weakness on one side of your body, loss of vision) you should call 911 and go to the nearest emergency room.  Reduce your risk of vascular disease:  Stop smoking. If you would like help call [facility]  at 1-800-QUIT-NOW (289)827-7622) or [place] at [phone]) [phone]. Manage your cholesterol Maintain a desired weight Control your diabetes Keep your blood pressure down  If you have any questions, please call the office at [phone].

## 2021-03-11 NOTE — Anesthesia Procedure Notes (Signed)
Arterial Line Insertion Performed by: Gilbert Narain B, CRNA, CRNA  Patient location: Pre-op. Preanesthetic checklist: patient identified, IV checked, site marked, risks and benefits discussed, surgical consent, monitors and equipment checked, pre-op evaluation, timeout performed and anesthesia consent Lidocaine 1% used for infiltration and patient sedated Left, radial was placed Catheter size: 20 G Hand hygiene performed , maximum sterile barriers used  and Seldinger technique used Allen's test indicative of satisfactory collateral circulation Attempts: 1 Procedure performed without using ultrasound guided technique. Following insertion, dressing applied and Biopatch. Post procedure assessment: normal  Patient tolerated the procedure well with no immediate complications.    

## 2021-03-12 LAB — CBC
HCT: 43.2 % (ref 39.0–52.0)
Hemoglobin: 15 g/dL (ref 13.0–17.0)
MCH: 31.7 pg (ref 26.0–34.0)
MCHC: 34.7 g/dL (ref 30.0–36.0)
MCV: 91.3 fL (ref 80.0–100.0)
Platelets: 246 10*3/uL (ref 150–400)
RBC: 4.73 MIL/uL (ref 4.22–5.81)
RDW: 13.2 % (ref 11.5–15.5)
WBC: 14.4 10*3/uL — ABNORMAL HIGH (ref 4.0–10.5)
nRBC: 0 % (ref 0.0–0.2)

## 2021-03-12 LAB — BASIC METABOLIC PANEL
Anion gap: 9 (ref 5–15)
BUN: 11 mg/dL (ref 6–20)
CO2: 22 mmol/L (ref 22–32)
Calcium: 8.7 mg/dL — ABNORMAL LOW (ref 8.9–10.3)
Chloride: 104 mmol/L (ref 98–111)
Creatinine, Ser: 0.98 mg/dL (ref 0.61–1.24)
GFR, Estimated: >60 mL/min (ref >60)
Glucose, Bld: 170 mg/dL — ABNORMAL HIGH (ref 70–99)
Potassium: 4 mmol/L (ref 3.5–5.1)
Sodium: 135 mmol/L (ref 135–145)

## 2021-03-12 LAB — LIPID PANEL
Cholesterol: 113 mg/dL (ref 0–200)
HDL: 31 mg/dL — ABNORMAL LOW (ref >40)
LDL Cholesterol: 67 mg/dL (ref 0–99)
Total CHOL/HDL Ratio: 3.6 RATIO
Triglycerides: 74 mg/dL (ref <150)
VLDL: 15 mg/dL (ref 0–40)

## 2021-03-12 MED ORDER — OXYCODONE-ACETAMINOPHEN 5-325 MG PO TABS: 1.0000 | ORAL_TABLET | Freq: Four times a day (QID) | ORAL | 0 refills | Status: DC | PRN

## 2021-03-12 NOTE — Progress Notes (Signed)
Patient d/c home with self care. Discharge instructions reviewed and patient and wife stated understanding. Left in personal vehicle with wife. Fuller Canada, RN

## 2021-03-12 NOTE — Progress Notes (Addendum)
  Progress Note    03/12/2021 6:57 AM 1 Day Post-Op  Subjective:  sitting on side of the bed dressed and ready to go home.  No complaints.  Has voided and walked.  No difficulty swallowing  Afebrile HR 70's-100's NSR [id number] systolic 0000000 RA  Gtts:  none  Vitals:   03/12/21 0300 03/12/21 0446  BP: (!) 159/97 (!) 166/88  Pulse:  86  Resp: 18   Temp: 97.9 F (36.6 C) 98.1 F (36.7 C)  SpO2: 96% 96%     Physical Exam: Neuro:  in tact; tongue midline Lungs:  non labored Incision:  clean without hematoma  CBC    Component Value Date/Time   WBC 14.4 (H) 03/12/2021 0433   RBC 4.73 03/12/2021 0433   HGB 15.0 03/12/2021 0433   HCT 43.2 03/12/2021 0433   PLT 246 03/12/2021 0433   MCV 91.3 03/12/2021 0433   MCH 31.7 03/12/2021 0433   MCHC 34.7 03/12/2021 0433   RDW 13.2 03/12/2021 0433   LYMPHSABS 3.8 03/08/2021 1326   MONOABS 0.6 03/08/2021 1326   EOSABS 0.1 03/08/2021 1326   BASOSABS 0.1 03/08/2021 1326    BMET    Component Value Date/Time   NA 135 03/12/2021 0433   K 4.0 03/12/2021 0433   CL 104 03/12/2021 0433   CO2 22 03/12/2021 0433   GLUCOSE 170 (H) 03/12/2021 0433   BUN 11 03/12/2021 0433   CREATININE 0.98 03/12/2021 0433   CALCIUM 8.7 (L) 03/12/2021 0433   GFRNONAA >60 03/12/2021 0433   GFRAA >60 06/21/2017 0740     Intake/Output Summary (Last 24 hours) at 03/12/2021 0657 Last data filed at 03/12/2021 0358 Gross per 24 hour  Intake 1511.67 ml  Output 2050 ml  Net -538.33 ml     Assessment/Plan:  This is a 60 y.o. male who is s/p right CEA 1 Day Post-Op  -pt is doing well this am. -pt neuro exam is in tact -pt has ambulated -pt has voided -f/u with [facility] in 2-3 weeks (appt has been made). -Dr. Trula Slade discussed with pt yesterday that he felt it was fine to recover at the beach.  Discussed with pt and wife about stopping every hour to 1.5 hrs for 5-10 minutes to walk around.   Discussed not getting in a pool or the ocean until being seen  back in our office.  Protect incision while sitting on the beach from sand and sun. -again discussed importance of smoking cessation.   Leontine Locket, PA-C [facility] [phone]  VASCULAR STAFF ADDENDUM: I have independently interviewed and examined the patient. I agree with the above.  Looking good POD#1 R CEA Follow up with [place] for wound check in 2-3 weeks. Counseled him about precautions with planned beach trip.  Yevonne Aline. Stanford Breed, MD [facility] Phone Number: [phone] 03/12/2021 9:38 AM

## 2021-03-12 NOTE — Plan of Care (Signed)
  Problem: Health Behavior/Discharge Planning: Goal: Ability to manage health-related needs will improve Outcome: Progressing   Problem: Clinical Measurements: Goal: Respiratory complications will improve Outcome: Progressing   

## 2021-03-12 NOTE — Discharge Summary (Addendum)
Discharge Summary     [name] [name] 09/23/1960 60 y.o. male  [id number]  Admission Date: 03/11/2021  Discharge Date: 03/12/2021  Physician: Dr. Trula Slade  Admission Diagnosis: Carotid stenosis, right [I65.21]   HPI:   This is a 60 y.o. male with history of left CEA who presented with a right occipital stroke and high grade right carotid stenosis.  He comes in today for CEA  Hospital Course:  The patient was admitted to the hospital and taken to the operating room on 03/11/2021 and underwent right carotid endarterectomy.    Findings: 90 %stenosis; Thrombus:  none  The pt tolerated the procedure well and was transported to the PACU in good condition.   By POD 1, the pt neuro status was in tact.  He had voided and walked.  He was not having trouble swallowing.  Dr. Trula Slade discussed with pt yesterday that he felt it was fine to recover at the beach.  Discussed with pt and wife about stopping every hour to 1.5 hrs for 5-10 minutes to walk around.   Discussed not getting in a pool or the ocean until being seen back in our office.  Protect incision while sitting on the beach from sand and sun. -again discussed importance of smoking cessation.    Recent Labs    03/11/21 2014 03/12/21 0433  NA 134* 135  K 4.0 4.0  CL 103 104  CO2 23 22  GLUCOSE 224* 170*  BUN 12 11  CALCIUM 8.6* 8.7*   Recent Labs    03/11/21 2014 03/12/21 0433  WBC 10.3 14.4*  HGB 15.1 15.0  HCT 43.0 43.2  PLT 244 246   Recent Labs    03/11/21 2014  INR 1.0     Discharge Instructions     Discharge patient   Complete by: As directed    Discharge disposition: 01-Home or Self Care   Discharge patient date: 03/12/2021       Discharge Diagnosis:  Carotid stenosis, right [I65.21]  Secondary Diagnosis: Patient Active Problem List   Diagnosis Date Noted   Carotid stenosis, right 03/11/2021   HFrEF (heart failure with reduced ejection fraction) ([place])    Stroke ([place]) 03/08/2021   Colon  cancer screening    Polyp of colon    Hypertensive emergency 04/02/2016   Acute on chronic combined systolic and diastolic CHF, NYHA class 4 ([place]) 04/02/2016   Acute kidney injury ([place]) 04/02/2016   Acute respiratory failure with hypoxia ([place]) 04/02/2016   Carotid stenosis, asymptomatic 02/09/2016   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 12/17/2015   Hypercholesterolemia 12/15/2015   Elevated troponin 12/15/2015   Coronary artery disease involving native coronary artery with unstable angina pectoris ([place]) 12/15/2015   Cardiomyopathy, ischemic 12/14/2015   Mitral regurgitation 12/14/2015   Atrial fibrillation with rapid ventricular response ([place]) 12/13/2015   Cardiomyopathy ([place]) 12/13/2015   Leukocytosis 12/13/2015   Tobacco abuse    Past Medical History:  Diagnosis Date   Acute systolic congestive heart failure ([place])    Atrial fibrillation with rapid ventricular response ([place]) 12/13/2015   "had a heart monitor and can't find a source for the a. fib"   Cardiomyopathy ([place]) 12/13/2015   Cardiomyopathy, ischemic 12/14/2015   a. EF 20-25% in 12/2015 b. 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   atorvastatin 80 MG tablet Commonly known as: LIPITOR TAKE 1 TABLET BY MOUTH DAILY.    carvedilol 6.25 MG tablet Commonly known as: COREG TAKE 1 TABLET BY MOUTH 2 TIMES DAILY WITH A MEAL.   clopidogrel 75 MG tablet Commonly known as: PLAVIX Take 1 tablet (75 mg total) by mouth daily.   Farxiga 10 MG Tabs tablet Generic drug: dapagliflozin propanediol Take 1 tablet (10 mg total) by mouth daily.   furosemide 20 MG tablet Commonly known as: LASIX Take 1 tablet by mouth every morning for the next 3 days then 1 every morning as needed edema What changed:  how much to take how to take this when to take this reasons to take this additional instructions   gabapentin 300 MG capsule Commonly known as: NEURONTIN One 2 times a day late afternoon and evening What changed:  how much to take how to take this when to take this reasons to take this additional instructions   lisinopril 10 MG tablet Commonly known as: ZESTRIL TAKE 1 TABLET BY MOUTH AT BEDTIME.   oxyCODONE-acetaminophen 5-325 MG tablet Commonly known as: Percocet Take 1 tablet by mouth every 6 (six) hours as needed for severe pain.   spironolactone 25 MG tablet Commonly known as: ALDACTONE TAKE 1/2 TABLET BY MOUTH DAILY What changed:  how much to take when to take this        [facility]  Discharge Instructions   Carotid Endarterectomy (CEA)  Please refer to the following instructions for your post-procedure care. Your surgeon or physician assistant will discuss any changes with you.  Activity  You are encouraged to walk as much as you can. You can slowly return to normal activities but must avoid strenuous activity and heavy lifting until your doctor tell you it's OK. Avoid activities such as vacuuming or swinging a golf club. You can drive after one week if you are comfortable and you are no longer taking prescription pain medications. It is normal to feel tired for serval weeks after your surgery. It is also normal to have difficulty with sleep habits,  eating, and bowel movements after surgery. These will go away with time.  When driving to the beach, stop every one hour to 1.5hrs to walk around to help prevent blood clots.   Bathing/Showering  You may shower after you come home. Do not soak in a bathtub, hot tub, or swim until the incision heals completely.  Incision Care  Shower every day. Clean your incision with mild soap and water. Pat the area dry with a clean towel. You do not need a bandage unless otherwise instructed. Do not apply any ointments or creams to your incision. You may have skin glue on your incision. Do not peel it off. It will come off on its own in about one week. Your  incision may feel thickened and raised for several weeks after your surgery. This is normal and the skin will soften over time. For Men Only: It's OK to shave around the incision but do not shave the incision itself for 2 weeks. It is common to have numbness under your chin that could last for several months.  When sitting out on the beach, protect incision from sand and sun.  Do not get in pool or ocean until returning for post operative visit.   Diet  Resume your normal diet. There are no special food restrictions following this procedure. A low fat/low cholesterol diet is recommended for all patients with vascular disease. In order to heal from your surgery, it is CRITICAL to get adequate nutrition. Your body requires vitamins, minerals, and protein. Vegetables are the best source of vitamins and minerals. Vegetables also provide the perfect balance of protein. Processed food has little nutritional value, so try to avoid this.  Medications  Resume taking all of your medications unless your doctor or physician assistant tells you not to. If your incision is causing pain, you may take over-the- counter pain relievers such as acetaminophen (Tylenol). If you were prescribed a stronger pain medication, please be aware these medications can cause nausea and  constipation. Prevent nausea by taking the medication with a snack or meal. Avoid constipation by drinking plenty of fluids and eating foods with a high amount of fiber, such as fruits, vegetables, and grains. Do not take Tylenol if you are taking prescription pain medications.  Follow Up  Our office will schedule a follow up appointment 2-3 weeks following discharge.  Please call us immediately for any of the following conditions  Increased pain, redness, drainage (pus) from your incision site. Fever of 101 degrees or higher. If you should develop stroke (slurred speech, difficulty swallowing, weakness on one side of your body, loss of vision) you should call 911 and go to the nearest emergency room.  Reduce your risk of vascular disease:  Stop smoking. If you would like help call [facility] at 1-800-QUIT-NOW [phone]) or [place] at [phone]. Manage your cholesterol Maintain a desired weight Control your diabetes Keep your blood pressure down  If you have any questions, please call the office at [phone].  Prescriptions given: 1.   Roxicet #8 No Refill  Disposition: home  Patient's condition: is Good  Follow up: 1. [facility] in 3-4 weeks.   Leontine Locket, PA-C [facility] [phone]   --- For [facility] use ---   Modified Rankin score at D/C (0-6): 1 (still has some numbness in fingers)  IV medication needed for:  1. Hypertension: No 2. Hypotension: No  Post-op Complications: No  1. Post-op CVA or TIA: No  If yes: Event classification (right eye, left eye, right cortical, left cortical, verterobasilar, other): n/a  If yes: Timing of event (intra-op, <6 hrs post-op, >=6 hrs post-op, unknown): n/a  2. CN injury: No  If yes: CN n/a injuried   3. Myocardial infarction: No  If yes: Dx by (EKG or clinical, Troponin): n/a  4.  CHF: No  5.  Dysrhythmia (new): No  6. Wound infection: No  7. Reperfusion symptoms: No  8.  Return to OR: No  If yes: return to OR for (bleeding, neurologic, other CEA incision, other): n/a  Discharge medications: Statin use:  Yes ASA use:  Yes   Beta blocker use:  Yes ACE-Inhibitor use:  Yes  ARB use:  No CCB use: No P2Y12  Antagonist use: Yes, '[x]'$  Plavix, '[ ]'$  Plasugrel, '[ ]'$  Ticlopinine, '[ ]'$  Ticagrelor, '[ ]'$  Other, '[ ]'$  No for medical reason, '[ ]'$  Non-compliant, '[ ]'$  Not-indicated Anti-coagulant use:  No, '[ ]'$  Warfarin, '[ ]'$  Rivaroxaban, '[ ]'$  Dabigatran,

## 2021-03-14 ENCOUNTER — Telehealth: Payer: Self-pay

## 2021-03-14 ENCOUNTER — Other Ambulatory Visit: Payer: Self-pay | Admitting: *Deleted

## 2021-03-14 ENCOUNTER — Encounter (HOSPITAL_COMMUNITY): Payer: Self-pay | Admitting: Surgery

## 2021-03-14 NOTE — Patient Outreach (Signed)
[facility]) Care Management  03/14/2021  [name] [name] [birth year] [id number]   Transition of care call/case closure   Referral received:03/11/21 Initial outreach:03/12/21 Insurance: [place] [employer]   Subjective: Initial successful telephone call to patient's preferred number in order to complete transition of care assessment; 2 HIPAA identifiers verified. Explained purpose of call and completed transition of care assessment.  [name] [name] states that he is doing alright . He  denies post-operative problems, says surgical incisions are unremarkable, states surgical pain well managed with prescribed medication. He is  tolerating diet, denies bowel or bladder problems.  Spouse is  assisting with his recovery.   Reviewed accessing the following [place] Benefits : Discussed patient  ongoing health issues, recent stroke history of hypertension, heart failure  he states he may be interested later,in program  now he has many follow up appointments to focus on , he is agreeable to having contact number for  [place] chronic disease management program.  His wife is assisting him with filing claims with [employer] for hospital indemnity plan, FMLA and short term disability. He uses a Company secretary outpatient pharmacy at [facility] outpatient pharmacy. Discussed cost saving on blood pressure monitor available at [facility]. Patient discussed having scales for monitoring daily weights.     Objective:  [name] [name] was hospitalized at [facility] 9/9-9/10/22 for Right Carotid Endarectomy Comorbidities include: Stroke,  Acute on chronic combined systolic diastolic heart failure, Hypertension, CABG , left CEA 2017. He was discharged to home on 03/12/21 without the need for home health services or DME.He has prior arranged outpatient Occupational therapy .    Assessment:  Patient voices good understanding of all discharge instructions.  See transition of care flowsheet for assessment  details.   Plan:  Reviewed hospital discharge diagnosis of Right carotid enterectomy  and discharge treatment plan using hospital discharge instructions, assessing medication adherence, reviewing problems requiring provider notification, and discussing the importance of follow up with surgeon, primary care provider and/or specialists as directed. Patient acknowledges having multiple follow up appointments, opthalmology, Neurology, PCP and vascular surgery, Outpatient occupational therapy.   Reviewed [place] healthy lifestyle program information to receive discounted premium for  2023   Step 1: Get  your annual physical  Step 2: Complete your health assessment  Step 3:Identify your current health status and complete the corresponding action step between July 03, 2020 and March 03, 2021.     No ongoing care management needs identified so will close case to [place] [employer] services and route successful outreach letter with [place] Management pamphlet and 24 Hour Nurse Line Magnet to [place] [facility] clinical pool to be mailed to patient's home address. Will include information on contacting Active health management program.  Thanked patient for their services to [facility].   Joylene Draft, RN, BSN  [place] Management Coordinator  [phone]- Mobile [phone]- Toll Free Main Office

## 2021-03-15 LAB — SURGICAL PATHOLOGY

## 2021-03-15 NOTE — Telephone Encounter (Signed)
I spoke with pt's wife in regards to payment for his short term disability forms. Wife states she will pay at his Post op appt on 03/28/21.

## 2021-03-16 ENCOUNTER — Encounter: Payer: Self-pay | Admitting: Family Medicine

## 2021-03-16 NOTE — Telephone Encounter (Signed)
Nurses Please do transitional phone call with the patient set him up for next week with either an 1120 appointment, 1140 appointment or 4:10 PM appointment wherever I have an opening.  Thank you  It would be perfectly fine to switch PCP to Dr. Sallee Lange thank you

## 2021-03-17 ENCOUNTER — Telehealth: Payer: Self-pay | Admitting: *Deleted

## 2021-03-17 NOTE — Telephone Encounter (Signed)
[facility]- TOC call 03/14/21- Patient has hospital follow up with Dr Nicki Reaper 03/24/21.

## 2021-03-18 DIAGNOSIS — Z0279 Encounter for issue of other medical certificate: Secondary | ICD-10-CM

## 2021-03-21 ENCOUNTER — Telehealth: Payer: Self-pay | Admitting: Student

## 2021-03-21 NOTE — Telephone Encounter (Signed)
Patient had recent CVA and endarterectomy. Was placed on plavix and ASA 325 mg qd. He was only taking ASA 81 mg 3-4 times a week before.   He is just concerned over his bruising .    Has f/u with [place] this week on 03/23/21    I will FYI M.Lenze PA-C

## 2021-03-21 NOTE — Progress Notes (Signed)
Cardiology Office Note    Date:  03/23/2021   ID:  Sallie, Maker [birth year], MRN [id number]   PCP:  Irena Reichmann   [place]  Cardiologist:  None   Advanced Practice Provider:  No care team member to display Electrophysiologist:  None   [id number]   Chief Complaint  Patient presents with  . Hospitalization Follow-up     History of Present Illness:  [name] [name] is a 60 y.o. male with history of CAD status post CABG 12/2015 LIMA to the LAD, SVG to OM and SVG to PDA, chronic combined systolic and diastolic CHF EF 20 to 33% in 2017, history of left carotid endarterectomy 2017, hypertension, HLD.  Has been unable to afford Entresto.  Patient was last seen by Bernerd Pho, PA-C 05/20/2020 at which time he was doing well without chest pain.  He was on Coreg lisinopril and spironolactone.  Patient had right occipital stroke and high-grade right carotid stenosis and underwent right carotid endarterectomy 03/12/2021.  He was sent home on Plavix and aspirin 325 mg daily.  2D echo LVEF 50% grade 1 DD aortic sclerosis.  Patient comes in with his wife. He's a Architectural technologist. He's had some residual peripheral vision problems. BP running high.  Smoking 1/2 ppd. Not willing to quit. Was put on Faxiga and he's not sure why. EF was ok and no problems with CHF. He had some ankle edema after surgery and took some lasix but he only takes it prn now. BP running high.  Past Medical History:  Diagnosis Date  . Acute systolic congestive heart failure ([place])   . Atrial fibrillation with rapid ventricular response ([place]) 12/13/2015   "had a heart monitor and can't find a source for the a. fib"  . Cardiomyopathy ([place]) 12/13/2015  . Cardiomyopathy, ischemic 12/14/2015   a. EF 20-25% in 12/2015 b. EF at 35-40% by repeat echo in 10/2016  . Coronary artery disease involving native coronary artery with unstable angina pectoris ([place]) 12/15/2015  .  Hypercholesterolemia 12/15/2015  . Mitral regurgitation 12/14/2015   moderate  . NSTEMI (non-ST elevated myocardial infarction) ([place])   . PONV (postoperative nausea and vomiting)    "sick with very first surgery but nothing since" in 1990s  . 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 Stroke ([place])   . Tobacco abuse     Past Surgical History:  Procedure Laterality Date  . CARDIAC CATHETERIZATION N/A 12/15/2015   Procedure: Right/Left Heart Cath and Coronary Angiography;  Surgeon: Wellington Hampshire, MD;  Location: [facility];  Service: Cardiovascular;  Laterality: N/A;  . CLIPPING OF ATRIAL APPENDAGE Left 12/17/2015   Procedure: CLIPPING OF ATRIAL APPENDAGE;  Surgeon: [name], MD;  Location: [facility];  Service: Open Heart Surgery;  Laterality: Left;  . COLONOSCOPY WITH PROPOFOL N/A 06/21/2016   Procedure: COLONOSCOPY WITH PROPOFOL;  Surgeon: Manus Gunning, MD;  Location: [facility];  Service: Gastroenterology;  Laterality: N/A;  . CORONARY ARTERY BYPASS GRAFT N/A 12/17/2015   Procedure: CORONARY ARTERY BYPASS GRAFTING (CABG) x three , using left internal mammary artery and right leg greater saphenous vein;  Surgeon: [name], MD;  Location: [facility];  Service: Open Heart Surgery;  Laterality: N/A;  . ENDARTERECTOMY Left 02/09/2016   Procedure: LEFT CAROTID ENDARTERECTOMY;  Surgeon: Durene Fruits  Pierre Bali, MD;  Location: [facility];  Service: Vascular;  Laterality: Left;  . ENDARTERECTOMY Right 03/11/2021   Procedure: RIGHT CAROTID ENDARTERECTOMY;  Surgeon: Serafina Mitchell, MD;  Location: [facility];  Service: Vascular;  Laterality: Right;  . KNEE ARTHROSCOPY Right 1991  . PATCH ANGIOPLASTY Left 02/09/2016   Procedure: PATCH ANGIOPLASTY USING Rueben Bash BIOLOGIC PATCH;  Surgeon: Serafina Mitchell, MD;  Location: [facility];  Service: Vascular;  Laterality: Left;  . PATCH ANGIOPLASTY Right 03/11/2021    Procedure: PATCH ANGIOPLASTY;  Surgeon: Serafina Mitchell, MD;  Location: [facility];  Service: Vascular;  Laterality: Right;    Current Medications: Current Meds  Medication Sig  . aspirin 325 MG EC tablet Take 1 tablet (325 mg total) by mouth daily.  Marland Kitchen atorvastatin (LIPITOR) 80 MG tablet TAKE 1 TABLET BY MOUTH DAILY.  . carvedilol (COREG) 12.5 MG tablet Take 1 tablet (12.5 mg total) by mouth 2 (two) times daily.  . furosemide (LASIX) 20 MG tablet Take 1 tablet by mouth every morning for the next 3 days then 1 every morning as needed edema (Patient taking differently: Take 20 mg by mouth daily as needed for fluid or edema.)  . gabapentin (NEURONTIN) 300 MG capsule One 2 times a day late afternoon and evening  . lisinopril (ZESTRIL) 10 MG tablet TAKE 1 TABLET BY MOUTH AT BEDTIME.  Marland Kitchen oxyCODONE-acetaminophen (PERCOCET) 5-325 MG tablet Take 1 tablet by mouth every 6 (six) hours as needed for severe pain.  Marland Kitchen spironolactone (ALDACTONE) 25 MG tablet TAKE 1/2 TABLET BY MOUTH DAILY (Patient taking differently: Take 25 mg by mouth every other day.)  . [DISCONTINUED] carvedilol (COREG) 6.25 MG tablet TAKE 1 TABLET BY MOUTH 2 TIMES DAILY WITH A MEAL.  . [DISCONTINUED] clopidogrel (PLAVIX) 75 MG tablet Take 1 tablet (75 mg total) by mouth daily.  . [DISCONTINUED] dapagliflozin propanediol (FARXIGA) 10 MG TABS tablet Take 1 tablet (10 mg total) by mouth daily.     Allergies:   Patient has no known allergies.   Social History   Socioeconomic History  . Marital status: Married    Spouse name: Not on file  . Number of children: 2  . Years of education: Not on file  . Highest education level: Not on file  Occupational History  . Occupation: Charity fundraiser: [employer]    Comment: [employer]  Tobacco Use  . Smoking status: Some Days    Packs/day: 1.00    Years: 35.00    Pack years: 35.00    Types: Cigarettes    Last attempt to quit: 12/13/2015    Years since quitting: 5.2  . Smokeless tobacco:  Former    Quit date: 12/13/2015  Vaping Use  . Vaping Use: Never used  Substance and Sexual Activity  . Alcohol use: Not Currently    Comment: rare  . Drug use: No  . Sexual activity: Yes  Other Topics Concern  . Not on file  Social History Narrative  . Not on file   Social Determinants of Health   Financial Resource Strain: Not on file  Food Insecurity: Not on file  Transportation Needs: Not on file  Physical Activity: Not on file  Stress: Not on file  Social Connections: Not on file     Family History:  The patient's  family history includes Atrial fibrillation in his father; Heart attack in his paternal grandfather; Heart failure in his father.   ROS:   Please see the history of present  illness.    ROS All other systems reviewed and are negative.   PHYSICAL EXAM:   VS:  BP (!) 150/98   Pulse 80   Ht 6' (1.829 m)   Wt 224 lb (101.6 kg)   SpO2 100%   BMI 30.38 kg/m   Physical Exam  GEN: Well nourished, well developed, in no acute distress  Neck: carotid incision healing well. no JVD, carotid bruits, or masses Cardiac:RRR; no murmurs, rubs, or gallops  Respiratory:  decreased breath sounds but clear to auscultation bilaterally, normal work of breathing GI: soft, nontender, nondistended, + BS Ext: without cyanosis, clubbing, or edema, Good distal pulses bilaterally Neuro:  Alert and Oriented x 3 Psych: euthymic mood, full affect  Wt Readings from Last 3 Encounters:  03/23/21 224 lb (101.6 kg)  03/11/21 227 lb (103 kg)  03/03/21 227 lb (103 kg)      Studies/Labs Reviewed:   EKG:  EKG is not ordered today.  The ekg reviewed from 03/08/21 NSR with old inf MI ant MI, no change  Recent Labs: 02/01/2021: B Natriuretic Peptide 90.0 03/11/2021: ALT 23 03/12/2021: BUN 11; Creatinine, Ser 0.98; Hemoglobin 15.0; Platelets 246; Potassium 4.0; Sodium 135   Lipid Panel    Component Value Date/Time   CHOL 113 03/12/2021 0433   TRIG 74 03/12/2021 0433   HDL 31 (L)  03/12/2021 0433   CHOLHDL 3.6 03/12/2021 0433   VLDL 15 03/12/2021 0433   LDLCALC 67 03/12/2021 0433    Additional studies/ records that were reviewed today include:  2D echo 9/7/2022IMPRESSIONS     1. Left ventricular ejection fraction, by estimation, is 50%. The left  ventricle has low normal function. Left ventricular endocardial border not  optimally defined to evaluate regional wall motion. The left ventricular  internal cavity size was  moderately dilated. Left ventricular diastolic parameters are consistent  with Grade I diastolic dysfunction (impaired relaxation).   2. Right ventricular systolic function is normal. The right ventricular  size is mildly enlarged. There is normal pulmonary artery systolic  pressure.   3. Left atrial size was moderately dilated.   4. The mitral valve is normal in structure. Mild mitral valve  regurgitation. No evidence of mitral stenosis.   5. The aortic valve is tricuspid. Aortic valve regurgitation is not  visualized. Mild to moderate aortic valve sclerosis/calcification is  present, without any evidence of aortic stenosis. Aortic valve area, by  VTI measures 2.20 cm. Aortic valve mean  gradient measures 4.0 mmHg. Aortic valve Vmax measures 1.29 m/s.   6. The inferior vena cava is normal in size with greater than 50%  respiratory variability, suggesting right atrial pressure of 3 mmHg.   7. Recommend limited echo with definity to assess LV Apex. Conclusion(s)/Recommendation(s): No intracardiac source of embolism  detected on this transthoracic study. A transesophageal echocardiogram is  recommended to exclude cardiac source of embolism if clinically indicated.    Risk Assessment/Calculations:         ASSESSMENT:    1. History of CVA (cerebrovascular accident)   2. Coronary artery disease involving native coronary artery of native heart without angina pectoris   3. Ischemic cardiomyopathy   4. Chronic combined systolic and  diastolic congestive heart failure ([employer])   5. Essential hypertension   6. Hyperlipidemia LDL goal <70   7. Tobacco abuse      PLAN:  In order of problems listed above:  CVA 03/2021 followed by right carotid endarterectomy now on Plavix and aspirin 325 mg  daily.  We will place 2-week monitor to rule out atrial fibrillation but he did not have any while in the hospital.  CAD status post CABG 12/2015 with LIMA to the LAD SVG to OM and SVG to PDA no angina  Ischemic cardiomyopathy ejection fraction 50% on echo 03/09/2021.  Could not afford Entresto.  Was placed on Farxiga in the hospital.  We will stop this since his EF has been normal, he is not diabetic and he does not want to take this with a normal EF.  CVA 03/2021 followed by right carotid endarterectomy now on Plavix and aspirin 325 mg daily.  We will place 2-week monitor to rule out atrial fibrillation but he did not have any while in the hospital.  Hypertension blood pressure running high.  Increase carvedilol to 12.5 mg twice daily  HLD continue Lipitor  Tobacco abuse smoking 1/2 pack/day.  Offered nicotine patches but patient is unwilling to quit at this time.  Smoking cessation discussed.  Shared Decision Making/Informed Consent        Medication Adjustments/Labs and Tests Ordered: Current medicines are reviewed at length with the patient today.  Concerns regarding medicines are outlined above.  Medication changes, Labs and Tests ordered today are listed in the Patient Instructions below. Patient Instructions  Medication Instructions:  Your physician has recommended you make the following change in your medication:  STOP Farxiga INCREASE Carvedilol to 12.5 mg tablets twice daily  *If you need a refill on your cardiac medications before your next appointment, please call your pharmacy*   Lab Work: None If you have labs (blood work) drawn today and your tests are completely normal, you will receive your results only  by: [place] (if you have MyChart) OR A paper copy in the mail If you have any lab test that is abnormal or we need to change your treatment, we will call you to review the results.   Testing/Procedures: NOne   Follow-Up: At [facility], you and your health needs are our priority.  As part of our continuing mission to provide you with exceptional heart care, we have created designated Provider Care Teams.  These Care Teams include your primary Cardiologist (physician) and Advanced Practice Providers (APPs -  Physician Assistants and Nurse Practitioners) who all work together to provide you with the care you need, when you need it.  We recommend signing up for the patient portal called "MyChart".  Sign up information is provided on this After Visit Summary.  MyChart is used to connect with patients for Virtual Visits (Telemedicine).  Patients are able to view lab/test results, encounter notes, upcoming appointments, etc.  Non-urgent messages can be sent to your provider as well.   To learn more about what you can do with MyChart, go to [email or web address].    Your next appointment:   2-3 month(s)  The format for your next appointment:   In Person  Provider:   ESTABLISH WITH MD   Other Instructions ZIO XT- Long Term Monitor Instructions   Your physician has requested you wear your ZIO patch monitor____14___days.   This is a single patch monitor.  [employer] supplies one patch monitor per enrollment.  Additional stickers are not available.   Please do not apply patch if you will be having a Nuclear Stress Test, Echocardiogram, Cardiac CT, MRI, or Chest Xray during the time frame you would be wearing the monitor. The patch cannot be worn during these tests.  You cannot remove and re-apply the ZIO XT  patch monitor.   Your ZIO patch monitor will be sent USPS Priority mail from [facility] directly to your home address. The monitor may also be mailed to a PO BOX if  home delivery is not available.   It may take 3-5 days to receive your monitor after you have been enrolled.   Once you have received you monitor, please review enclosed instructions.  Your monitor has already been registered assigning a specific monitor serial # to you.   Applying the monitor   Shave hair from upper left chest.   Hold abrader disc by orange tab.  Rub abrader in 40 strokes over left upper chest as indicated in your monitor instructions.   Clean area with 4 enclosed alcohol pads .  Use all pads to assure are is cleaned thoroughly.  Let dry.   Apply patch as indicated in monitor instructions.  Patch will be place under collarbone on left side of chest with arrow pointing upward.   Rub patch adhesive wings for 2 minutes.Remove white label marked "1".  Remove white label marked "2".  Rub patch adhesive wings for 2 additional minutes.   While looking in a mirror, press and release button in center of patch.  A small green light will flash 3-4 times .  This will be your only indicator the monitor has been turned on.     Do not shower for the first 24 hours.  You may shower after the first 24 hours.   Press button if you feel a symptom. You will hear a small click.  Record Date, Time and Symptom in the Patient Log Book.   When you are ready to remove patch, follow instructions on last 2 pages of Patient Log Book.  Stick patch monitor onto last page of Patient Log Book.   Place Patient Log Book in [place] box.  Use locking tab on box and tape box closed securely.  The [employer] and [employer] has [employer] on it.  Please place in mailbox as soon as possible.  Your physician should have your test results approximately 7 days after the monitor has been mailed back to [facility]   Call [place] at [phone] if you have questions regarding your ZIO XT patch monitor.  Call them immediately if you see an orange light blinking on your monitor.   If your  monitor falls off in less than 4 days contact our Monitor department at [phone].  If your monitor becomes loose or falls off after 4 days call [employer] at [phone] for suggestions on securing your monitor.     Signed, Ermalinda Barrios, PA-C  03/23/2021 1:49 PM    [facility] [address], [place], [place]  [address] Phone: [phone]; Fax: [phone]

## 2021-03-21 NOTE — Telephone Encounter (Signed)
New message    Patient had stroke on 03/05/21 and they put him on   clopidogrel (PLAVIX) 75 MG tablet Take 1 tablet (75 mg total) by mouth daily.   dapagliflozin propanediol (FARXIGA) 10 MG TABS tablet  aspirin 325 MG EC tablet  Please advise on adjusting medication he is having a lot of bruising

## 2021-03-21 NOTE — Telephone Encounter (Signed)
I relayed to patient that he needs to remain on his meds because of recent stroke. He agrees.

## 2021-03-23 ENCOUNTER — Ambulatory Visit (INDEPENDENT_AMBULATORY_CARE_PROVIDER_SITE_OTHER): Payer: 59

## 2021-03-23 ENCOUNTER — Other Ambulatory Visit: Payer: Self-pay | Admitting: Physician Assistant

## 2021-03-23 ENCOUNTER — Encounter (HOSPITAL_COMMUNITY): Payer: Self-pay | Admitting: Occupational Therapy

## 2021-03-23 ENCOUNTER — Encounter: Payer: Self-pay | Admitting: Physician Assistant

## 2021-03-23 ENCOUNTER — Other Ambulatory Visit: Payer: Self-pay

## 2021-03-23 ENCOUNTER — Ambulatory Visit (HOSPITAL_COMMUNITY): Payer: 59 | Attending: Family Medicine | Admitting: Occupational Therapy

## 2021-03-23 ENCOUNTER — Ambulatory Visit (INDEPENDENT_AMBULATORY_CARE_PROVIDER_SITE_OTHER): Payer: 59 | Admitting: Physician Assistant

## 2021-03-23 ENCOUNTER — Other Ambulatory Visit (HOSPITAL_COMMUNITY): Payer: Self-pay

## 2021-03-23 VITALS — BP 150/98 | HR 80 | Ht 72.0 in | Wt 224.0 lb

## 2021-03-23 DIAGNOSIS — R278 Other lack of coordination: Secondary | ICD-10-CM | POA: Diagnosis not present

## 2021-03-23 DIAGNOSIS — I10 Essential (primary) hypertension: Secondary | ICD-10-CM

## 2021-03-23 DIAGNOSIS — I4891 Unspecified atrial fibrillation: Secondary | ICD-10-CM

## 2021-03-23 DIAGNOSIS — I255 Ischemic cardiomyopathy: Secondary | ICD-10-CM | POA: Diagnosis not present

## 2021-03-23 DIAGNOSIS — I2510 Atherosclerotic heart disease of native coronary artery without angina pectoris: Secondary | ICD-10-CM | POA: Diagnosis not present

## 2021-03-23 DIAGNOSIS — Z8673 Personal history of transient ischemic attack (TIA), and cerebral infarction without residual deficits: Secondary | ICD-10-CM

## 2021-03-23 DIAGNOSIS — Z720 Tobacco use: Secondary | ICD-10-CM

## 2021-03-23 DIAGNOSIS — E785 Hyperlipidemia, unspecified: Secondary | ICD-10-CM

## 2021-03-23 DIAGNOSIS — I5042 Chronic combined systolic (congestive) and diastolic (congestive) heart failure: Secondary | ICD-10-CM | POA: Diagnosis not present

## 2021-03-23 MED ORDER — CLOPIDOGREL BISULFATE 75 MG PO TABS
75.0000 mg | ORAL_TABLET | Freq: Every day | ORAL | 1 refills | Status: DC
  Filled 2021-03-23: qty 30, 30d supply, fill #0

## 2021-03-23 MED ORDER — CARVEDILOL 12.5 MG PO TABS
12.5000 mg | ORAL_TABLET | Freq: Two times a day (BID) | ORAL | 3 refills | Status: DC
  Filled 2021-03-23: qty 180, 90d supply, fill #0
  Filled 2021-10-02: qty 180, 90d supply, fill #1
  Filled 2022-01-15: qty 180, 90d supply, fill #2

## 2021-03-23 NOTE — Patient Instructions (Signed)
Medication Instructions:  Your physician has recommended you make the following change in your medication:  STOP Farxiga INCREASE Carvedilol to 12.5 mg tablets twice daily  *If you need a refill on your cardiac medications before your next appointment, please call your pharmacy*   Lab Work: None If you have labs (blood work) drawn today and your tests are completely normal, you will receive your results only by: [place] (if you have MyChart) OR A paper copy in the mail If you have any lab test that is abnormal or we need to change your treatment, we will call you to review the results.   Testing/Procedures: NOne   Follow-Up: At [facility], you and your health needs are our priority.  As part of our continuing mission to provide you with exceptional heart care, we have created designated Provider Care Teams.  These Care Teams include your primary Cardiologist (physician) and Advanced Practice Providers (APPs -  Physician Assistants and Nurse Practitioners) who all work together to provide you with the care you need, when you need it.  We recommend signing up for the patient portal called "MyChart".  Sign up information is provided on this After Visit Summary.  MyChart is used to connect with patients for Virtual Visits (Telemedicine).  Patients are able to view lab/test results, encounter notes, upcoming appointments, etc.  Non-urgent messages can be sent to your provider as well.   To learn more about what you can do with MyChart, go to [email or web address].    Your next appointment:   2-3 month(s)  The format for your next appointment:   In Person  Provider:   ESTABLISH WITH MD   Other Instructions ZIO XT- Long Term Monitor Instructions   Your physician has requested you wear your ZIO patch monitor____14___days.   This is a single patch monitor.  [employer] supplies one patch monitor per enrollment.  Additional stickers are not available.   Please do not apply  patch if you will be having a Nuclear Stress Test, Echocardiogram, Cardiac CT, MRI, or Chest Xray during the time frame you would be wearing the monitor. The patch cannot be worn during these tests.  You cannot remove and re-apply the ZIO XT patch monitor.   Your ZIO patch monitor will be sent USPS Priority mail from [facility] directly to your home address. The monitor may also be mailed to a PO BOX if home delivery is not available.   It may take 3-5 days to receive your monitor after you have been enrolled.   Once you have received you monitor, please review enclosed instructions.  Your monitor has already been registered assigning a specific monitor serial # to you.   Applying the monitor   Shave hair from upper left chest.   Hold abrader disc by orange tab.  Rub abrader in 40 strokes over left upper chest as indicated in your monitor instructions.   Clean area with 4 enclosed alcohol pads .  Use all pads to assure are is cleaned thoroughly.  Let dry.   Apply patch as indicated in monitor instructions.  Patch will be place under collarbone on left side of chest with arrow pointing upward.   Rub patch adhesive wings for 2 minutes.Remove white label marked "1".  Remove white label marked "2".  Rub patch adhesive wings for 2 additional minutes.   While looking in a mirror, press and release button in center of patch.  A small green light will flash 3-4 times .  This  will be your only indicator the monitor has been turned on.     Do not shower for the first 24 hours.  You may shower after the first 24 hours.   Press button if you feel a symptom. You will hear a small click.  Record Date, Time and Symptom in the Patient Log Book.   When you are ready to remove patch, follow instructions on last 2 pages of Patient Log Book.  Stick patch monitor onto last page of Patient Log Book.   Place Patient Log Book in [place] box.  Use locking tab on box and tape box closed securely.  The Orange  and [employer] has [employer] on it.  Please place in mailbox as soon as possible.  Your physician should have your test results approximately 7 days after the monitor has been mailed back to [facility].   Call [place] at [phone] if you have questions regarding your ZIO XT patch monitor.  Call them immediately if you see an orange light blinking on your monitor.   If your monitor falls off in less than 4 days contact our Monitor department at [phone].  If your monitor becomes loose or falls off after 4 days call [employer] at [phone] for suggestions on securing your monitor.

## 2021-03-23 NOTE — Therapy (Signed)
[place], [place], [address] Phone: [phone]   Fax:  [phone]  Occupational Therapy Evaluation  Patient Details  Name: [name] [name] MRN: [id number] Date of Birth: 09/21/1960 Referring Provider (OT): Dr. Yehuda Savannah   Encounter Date: 03/23/2021   OT End of Session - 03/23/21 1542     Visit Number 1    Number of Visits 1    Date for OT Re-Evaluation 03/25/21    Authorization Type [employer], $20 copay    Authorization Time Period medical necessity review after 25th visit    Authorization - Visit Number 1    Authorization - Number of Visits 25    OT Start Time 0388    OT Stop Time 1535    OT Time Calculation (min) 20 min    Activity Tolerance Patient tolerated treatment well    Behavior During Therapy [facility] for tasks assessed/performed             Past Medical History:  Diagnosis Date   Acute systolic congestive heart failure ([place])    Atrial fibrillation with rapid ventricular response ([place]) 12/13/2015   "had a heart monitor and can't find a source for the a. fib"   Cardiomyopathy ([place]) 12/13/2015   Cardiomyopathy, ischemic 12/14/2015   a. EF 20-25% in 12/2015 b. 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 12/17/2015   LIMA to LAD, SVG to OM, SVG to PDA, EVH via right thigh with clipping of LA appendage   Stroke ([place])    Tobacco abuse     Past Surgical History:  Procedure Laterality Date   CARDIAC CATHETERIZATION N/A 12/15/2015   Procedure: Right/Left Heart Cath and Coronary Angiography;  Surgeon: Wellington Hampshire, MD;  Location: [place] [facility];  Service: Cardiovascular;  Laterality: N/A;   CLIPPING OF ATRIAL APPENDAGE Left 12/17/2015   Procedure: CLIPPING OF ATRIAL APPENDAGE;  Surgeon: [name], MD;  Location: [place];  Service: Open Heart Surgery;  Laterality: Left;   COLONOSCOPY WITH PROPOFOL N/A 06/21/2016   Procedure: COLONOSCOPY WITH PROPOFOL;  Surgeon: Manus Gunning, MD;  Location: [facility];  Service: Gastroenterology;  Laterality: N/A;   CORONARY ARTERY BYPASS GRAFT N/A 12/17/2015   Procedure: CORONARY ARTERY BYPASS GRAFTING (CABG) x three , using left internal mammary artery and right leg greater saphenous vein;  Surgeon: [name], MD;  Location: [place];  Service: Open Heart Surgery;  Laterality: N/A;   ENDARTERECTOMY Left 02/09/2016   Procedure: LEFT CAROTID ENDARTERECTOMY;  Surgeon: Serafina Mitchell, MD;  Location: [facility];  Service: Vascular;  Laterality: Left;   ENDARTERECTOMY Right 03/11/2021   Procedure: RIGHT CAROTID ENDARTERECTOMY;  Surgeon: Serafina Mitchell, MD;  Location: [place];  Service: Vascular;  Laterality: Right;   KNEE ARTHROSCOPY Right 1991   PATCH ANGIOPLASTY Left 02/09/2016   Procedure: PATCH ANGIOPLASTY USING Rueben Bash BIOLOGIC PATCH;  Surgeon: Serafina Mitchell, MD;  Location: [place];  Service: Vascular;  Laterality: Left;   PATCH ANGIOPLASTY Right 03/11/2021   Procedure: PATCH ANGIOPLASTY;  Surgeon: Serafina Mitchell, MD;  Location: [facility];  Service: Vascular;  Laterality: Right;    There were no vitals filed for this visit.   Subjective Assessment - 03/23/21 1538     Subjective  S: I haven't been having trouble with anything.    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 Pt reports he was off the week prior to labor day and didn't notice any symptoms until he went back to work and had difficulty writing something in a log. Pt reports he went to the opthamologist and has lost left lower quadrant vision in both eyes, does  not notice any difference in vision. Pt was referred to occupational therapy for evaluation and treatment by Dr. Yehuda Savannah.    Patient Stated Goals To go back to work.    Currently in Pain? No/denies               [facility] OT Assessment - 03/23/21 1512       Assessment   Medical Diagnosis s/p right CVA-occipital lobe    Referring Provider (OT) Dr. 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 Writing is improving.      Written Expression   Dominant Hand Left      Cognition   Overall Cognitive Status Within Functional Limits for tasks assessed      Coordination   Gross Motor Movements are Fluid and Coordinated Yes    Fine Motor Movements are Fluid and Coordinated Yes    9 Hole Peg Test Right;Left    Right 9 Hole Peg Test 20.00"    Left 9 Hole Peg Test 26.45"      ROM / Strength   AROM / PROM / Strength Strength      Strength   Strength Assessment Site Hand    Right/Left hand Right;Left    Right Hand Gross Grasp Functional    Right Hand Grip (lbs) 125    Right Hand Lateral Pinch 22 lbs    Right Hand 3 Point Pinch 20 lbs    Left Hand Gross Grasp Functional    Left Hand Grip (lbs) 125    Left Hand Lateral Pinch 22 lbs    Left Hand 3 Point Pinch 21 lbs                              OT Education - 03/23/21 1541     Education Details provided fine motor task handout for home practice    Person(s) Educated Patient    Methods  Explanation;Handout    Comprehension Verbalized understanding              OT Short Term Goals - 03/23/21 1545       OT SHORT  TERM GOAL #1   Title Pt will be provided with and educated on HEP for fine motor coordination.    Time 1    Period Days    Status Achieved    Target Date 03/23/21                      Plan - 03/23/21 1543     Clinical Impression Statement A: Pt is a 60 y/o male s/p right occipital lobe CVA on approximately 03/08/21, presenting for evaluation after acute recommendation. Pt presents with very mild coordination deficit in the form of delay on formal testing. Pt is performing all ADL activities and HEP from hospital without difficulty. Educated pt on recovery of motor planning and practicing fine motor tasks and daily tasks. Pt verbalized understanding. No further OT follow up needed.    OT Occupational Profile and History Problem Focused Assessment - Including review of records relating to presenting problem    Occupational performance deficits (Please refer to evaluation for details): Work    Marketing executive / Function / Physical Skills [facility]    Rehab Potential Excellent    Clinical Decision Making Limited treatment options, no task modification necessary    Comorbidities Affecting Occupational Performance: None    Modification or Assistance to Complete Evaluation  No modification of tasks or assist necessary to complete eval    OT Frequency One time visit    OT Treatment/Interventions Patient/family education    Plan P: Pt educated on fine motor HEP. No further OT services necessary at this time.    OT Home Exercise Plan eval: fine motor coordination work    Oncologist with Plan of Care Patient             Patient will benefit from skilled therapeutic intervention in order to improve the following deficits and impairments:   Body Structure / Function / Physical Skills: [facility]       Visit Diagnosis: Other lack of  coordination    Problem List Patient Active Problem List   Diagnosis Date Noted   Carotid stenosis, right 03/11/2021   HFrEF (heart failure with reduced ejection fraction) ([place])    Stroke ([place]) 03/08/2021   Colon cancer screening    Polyp of colon    Hypertensive emergency 04/02/2016   Acute on chronic combined systolic and diastolic CHF, NYHA class 4 ([place]) 04/02/2016   Acute kidney injury ([place]) 04/02/2016   Acute respiratory failure with hypoxia ([place]) 04/02/2016   Carotid stenosis, asymptomatic 02/09/2016   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 12/17/2015   Hypercholesterolemia 12/15/2015   Elevated troponin 12/15/2015   Coronary artery disease involving native coronary artery with unstable angina pectoris ([place]) 12/15/2015   Cardiomyopathy, ischemic 12/14/2015   Mitral regurgitation 12/14/2015   Atrial fibrillation with rapid ventricular response ([place]) 12/13/2015   Cardiomyopathy ([place]) 12/13/2015   Leukocytosis 12/13/2015   Tobacco abuse     Guadelupe Sabin, OTR/L  [phone] 03/23/2021, 3:46 PM  [place] [address] [place], [place], [address] Phone: [phone]   Fax:  [phone]  Name: [name] [name] MRN: [id number] Date of Birth: [birth year]

## 2021-03-24 ENCOUNTER — Encounter: Payer: Self-pay | Admitting: Family Medicine

## 2021-03-24 ENCOUNTER — Ambulatory Visit (INDEPENDENT_AMBULATORY_CARE_PROVIDER_SITE_OTHER): Payer: 59 | Admitting: Family Medicine

## 2021-03-24 VITALS — BP 132/84 | HR 74 | Temp 97.2°F | Wt 222.0 lb

## 2021-03-24 DIAGNOSIS — I25708 Atherosclerosis of coronary artery bypass graft(s), unspecified, with other forms of angina pectoris: Secondary | ICD-10-CM | POA: Diagnosis not present

## 2021-03-24 DIAGNOSIS — I739 Peripheral vascular disease, unspecified: Secondary | ICD-10-CM | POA: Diagnosis not present

## 2021-03-24 DIAGNOSIS — Z8673 Personal history of transient ischemic attack (TIA), and cerebral infarction without residual deficits: Secondary | ICD-10-CM | POA: Diagnosis not present

## 2021-03-24 NOTE — Progress Notes (Signed)
   Subjective:    Patient ID: [name] [name], male    DOB: Aug 19, 1960, 59 y.o.   MRN: [id number]  HPI Pt went to [facility] on 03/08/21 and 03/11/21. Pt states he was carried to [facility] by wife because he wasn't sure if something was wrong with eyes or what was going on with hand coordination. CT scan, MRI, Echo, Doppler on first visit. 2nd visit (03/11/21) had surgery.  I reviewed over his lab work his scans as well as his hospital notes. Patient had right carotid endarterectomy less than 14 days ago Patient also had a stroke but he is recovered almost fully he does have some peripheral vision deficits in the left lower visual field Review of Systems     Objective:   Physical Exam  Lungs clear heart regular pulse normal extremities no edema strength is good Cognitive seems normal     Assessment & Plan:  Currently cardiology is doing prolonged telemetry to make sure he does not have intermittent atrial fibrillation  Continue medication that specialist has him on  Blood pressure overall good control  I encourage patient to work hard at quitting smoking follow-up again in 3 months  Follow-up with neurology and cardiology as planned  History of heart disease doing well on current medications  Peripheral vascular disease very important for patient to quit smoking continue cholesterol medicine keep blood pressure under good control and continue antiplatelet medicine  Lipid profile recommended If any signs of strokes immediately connect with [place] ER etc.

## 2021-03-24 NOTE — Patient Instructions (Signed)
Please check your sugars  Both morning and evening occasionally  Please send me a readings in 2 to 3 weeks

## 2021-03-28 ENCOUNTER — Ambulatory Visit (INDEPENDENT_AMBULATORY_CARE_PROVIDER_SITE_OTHER): Payer: 59 | Admitting: Physician Assistant

## 2021-03-28 ENCOUNTER — Other Ambulatory Visit: Payer: Self-pay

## 2021-03-28 VITALS — BP 199/111 | HR 75 | Temp 97.7°F | Resp 20 | Ht 72.0 in | Wt 225.3 lb

## 2021-03-28 DIAGNOSIS — Z9889 Other specified postprocedural states: Secondary | ICD-10-CM

## 2021-03-28 NOTE — Progress Notes (Signed)
POST OPERATIVE OFFICE NOTE    CC:  F/u for surgery  HPI:  This is a 60 y.o. male who is s/p left CEA on 03/11/2021 by Dr. Trula Slade.    He presented to the hospital with stroke.  He started having some numbness in his left forearm and fingers that lasted about 15 minutes and improved.  This did happen several times.  He states that he also had a terrible right sided headache, which did cause some blurry vision and this improved when the headache improved.  He states he also had some numbness around his ear, but this has also improved.  He states that he went back to work on Tuesday and was trying to write numbers and could not write them but he is not sure if this was due to the numbness in his fingers or visual changes.  He denies any other stroke sx.  His wife Angela Nevin used to work in our office.  She did call our office and given his symptoms, it was felt that he come to the Emergency room.  Workup reveals 70-80% right ICA stenosis on CTA and 80-99% stenosis of right ICA on duplex.  The left is at 1-39%.    Pt returns today for follow up.  Pt states he is doing well.  He states that his neurologic sx have resolved.  He went to PT and was released the same day.  He is wearing a cardiac monitor to assess for possible afib.  They cardiologist have also adjusted his medication for blood pressure.  His blood pressure is elevated today.  He states he takes his pressure at home and it normally runs in the 130's.  He states it was high (536'U systolic) at his previous appt and by the time he left, it was in the 130's.  He has f/u with neurology next month.   No Known Allergies  Current Outpatient Medications  Medication Sig Dispense Refill   aspirin 325 MG EC tablet Take 1 tablet (325 mg total) by mouth daily. 30 tablet 1   atorvastatin (LIPITOR) 80 MG tablet TAKE 1 TABLET BY MOUTH DAILY. 90 tablet 3   carvedilol (COREG) 12.5 MG tablet Take 1 tablet (12.5 mg total) by mouth 2 (two) times daily. 180 tablet 3    clopidogrel (PLAVIX) 75 MG tablet Take 1 tablet (75 mg total) by mouth daily. 30 tablet 1   furosemide (LASIX) 20 MG tablet Take 1 tablet by mouth every morning for the next 3 days then 1 every morning as needed edema (Patient taking differently: Take 20 mg by mouth daily as needed for fluid or edema.) 30 tablet 0   gabapentin (NEURONTIN) 300 MG capsule One 2 times a day late afternoon and evening 60 capsule 3   lisinopril (ZESTRIL) 10 MG tablet TAKE 1 TABLET BY MOUTH AT BEDTIME. 90 tablet 3   spironolactone (ALDACTONE) 25 MG tablet TAKE 1/2 TABLET BY MOUTH DAILY (Patient taking differently: Take 25 mg by mouth every other day.) 45 tablet 3   No current facility-administered medications for this visit.     ROS:  See HPI  Physical Exam:  Today's Vitals   03/28/21 0934 03/28/21 0936  BP: (!) 192/103 (!) 199/111  Pulse: 75   Resp: 20   Temp: 97.7 F (36.5 C)   TempSrc: Temporal   SpO2: 100%   Weight: 225 lb 4.8 oz (102.2 kg)   Height: 6' (1.829 m)   PainSc: 0-No pain    Body mass  index is 30.56 kg/m.   Incision:  healing nicely Extremities:  moving all extremities equally    Assessment/Plan:  This is a 60 y.o. male who is s/p: eft CEA on 03/11/2021 by Dr. Trula Slade.   -pt doing well.  His incision is healing nicely.  His pre op symptoms have resolved.  He is clear for lifting but will take this slow.   -hypertensive today.  States that he runs in the 130's at home.  I have encouraged him to take his blood pressure in the morning and evening and keep a log so that he can show cardiology when he follows back up with them.   -continue asa/plavix/statin.  He has f/u with neurology and will defer to them how long to stay on Plavix.   -he will f/u in 9 months with carotid duplex.  He will call sooner if there are any issues.   Leontine Locket, [facility] [phone]   Clinic MD:  pt seen with Dr. Trula Slade

## 2021-03-29 ENCOUNTER — Other Ambulatory Visit: Payer: Self-pay

## 2021-03-29 DIAGNOSIS — Z9889 Other specified postprocedural states: Secondary | ICD-10-CM

## 2021-03-30 ENCOUNTER — Encounter: Payer: Self-pay | Admitting: Family Medicine

## 2021-03-31 NOTE — Telephone Encounter (Signed)
Nurses  His glucose levels that he sent [place] look good Feel free to share the following with [facility] as well Please do the following #1 notify the front that [name] will also be a patient of mine and it is fine for her to see [name] as well  #2 [name] should follow-up as planned in December  #3 it would be fine for him to go ahead with shingles vaccine as planned later in October  Thanks-Dr. Nicki Reaper

## 2021-04-02 ENCOUNTER — Other Ambulatory Visit (HOSPITAL_COMMUNITY): Payer: Self-pay

## 2021-04-03 MED FILL — Atorvastatin Calcium Tab 80 MG (Base Equivalent): ORAL | 90 days supply | Qty: 90 | Fill #1 | Status: AC

## 2021-04-03 MED FILL — Lisinopril Tab 10 MG: ORAL | 90 days supply | Qty: 90 | Fill #1 | Status: AC

## 2021-04-03 MED FILL — Spironolactone Tab 25 MG: ORAL | 90 days supply | Qty: 45 | Fill #1 | Status: AC

## 2021-04-04 ENCOUNTER — Other Ambulatory Visit (HOSPITAL_COMMUNITY): Payer: Self-pay

## 2021-04-04 MED ORDER — ZOSTER VAC RECOMB ADJUVANTED 50 MCG/0.5ML IM SUSR
INTRAMUSCULAR | 1 refills | Status: DC
  Filled 2021-04-21: qty 0.5, 1d supply, fill #0

## 2021-04-08 ENCOUNTER — Encounter (HOSPITAL_COMMUNITY): Payer: Self-pay | Admitting: Surgery

## 2021-04-12 DIAGNOSIS — I4891 Unspecified atrial fibrillation: Secondary | ICD-10-CM | POA: Diagnosis not present

## 2021-04-21 ENCOUNTER — Ambulatory Visit (INDEPENDENT_AMBULATORY_CARE_PROVIDER_SITE_OTHER): Payer: 59 | Admitting: Adult Health

## 2021-04-21 ENCOUNTER — Encounter: Payer: Self-pay | Admitting: Adult Health

## 2021-04-21 ENCOUNTER — Other Ambulatory Visit (HOSPITAL_COMMUNITY): Payer: Self-pay

## 2021-04-21 VITALS — BP 152/94 | HR 76 | Ht 72.0 in | Wt 229.0 lb

## 2021-04-21 DIAGNOSIS — I10 Essential (primary) hypertension: Secondary | ICD-10-CM | POA: Diagnosis not present

## 2021-04-21 DIAGNOSIS — E785 Hyperlipidemia, unspecified: Secondary | ICD-10-CM | POA: Diagnosis not present

## 2021-04-21 DIAGNOSIS — I63231 Cerebral infarction due to unspecified occlusion or stenosis of right carotid arteries: Secondary | ICD-10-CM | POA: Diagnosis not present

## 2021-04-21 DIAGNOSIS — Z720 Tobacco use: Secondary | ICD-10-CM | POA: Diagnosis not present

## 2021-04-21 MED ORDER — ASPIRIN EC 81 MG PO TBEC
81.0000 mg | DELAYED_RELEASE_TABLET | Freq: Every day | ORAL | 11 refills | Status: AC
Start: 2021-04-21 — End: ?
  Filled 2021-04-21: qty 30, 30d supply, fill #0

## 2021-04-21 NOTE — Patient Instructions (Signed)
Continue aspirin 81mg  daily and atorvastatin 80 mg daily for secondary stroke prevention  Okay to discontinue Plavix  at this time as no longer indicated form stroke standpoint   Continue to follow up with PCP regarding cholesterol and blood pressure management  Maintain strict control of hypertension with blood pressure goal below 130/90 and cholesterol with LDL cholesterol (bad cholesterol) goal below 70 mg/dL.         Thank you for coming to see [place] at [facility]. I hope we have been able to provide you high quality care today.  You may receive a patient satisfaction survey over the next few weeks. We would appreciate your feedback and comments so that we may continue to improve ourselves and the health of our patients.

## 2021-04-21 NOTE — Progress Notes (Signed)
[facility] [address] [place]. [place] [address] [phone]       HOSPITAL FOLLOW UP NOTE  Mr. DARAN FAVARO Date of Birth:  April 10, 1961 Medical Record Number:  [id number]   Reason for Referral:  hospital stroke follow up    SUBJECTIVE:   CHIEF COMPLAINT:  Chief Complaint  Patient presents with   Follow-up    Rm 3 with wife- reports he has been doing well. Was released from OT.     HPI:   BENECIO KLUGER is a 60 year old male with history of CAD status post CABG, status post LAA clipping, CHF, NSTEMI, lone A. fib, HLD, left CEA 2017, smoker who presented on 03/08/2021 for left-sided facial numbness, left arm weakness and headache.  Personally reviewed hospitalization pertinent progress notes, lab work and imaging.  CT head right temporal occipital infarct.  CTA head and neck right P1/P2 severe stenosis, right ICA 70 to 80% stenosis.  Radiology also reported possible mural thrombus just distal to the right ICA stenosis, however, per Dr. Phoebe Sharps review, it could be slow flow just beyond right ICA stenosis.  MRI showed right MCA scattered infarcts, biggest at right temporal occipital region.  Carotid Doppler right ICA 80 to 99% stenosis.  EF 50%.  LA moderately dilated.  A1c 5.8, LDL 63, creatinine 0.9.  Etiology likely due to right ICA high-grade stenosis.  Evaluated by Dr. Trula Slade with plans on undergoing right ICA procedure outpatient.  Recommended DAPT and atorvastatin 80 mg daily.  Tobacco cessation counseling provided.  Therapy eval's recommended outpatient OT for residual visual impairment.   Today, 04/21/2021, Mr. Tiley is being seen for hospital follow-up accompanied by his wife.  He underwent R CEA 9/9 by Dr. Trula Slade without complication. Reports residual left lower vision impairment bilaterally per recent ophthalmology visit but pt unable to notice and does not interfere with daily functioning or activity.  He has returned back to all prior activities and plans on  returning back to work in the next 1 to 2 weeks.  He works in maintenance at [employer].  Denies any other residual stroke deficits or new stroke/TIA symptoms.  He has remained on aspirin and Plavix as well as atorvastatin without side effects. Recent VVS OV deferred ongoing need of DAPT to our office. Upon further discussion, noted to be taking aspirin daily PTA but he reports only taking 2-3x/week but majority of the time he would forget to take.  Blood pressure today 152/94.  Completed 30-day cardiac event monitor which was negative for atrial fibrillation.  Continued tobacco use.  He questions if ongoing need with our office needed as he is closely followed by cardiology and PCP.      ROS:   14 system review of systems performed and negative with exception of no complaints  PMH:  Past Medical History:  Diagnosis Date   Acute systolic congestive heart failure (HCC)    Atrial fibrillation with rapid ventricular response ([place]) 12/13/2015   "had a heart monitor and can't find a source for the a. fib"   Cardiomyopathy ([place]) 12/13/2015   Cardiomyopathy, ischemic 12/14/2015   a. EF 20-25% in 12/2015 b. EF at 35-40% by repeat echo in 10/2016   Coronary artery disease involving native coronary artery with unstable angina pectoris ([place]) 12/15/2015   Hypercholesterolemia 12/15/2015   Mitral regurgitation 12/14/2015   moderate   NSTEMI (non-ST elevated myocardial infarction) (HCC)    PONV (postoperative nausea and vomiting)    "sick with very first surgery  but nothing since" in 1990s   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 12/17/2015   LIMA to LAD, SVG to OM, SVG to PDA, EVH via right thigh with clipping of LA appendage   Stroke ([place])    Tobacco abuse     PSH:  Past Surgical History:  Procedure Laterality Date   CARDIAC CATHETERIZATION N/A 12/15/2015   Procedure: Right/Left Heart Cath and Coronary Angiography;  Surgeon: Wellington Hampshire, MD;  Location: [facility];  Service:  Cardiovascular;  Laterality: N/A;   CLIPPING OF ATRIAL APPENDAGE Left 12/17/2015   Procedure: CLIPPING OF ATRIAL APPENDAGE;  Surgeon: [name], MD;  Location: [facility];  Service: Open Heart Surgery;  Laterality: Left;   COLONOSCOPY WITH PROPOFOL N/A 06/21/2016   Procedure: COLONOSCOPY WITH PROPOFOL;  Surgeon: Manus Gunning, MD;  Location: [facility];  Service: Gastroenterology;  Laterality: N/A;   CORONARY ARTERY BYPASS GRAFT N/A 12/17/2015   Procedure: CORONARY ARTERY BYPASS GRAFTING (CABG) x three , using left internal mammary artery and right leg greater saphenous vein;  Surgeon: [name], MD;  Location: [facility];  Service: Open Heart Surgery;  Laterality: N/A;   ENDARTERECTOMY Left 02/09/2016   Procedure: LEFT CAROTID ENDARTERECTOMY;  Surgeon: Serafina Mitchell, MD;  Location: [facility];  Service: Vascular;  Laterality: Left;   ENDARTERECTOMY Right 03/11/2021   Procedure: RIGHT CAROTID ENDARTERECTOMY;  Surgeon: Serafina Mitchell, MD;  Location: [facility] OR;  Service: Vascular;  Laterality: Right;   KNEE ARTHROSCOPY Right 1991   PATCH ANGIOPLASTY Left 02/09/2016   Procedure: PATCH ANGIOPLASTY USING Rueben Bash BIOLOGIC PATCH;  Surgeon: Serafina Mitchell, MD;  Location: [facility];  Service: Vascular;  Laterality: Left;   PATCH ANGIOPLASTY Right 03/11/2021   Procedure: PATCH ANGIOPLASTY;  Surgeon: Serafina Mitchell, MD;  Location: [facility];  Service: Vascular;  Laterality: Right;    Social History:  Social History   Socioeconomic History   Marital status: Married    Spouse name: Not on file   Number of children: 2   Years of education: Not on file   Highest education level: Not on file  Occupational History   Occupation: Maintanence    Employer: [employer]    Comment: [employer]  Tobacco Use   Smoking status: Some Days    Packs/day: 1.00    Years: 35.00    Pack years: 35.00    Types: Cigarettes    Last attempt to quit: 12/13/2015    Years since quitting: 5.3   Smokeless tobacco: Former    Quit date:  12/13/2015  Vaping Use   Vaping Use: Never used  Substance and Sexual Activity   Alcohol use: Not Currently    Comment: rare   Drug use: No   Sexual activity: Yes  Other Topics Concern   Not on file  Social History Narrative   Not on file   Social Determinants of Health   Financial Resource Strain: Not on file  Food Insecurity: Not on file  Transportation Needs: Not on file  Physical Activity: Not on file  Stress: Not on file  Social Connections: Not on file  Intimate Partner Violence: Not on file    Family History:  Family History  Problem Relation Age of Onset   Heart attack Paternal Grandfather    Heart failure Father    Atrial fibrillation Father     Medications:   Current Outpatient Medications on File Prior to Visit  Medication Sig Dispense Refill  aspirin 325 MG EC tablet Take 1 tablet (325 mg total) by mouth daily. 30 tablet 1   atorvastatin (LIPITOR) 80 MG tablet TAKE 1 TABLET BY MOUTH DAILY. 90 tablet 3   carvedilol (COREG) 12.5 MG tablet Take 1 tablet (12.5 mg total) by mouth 2 (two) times daily. 180 tablet 3   clopidogrel (PLAVIX) 75 MG tablet Take 1 tablet (75 mg total) by mouth daily. 30 tablet 1   furosemide (LASIX) 20 MG tablet Take 1 tablet by mouth every morning for the next 3 days then 1 every morning as needed edema 30 tablet 0   lisinopril (ZESTRIL) 10 MG tablet TAKE 1 TABLET BY MOUTH AT BEDTIME. 90 tablet 3   spironolactone (ALDACTONE) 25 MG tablet TAKE 1/2 TABLET BY MOUTH DAILY (Patient taking differently: Take 25 mg by mouth every other day.) 45 tablet 3   Zoster Vaccine Adjuvanted Inov8 Surgical) injection To be administered by the pharmacist 0.5 mL 1   No current facility-administered medications on file prior to visit.    Allergies:  No Known Allergies    OBJECTIVE:  Physical Exam  Vitals:   04/21/21 1509  BP: (!) 152/94  Pulse: 76  SpO2: 96%  Weight: 229 lb (103.9 kg)  Height: 6' (1.829 m)   Body mass index is 31.06 kg/m. No  results found.  Post stroke PHQ 2/9 Depression screen PHQ 2/9 03/24/2021  Decreased Interest 0  Down, Depressed, Hopeless 0  PHQ - 2 Score 0     General: well developed, well nourished, very pleasant middle-age Caucasian male, seated, in no evident distress Head: head normocephalic and atraumatic.   Neck: supple with no carotid or supraclavicular bruits Cardiovascular: regular rate and rhythm, no murmurs Musculoskeletal: no deformity Skin:  no rash/petichiae Vascular:  Normal pulses all extremities   Neurologic Exam Mental Status: Awake and fully alert.  Fluent speech and language.  Oriented to place and time. Recent and remote memory intact. Attention span, concentration and fund of knowledge appropriate. Mood and affect appropriate.  Cranial Nerves: Fundoscopic exam reveals sharp disc margins. Pupils equal, briskly reactive to light. Extraocular movements full without nystagmus. Visual fields left inferior homonymous quadrantanopsia.  Hearing intact. Facial sensation intact. Face, tongue, palate moves normally and symmetrically.  Motor: Normal bulk and tone. Normal strength in all tested extremity muscles Sensory.: intact to touch , pinprick , position and vibratory sensation.  Coordination: Rapid alternating movements normal in all extremities. Finger-to-nose and heel-to-shin performed accurately bilaterally. Gait and Station: Arises from chair without difficulty. Stance is normal. Gait demonstrates normal stride length and balance without use of AD. Tandem walk and heel toe without difficulty.  Reflexes: 1+ and symmetric. Toes downgoing.     NIHSS  1 Modified Rankin  1      ASSESSMENT: WASIL WOLKE is a 60 y.o. year old male right MCA scattered stroke (involving right occipital lobe, right frontal lobe and right parietal) on 03/08/2021 likely secondary to symptomatic right carotid high-grade stenosis s/p CEA 03/11/2021. Vascular risk factors include hx of L CEA 2017, CAD s/p CABG  with clipping of LA appendage, ischemic cardiomyopathy, CHF, HLD, HTN and tobacco use.      PLAN:  Right MCA stroke s/p R CEA:  Residual deficit: Left inferior homonymous quadrantanopia - does not interfere with daily functioning or activity.  Monitored by ophthalmology.  Continue to follow-up with vascular surgery Dr. Trula Slade with plans on repeat carotid ultrasound around 12/2021 Cardiac event monitor negative for atrial fibrillation Continue aspirin 81 mg  daily and stop plavix as no indication for continued DAPT. He was not consistently taking aspirin PTA.  Continue atorvastatin 80 mg daily for secondary stroke prevention.   Discussed secondary stroke prevention measures and importance of close PCP follow up for aggressive stroke risk factor management. I have gone over the pathophysiology of stroke, warning signs and symptoms, risk factors and their management in some detail with instructions to go to the closest emergency room for symptoms of concern. HTN: BP goal <130/90.  Stable on higher side today managed/monitored by PCP HLD: LDL goal <70. Recent LDL 63 on atorvastatin 80 mg daily.  Multiple cardiac risk factors: Routinely followed by cardiology Tobacco use: Tobacco cessation highly encouraged has continued use greatly increases risk of additional strokes and heart disease    Per patient request, okay to follow up as needed as closely monitored by PCP and cardiology. He was advised to call office with any questions or concerns in the future   CC:  [facility] provider: Dr. Leonie Man PCP: Kathyrn Drown, MD    I spent 56 minutes of face-to-face and non-face-to-face time with patient and wife.  This included previsit chart review including review of recent hospitalization, lab review, study review, electronic health record documentation, patient and wife education and discussion regarding recent stroke including etiology, secondary stroke prevention measures and importance of managing stroke  risk factors, residual deficits and typical recovery time and answered all other questions to patient and wife's satisfaction  Frann Rider, AGNP-BC  [facility] [address] [place], [place] [address]  Phone [phone] Fax [phone] Note: This document was prepared with digital dictation and possible smart phrase technology. Any transcriptional errors that result from this process are unintentional.

## 2021-04-22 ENCOUNTER — Other Ambulatory Visit (HOSPITAL_COMMUNITY): Payer: Self-pay

## 2021-04-22 NOTE — Progress Notes (Signed)
I agree with the above plan 

## 2021-05-19 DIAGNOSIS — H5213 Myopia, bilateral: Secondary | ICD-10-CM | POA: Diagnosis not present

## 2021-05-23 NOTE — Progress Notes (Signed)
Cardiology Office Note    Date:  05/24/2021   ID:  [name] [name], [name] [name] Jul 17, 1960, MRN [id number]  PCP:  Kathyrn Drown, MD  Cardiologist: Previously Dr. Bronson Ing --> Needs to switch to new MD  Chief Complaint  Patient presents with   Follow-up    2 month visit    History of Present Illness:    [name] [name] is a 60 y.o. male with past medical history of CAD (s/p CABG in 12/2015 with LIMA-LAD, SVG-OM and SVG-PDA), HFmrEF/ICM (EF 20-25% in 12/2015, at 35-40% by repeat echo in 10/2016 and 50% in 03/2021), carotid artery stenosis (s/p L CEA in 02/2016 and R CEA in 03/2021), HTN, HLD, tobacco use and recent CVA (occurring in 03/2021) who presents to the office today for 26-month follow-up.   He was last examined by Ermalinda Barrios, PA-C in 03/2021 following his recent CVA and was experiencing residual peripheral vision deficits. He denied any recent anginal symptoms. A 2-week monitor was recommended to rule-out atrial fibrillation and Coreg was increased to 12.5mg  BID given his elevated BP. His monitor showed predominately NSR with an average HR of 77 bpm with PAC's and PVC's representing less than 1% of total beats. He did have brief episodes of SVT but no definitive atrial fibrillation. He did follow-up with Neurology in 04/2021 and Plavix was discontinued.   In talking with the patient today, he reports overall doing well since his last office visit. He has resumed work and says his only residual deficit is mild peripheral vision loss but he was cleared by Ophthalmology to resume driving. He denies any recent chest pain or dyspnea on exertion. No recent orthopnea, PND or pitting edema. Says his blood pressure has been well controlled when checked at home with SBP typically in the 130's.  He did come directly from work today and BP was initially elevated at 164/88, rechecked and improved to 138/82 which he says is similar to his typical values. He does continue to smoke approximately 0.5 ppd.    Past Medical History:  Diagnosis Date   Acute systolic congestive heart failure (HCC)    Atrial fibrillation with rapid ventricular response ([place]) 12/13/2015   a. isolated episode in 2017 --> no recurrence by monitors   Cardiomyopathy ([place]) 12/13/2015   Cardiomyopathy, ischemic 12/14/2015   a. EF 20-25% in 12/2015 b. EF at 35-40% by repeat echo in 10/2016   Coronary artery disease involving native coronary artery with unstable angina pectoris ([place]) 12/15/2015   Hypercholesterolemia 12/15/2015   Mitral regurgitation 12/14/2015   moderate   NSTEMI (non-ST elevated myocardial infarction) (HCC)    PONV (postoperative nausea and vomiting)    "sick with very first surgery but nothing since" in 1990s   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 12/17/2015   LIMA to LAD, SVG to OM, SVG to PDA, EVH via right thigh with clipping of LA appendage   Stroke ([place])    Tobacco abuse     Past Surgical History:  Procedure Laterality Date   CARDIAC CATHETERIZATION N/A 12/15/2015   Procedure: Right/Left Heart Cath and Coronary Angiography;  Surgeon: Wellington Hampshire, MD;  Location: [facility];  Service: Cardiovascular;  Laterality: N/A;   CLIPPING OF ATRIAL APPENDAGE Left 12/17/2015   Procedure: CLIPPING OF ATRIAL APPENDAGE;  Surgeon: [name], MD;  Location: [place];  Service: Open Heart Surgery;  Laterality: Left;   COLONOSCOPY WITH PROPOFOL N/A 06/21/2016   Procedure: COLONOSCOPY WITH PROPOFOL;  Surgeon: Manus Gunning, MD;  Location: [facility];  Service: Gastroenterology;  Laterality: N/A;   CORONARY ARTERY BYPASS GRAFT N/A 12/17/2015   Procedure: CORONARY ARTERY BYPASS GRAFTING (CABG) x three , using left internal mammary artery and right leg greater saphenous vein;  Surgeon: [name], MD;  Location: [place];  Service: Open Heart Surgery;  Laterality: N/A;   ENDARTERECTOMY Left 02/09/2016   Procedure: LEFT CAROTID ENDARTERECTOMY;  Surgeon: Serafina Mitchell, MD;  Location:  [place];  Service: Vascular;  Laterality: Left;   ENDARTERECTOMY Right 03/11/2021   Procedure: RIGHT CAROTID ENDARTERECTOMY;  Surgeon: Serafina Mitchell, MD;  Location: [place];  Service: Vascular;  Laterality: Right;   KNEE ARTHROSCOPY Right 1991   PATCH ANGIOPLASTY Left 02/09/2016   Procedure: PATCH ANGIOPLASTY USING Rueben Bash BIOLOGIC PATCH;  Surgeon: Serafina Mitchell, MD;  Location: [place];  Service: Vascular;  Laterality: Left;   PATCH ANGIOPLASTY Right 03/11/2021   Procedure: PATCH ANGIOPLASTY;  Surgeon: Serafina Mitchell, MD;  Location: [facility];  Service: Vascular;  Laterality: Right;    Current Medications: Outpatient Medications Prior to Visit  Medication Sig Dispense Refill   aspirin EC 81 MG tablet Take 1 tablet (81 mg total) by mouth daily. Swallow whole. 30 tablet 11   atorvastatin (LIPITOR) 80 MG tablet TAKE 1 TABLET BY MOUTH DAILY. 90 tablet 3   carvedilol (COREG) 12.5 MG tablet Take 1 tablet (12.5 mg total) by mouth 2 (two) times daily. 180 tablet 3   furosemide (LASIX) 20 MG tablet Take 1 tablet by mouth every morning for the next 3 days then 1 every morning as needed edema 30 tablet 0   lisinopril (ZESTRIL) 10 MG tablet TAKE 1 TABLET BY MOUTH AT BEDTIME. 90 tablet 3   spironolactone (ALDACTONE) 25 MG tablet TAKE 1/2 TABLET BY MOUTH DAILY (Patient taking differently: Take 25 mg by mouth every other day.) 45 tablet 3   Zoster Vaccine Adjuvanted [facility]) injection To be administered by the pharmacist 0.5 mL 1   No facility-administered medications prior to visit.     Allergies:   Patient has no known allergies.   Social History   Socioeconomic History   Marital status: Married    Spouse name: Not on file   Number of children: 2   Years of education: Not on file   Highest education level: Not on file  Occupational History   Occupation: Maintanence    Employer: [employer]    Comment: [employer]  Tobacco Use   Smoking status: Some Days    Packs/day: 1.00    Years: 35.00    Pack  years: 35.00    Types: Cigarettes    Last attempt to quit: 12/13/2015    Years since quitting: 5.4   Smokeless tobacco: Former    Quit date: 12/13/2015  Vaping Use   Vaping Use: Never used  Substance and Sexual Activity   Alcohol use: Not Currently    Comment: rare   Drug use: No   Sexual activity: Yes  Other Topics Concern   Not on file  Social History Narrative   Not on file   Social Determinants of Health   Financial Resource Strain: Not on file  Food Insecurity: Not on file  Transportation Needs: Not on file  Physical Activity: Not on file  Stress: Not on file  Social Connections: Not on file     Family History:  The patient's family history includes Atrial fibrillation in his father; Heart attack in his  paternal grandfather; Heart failure in his father.   Review of Systems:    Please see the history of present illness.     All other systems reviewed and are otherwise negative except as noted above.   Physical Exam:    VS:  BP 138/82   Pulse (!) 56   Ht 6' (1.829 m)   Wt 235 lb (106.6 kg)   SpO2 99%   BMI 31.87 kg/m    General: Well developed, well nourished,male appearing in no acute distress. Head: Normocephalic, atraumatic. Neck: No carotid bruits. JVD not elevated.  Lungs: Respirations regular and unlabored, without wheezes or rales.  Heart: Regular rate and rhythm. No S3 or S4.  No murmur, no rubs, or gallops appreciated. Abdomen: Appears non-distended. No obvious abdominal masses. Msk:  Strength and tone appear normal for age. No obvious joint deformities or effusions. Extremities: No clubbing or cyanosis. Trace ankle edema bilaterally.  Distal pedal pulses are 2+ bilaterally. Neuro: Alert and oriented X 3. Moves all extremities spontaneously. No focal deficits noted. Psych:  Responds to questions appropriately with a normal affect. Skin: No rashes or lesions noted  Wt Readings from Last 3 Encounters:  05/24/21 235 lb (106.6 kg)  04/21/21 229 lb  (103.9 kg)  03/28/21 225 lb 4.8 oz (102.2 kg)     Studies/Labs Reviewed:   EKG:  EKG is not ordered today.    Recent Labs: 02/01/2021: B Natriuretic Peptide 90.0 03/11/2021: ALT 23 03/12/2021: BUN 11; Creatinine, Ser 0.98; Hemoglobin 15.0; Platelets 246; Potassium 4.0; Sodium 135   Lipid Panel    Component Value Date/Time   CHOL 113 03/12/2021 0433   TRIG 74 03/12/2021 0433   HDL 31 (L) 03/12/2021 0433   CHOLHDL 3.6 03/12/2021 0433   VLDL 15 03/12/2021 0433   LDLCALC 67 03/12/2021 0433    Additional studies/ records that were reviewed today include:   Echocardiogram: 03/2021 IMPRESSIONS     1. Left ventricular ejection fraction, by estimation, is 50%. The left  ventricle has low normal function. Left ventricular endocardial border not  optimally defined to evaluate regional wall motion. The left ventricular  internal cavity size was  moderately dilated. Left ventricular diastolic parameters are consistent  with Grade I diastolic dysfunction (impaired relaxation).   2. Right ventricular systolic function is normal. The right ventricular  size is mildly enlarged. There is normal pulmonary artery systolic  pressure.   3. Left atrial size was moderately dilated.   4. The mitral valve is normal in structure. Mild mitral valve  regurgitation. No evidence of mitral stenosis.   5. The aortic valve is tricuspid. Aortic valve regurgitation is not  visualized. Mild to moderate aortic valve sclerosis/calcification is  present, without any evidence of aortic stenosis. Aortic valve area, by  VTI measures 2.20 cm. Aortic valve mean  gradient measures 4.0 mmHg. Aortic valve Vmax measures 1.29 m/s.   6. The inferior vena cava is normal in size with greater than 50%  respiratory variability, suggesting right atrial pressure of 3 mmHg.   7. Recommend limited echo with definity to assess LV Apex.   Carotid Dopplers: 03/2021 Summary:  Right Carotid: Velocities in the right ICA are  consistent with a 80-99%                 stenosis.   Left Carotid: Velocities in the left ICA are consistent with a 1-39%  stenosis.   Event Monitor: 03/2021 ZIO XT reviewed.  13 days, 15 hours analyzed.  Predominant  rhythm is sinus with heart rate ranging from 53 bpm up to 118 bpm and average heart rate 77 bpm.  There were rare PACs including couplets and triplets representing less than 1% total beats.  There were rare PVCs including couplets and triplets representing less than 1% total beats.  Limited episodes of ventricular bigeminy and trigeminy were noted.  There were 7 episodes of PSVT, the longest of which lasted for 18 beats with average heart rate 115.  No sustained events were noted and there were no pauses.   Assessment:    1. Coronary artery disease involving native coronary artery of native heart without angina pectoris   2. Ischemic cardiomyopathy   3. Bilateral carotid artery stenosis   4. Essential hypertension   5. Hyperlipidemia LDL goal <70   6. Paroxysmal SVT (supraventricular tachycardia) (HCC)      Plan:   In order of problems listed above:  1. CAD - He is s/p CABG in 12/2015 with LIMA-LAD, SVG-OM and SVG-PDA. He remains active at baseline and denies any recent anginal symptoms.  - Continue current medication regimen with ASA 81mg  daily, Atorvastatin 80mg  daily and Coreg 12.5mg  BID.   2. HFimpEF/ICM - His EF was previously at 20-25% in 12/2015, at 35-40% by repeat echo in 10/2016 and further improved to 50% in 03/2021. His breathing has been at baseline and he does not appear volume overloaded today.  - Continue current medication regimen with Coreg 12.5mg  BID, Lisinopril 10mg  daily and Spironolactone 12.5mg  daily. Listed as taking Lasix as needed but he has not needed to utilize this recently.   3. Carotid Artery Stenosis - He is s/p L CEA in 02/2016 and recently underwent R CEA in 03/2021 after having a CVA. Followed by Vascular Surgery. Continue ASA and  statin therapy.   4. HTN - BP was initially elevated but improved to 138/82 after sitting for several minutes. His BP has been well-controlled at home per his report. Continue current regimen for now. Can titrate Lisinopril or Spironolactone in the future if needed. Would not further titrate Coreg given HR in the 50's at times.   5. HLD - FLP during recent admission showed total cholesterol of 114, triglycerides 125, HDL 26 and LDL 63. Continue Atorvastatin 80mg  daily.   6. SVT - Recent monitor showed PAC's and PVC's representing less than 1% of total beats and  brief episodes of SVT but no definitive atrial fibrillation.  - He denies any recent palpitations. Continue Coreg 12.5mg  BID.    Medication Adjustments/Labs and Tests Ordered: Current medicines are reviewed at length with the patient today.  Concerns regarding medicines are outlined above.  Medication changes, Labs and Tests ordered today are listed in the Patient Instructions below. Patient Instructions  Medication Instructions:   *If you need a refill on your cardiac medications before your next appointment, please call your pharmacy*   Lab Work:  None   Testing/Procedures:  None   Follow-Up: At [facility], you and your health needs are our priority.  As part of our continuing mission to provide you with exceptional heart care, we have created designated Provider Care Teams.  These Care Teams include your primary Cardiologist (physician) and Advanced Practice Providers (APPs -  Physician Assistants and Nurse Practitioners) who all work together to provide you with the care you need, when you need it.  We recommend signing up for the patient portal called "MyChart".  Sign up information is provided on this After Visit Summary.  MyChart is  used to connect with patients for Virtual Visits (Telemedicine).  Patients are able to view lab/test results, encounter notes, upcoming appointments, etc.  Non-urgent messages can be  sent to your provider as well.   To learn more about what you can do with MyChart, go to [email or web address].    Your next appointment:   6 month(s)  The format for your next appointment:   In Person  Provider:   You will see one of the following Advanced Practice Providers on your designated Care Team or an MD:   [place], PA-C  Ermalinda Barrios, PA-C   Other Instructions  Please let us know if your blood pressure becomes elevated or heart rate is consistently less than 50 bpm.    Signed, Erma Heritage, PA-C  05/24/2021 7:45 PM    [place]. [address] [place], [place] [address] Phone: [phone] Fax: [phone]

## 2021-05-24 ENCOUNTER — Encounter: Payer: Self-pay | Admitting: Student

## 2021-05-24 ENCOUNTER — Ambulatory Visit (INDEPENDENT_AMBULATORY_CARE_PROVIDER_SITE_OTHER): Payer: 59 | Admitting: Student

## 2021-05-24 ENCOUNTER — Other Ambulatory Visit: Payer: Self-pay

## 2021-05-24 VITALS — BP 138/82 | HR 56 | Ht 72.0 in | Wt 235.0 lb

## 2021-05-24 DIAGNOSIS — I10 Essential (primary) hypertension: Secondary | ICD-10-CM | POA: Diagnosis not present

## 2021-05-24 DIAGNOSIS — I6523 Occlusion and stenosis of bilateral carotid arteries: Secondary | ICD-10-CM

## 2021-05-24 DIAGNOSIS — I471 Supraventricular tachycardia: Secondary | ICD-10-CM | POA: Diagnosis not present

## 2021-05-24 DIAGNOSIS — I2510 Atherosclerotic heart disease of native coronary artery without angina pectoris: Secondary | ICD-10-CM | POA: Diagnosis not present

## 2021-05-24 DIAGNOSIS — E785 Hyperlipidemia, unspecified: Secondary | ICD-10-CM | POA: Diagnosis not present

## 2021-05-24 DIAGNOSIS — I255 Ischemic cardiomyopathy: Secondary | ICD-10-CM

## 2021-05-24 NOTE — Patient Instructions (Signed)
Medication Instructions:   *If you need a refill on your cardiac medications before your next appointment, please call your pharmacy*   Lab Work:  None   Testing/Procedures:  None   Follow-Up: At [facility], you and your health needs are our priority.  As part of our continuing mission to provide you with exceptional heart care, we have created designated Provider Care Teams.  These Care Teams include your primary Cardiologist (physician) and Advanced Practice Providers (APPs -  Physician Assistants and Nurse Practitioners) who all work together to provide you with the care you need, when you need it.  We recommend signing up for the patient portal called "MyChart".  Sign up information is provided on this After Visit Summary.  MyChart is used to connect with patients for Virtual Visits (Telemedicine).  Patients are able to view lab/test results, encounter notes, upcoming appointments, etc.  Non-urgent messages can be sent to your provider as well.   To learn more about what you can do with MyChart, go to [email or web address].    Your next appointment:   6 month(s)  The format for your next appointment:   In Person  Provider:   You will see one of the following Advanced Practice Providers on your designated Care Team or an MD:   [place], PA-C  Ermalinda Barrios, PA-C   Other Instructions  Please let us know if your blood pressure becomes elevated or heart rate is consistently less than 50 bpm.

## 2021-06-23 ENCOUNTER — Ambulatory Visit: Payer: 59 | Admitting: Family Medicine

## 2021-06-30 ENCOUNTER — Ambulatory Visit (INDEPENDENT_AMBULATORY_CARE_PROVIDER_SITE_OTHER): Payer: 59 | Admitting: Family Medicine

## 2021-06-30 ENCOUNTER — Other Ambulatory Visit: Payer: Self-pay

## 2021-06-30 ENCOUNTER — Other Ambulatory Visit (HOSPITAL_COMMUNITY): Payer: Self-pay

## 2021-06-30 VITALS — BP 136/84 | Ht 72.0 in | Wt 236.8 lb

## 2021-06-30 DIAGNOSIS — R739 Hyperglycemia, unspecified: Secondary | ICD-10-CM | POA: Diagnosis not present

## 2021-06-30 DIAGNOSIS — Z125 Encounter for screening for malignant neoplasm of prostate: Secondary | ICD-10-CM | POA: Diagnosis not present

## 2021-06-30 DIAGNOSIS — L089 Local infection of the skin and subcutaneous tissue, unspecified: Secondary | ICD-10-CM | POA: Diagnosis not present

## 2021-06-30 DIAGNOSIS — E7800 Pure hypercholesterolemia, unspecified: Secondary | ICD-10-CM | POA: Diagnosis not present

## 2021-06-30 MED ORDER — DOXYCYCLINE HYCLATE 100 MG PO TABS
100.0000 mg | ORAL_TABLET | Freq: Two times a day (BID) | ORAL | 0 refills | Status: DC
  Filled 2021-06-30: qty 20, 10d supply, fill #0

## 2021-06-30 MED ORDER — LISINOPRIL 20 MG PO TABS
20.0000 mg | ORAL_TABLET | Freq: Every day | ORAL | 3 refills | Status: DC
  Filled 2021-06-30: qty 90, 90d supply, fill #0
  Filled 2022-01-25: qty 90, 90d supply, fill #1
  Filled 2022-04-29: qty 90, 90d supply, fill #2

## 2021-06-30 NOTE — Progress Notes (Signed)
° °  Subjective:    Patient ID: REA RESER, male    DOB: [birth year], 60 y.o.   MRN: [id number]  Hyperlipidemia This is a chronic problem. The current episode started more than 1 year ago. Treatments tried: lipitor. There are no compliance problems.  Risk factors for coronary artery disease include dyslipidemia.  Foot infection  Hypercholesterolemia - Plan: Lipid panel  Hyperglycemia - Plan: Hemoglobin L5Q, Basic metabolic panel  Screening PSA (prostate specific antigen) - Plan: PSA    Review of Systems     Objective:   Physical Exam  General-in no acute distress Eyes-no discharge Lungs-respiratory rate normal, CTA CV-no murmurs,RRR Extremities skin warm dry no edema Neuro grossly normal Behavior normal, alert       Assessment & Plan:  1. Hypercholesterolemia Hyperlipidemia healthy diet recommended continue medication check lipid panel - Lipid panel  2. Foot infection Patient has intermittent foot infections that a podiatrist recommended Cipro.  I told the patient I was concerned that Cipro can increase the risk of aneurysms and tendon ruptures therefore I would recommend doxycycline when this occurs.  He states the foot will become red swollen drained some fluid and gets better with the Cipro after 3 days.  This typically occurs during the winter months.  I encouraged the patient He has this to follow-up here so we could recheck him otherwise could try doxycycline.  Hold off on Cipro because of potential side effects-but if patient finds that doxycycline does not do the trick to connect back with [place] regarding the Cipro  3. Hyperglycemia Watch starches stay active check A1c met 7 - Hemoglobin G9E - Basic metabolic panel  4. Screening PSA (prostate specific antigen) PSA screening recommended - PSA  Patient encouraged to quit smoking patient does not want any medications currently  Follow-up by spring time patient was encouraged also send [place] blood pressure readings  within the next 2 weeks

## 2021-06-30 NOTE — Patient Instructions (Signed)
DASH Eating Plan °DASH stands for Dietary Approaches to Stop Hypertension. The DASH eating plan is a healthy eating plan that has been shown to: °Reduce high blood pressure (hypertension). °Reduce your risk for type 2 diabetes, heart disease, and stroke. °Help with weight loss. °What are tips for following this plan? °Reading food labels °Check food labels for the amount of salt (sodium) per serving. Choose foods with less than 5 percent of the Daily Value of sodium. Generally, foods with less than 300 milligrams (mg) of sodium per serving fit into this eating plan. °To find whole grains, look for the word "whole" as the first word in the ingredient list. °Shopping °Buy products labeled as "low-sodium" or "no salt added." °Buy fresh foods. Avoid canned foods and pre-made or frozen meals. °Cooking °Avoid adding salt when cooking. Use salt-free seasonings or herbs instead of table salt or sea salt. Check with your health care provider or pharmacist before using salt substitutes. °Do not fry foods. Cook foods using healthy methods such as baking, boiling, grilling, roasting, and broiling instead. °Cook with heart-healthy oils, such as olive, canola, avocado, soybean, or sunflower oil. °Meal planning ° °Eat a balanced diet that includes: °4 or more servings of fruits and 4 or more servings of vegetables each day. Try to fill one-half of your plate with fruits and vegetables. °6-8 servings of whole grains each day. °Less than 6 oz (170 g) of lean meat, poultry, or fish each day. A 3-oz (85-g) serving of meat is about the same size as a deck of cards. One egg equals 1 oz (28 g). °2-3 servings of low-fat dairy each day. One serving is 1 cup (237 mL). °1 serving of nuts, seeds, or beans 5 times each week. °2-3 servings of heart-healthy fats. Healthy fats called omega-3 fatty acids are found in foods such as walnuts, flaxseeds, fortified milks, and eggs. These fats are also found in cold-water fish, such as sardines, salmon,  and mackerel. °Limit how much you eat of: °Canned or prepackaged foods. °Food that is high in trans fat, such as some fried foods. °Food that is high in saturated fat, such as fatty meat. °Desserts and other sweets, sugary drinks, and other foods with added sugar. °Full-fat dairy products. °Do not salt foods before eating. °Do not eat more than 4 egg yolks a week. °Try to eat at least 2 vegetarian meals a week. °Eat more home-cooked food and less restaurant, buffet, and fast food. °Lifestyle °When eating at a restaurant, ask that your food be prepared with less salt or no salt, if possible. °If you drink alcohol: °Limit how much you use to: °0-1 drink a day for women who are not pregnant. °0-2 drinks a day for men. °Be aware of how much alcohol is in your drink. In the [place] one drink equals one 12 oz bottle of beer (355 mL), one 5 oz glass of wine (148 mL), or one 1½ oz glass of hard liquor (44 mL). °General information °Avoid eating more than 2,300 mg of salt a day. If you have hypertension, you may need to reduce your sodium intake to 1,500 mg a day. °Work with your health care provider to maintain a healthy body weight or to lose weight. Ask what an ideal weight is for you. °Get at least 30 minutes of exercise that causes your heart to beat faster (aerobic exercise) most days of the week. Activities may include walking, swimming, or biking. °Work with your health care provider or dietitian to   adjust your eating plan to your individual calorie needs. °What foods should I eat? °Fruits °All fresh, dried, or frozen fruit. Canned fruit in natural juice (without added sugar). °Vegetables °Fresh or frozen vegetables (raw, steamed, roasted, or grilled). Low-sodium or reduced-sodium tomato and vegetable juice. Low-sodium or reduced-sodium tomato sauce and tomato paste. Low-sodium or reduced-sodium canned vegetables. °Grains °Whole-grain or whole-wheat bread. Whole-grain or whole-wheat pasta. Brown rice. Oatmeal. Quinoa.  Bulgur. Whole-grain and low-sodium cereals. Pita bread. Low-fat, low-sodium crackers. Whole-wheat flour tortillas. °Meats and other proteins °Skinless chicken or turkey. Ground chicken or turkey. Pork with fat trimmed off. Fish and seafood. Egg whites. Dried beans, peas, or lentils. Unsalted nuts, nut butters, and seeds. Unsalted canned beans. Lean cuts of beef with fat trimmed off. Low-sodium, lean precooked or cured meat, such as sausages or meat loaves. °Dairy °Low-fat (1%) or fat-free (skim) milk. Reduced-fat, low-fat, or fat-free cheeses. Nonfat, low-sodium ricotta or cottage cheese. Low-fat or nonfat yogurt. Low-fat, low-sodium cheese. °Fats and oils °Soft margarine without trans fats. Vegetable oil. Reduced-fat, low-fat, or light mayonnaise and salad dressings (reduced-sodium). Canola, safflower, olive, avocado, soybean, and sunflower oils. Avocado. °Seasonings and condiments °Herbs. Spices. Seasoning mixes without salt. °Other foods °Unsalted popcorn and pretzels. Fat-free sweets. °The items listed above may not be a complete list of foods and beverages you can eat. Contact a dietitian for more information. °What foods should I avoid? °Fruits °Canned fruit in a light or heavy syrup. Fried fruit. Fruit in cream or butter sauce. °Vegetables °Creamed or fried vegetables. Vegetables in a cheese sauce. Regular canned vegetables (not low-sodium or reduced-sodium). Regular canned tomato sauce and paste (not low-sodium or reduced-sodium). Regular tomato and vegetable juice (not low-sodium or reduced-sodium). Pickles. Olives. °Grains °Baked goods made with fat, such as croissants, muffins, or some breads. Dry pasta or rice meal packs. °Meats and other proteins °Fatty cuts of meat. Ribs. Fried meat. Bacon. Bologna, salami, and other precooked or cured meats, such as sausages or meat loaves. Fat from the back of a pig (fatback). Bratwurst. Salted nuts and seeds. Canned beans with added salt. Canned or smoked fish.  Whole eggs or egg yolks. Chicken or turkey with skin. °Dairy °Whole or 2% milk, cream, and half-and-half. Whole or full-fat cream cheese. Whole-fat or sweetened yogurt. Full-fat cheese. Nondairy creamers. Whipped toppings. Processed cheese and cheese spreads. °Fats and oils °Butter. Stick margarine. Lard. Shortening. Ghee. Bacon fat. Tropical oils, such as coconut, palm kernel, or palm oil. °Seasonings and condiments °Onion salt, garlic salt, seasoned salt, table salt, and sea salt. Worcestershire sauce. Tartar sauce. Barbecue sauce. Teriyaki sauce. Soy sauce, including reduced-sodium. Steak sauce. Canned and packaged gravies. Fish sauce. Oyster sauce. Cocktail sauce. Store-bought horseradish. Ketchup. Mustard. Meat flavorings and tenderizers. Bouillon cubes. Hot sauces. Pre-made or packaged marinades. Pre-made or packaged taco seasonings. Relishes. Regular salad dressings. °Other foods °Salted popcorn and pretzels. °The items listed above may not be a complete list of foods and beverages you should avoid. Contact a dietitian for more information. °Where to find more information °National Heart, Lung, and Blood Institute: www.nhlbi.nih.gov °American Heart Association: www.heart.org °Academy of Nutrition and Dietetics: www.eatright.org °National Kidney Foundation: www.kidney.org °Summary °The DASH eating plan is a healthy eating plan that has been shown to reduce high blood pressure (hypertension). It may also reduce your risk for type 2 diabetes, heart disease, and stroke. °When on the DASH eating plan, aim to eat more fresh fruits and vegetables, whole grains, lean proteins, low-fat dairy, and heart-healthy fats. °With the DASH   eating plan, you should limit salt (sodium) intake to 2,300 mg a day. If you have hypertension, you may need to reduce your sodium intake to 1,500 mg a day. Work with your health care provider or dietitian to adjust your eating plan to your individual calorie needs. This information is not  intended to replace advice given to you by your health care provider. Make sure you discuss any questions you have with your health care provider. Document Revised: 05/23/2019 Document Reviewed: 05/23/2019 Elsevier Patient Education  2022 [place]. Steps to Quit Smoking Smoking tobacco is the leading cause of preventable death. It can affect almost every organ in the body. Smoking puts you and people around you at risk for many serious, long-lasting (chronic) diseases. Quitting smoking can be hard, but it is one of the best things that you can do for your health. It is never too late to quit. How do I get ready to quit? When you decide to quit smoking, make a plan to help you succeed. Before you quit: Pick a date to quit. Set a date within the next 2 weeks to give you time to prepare. Write down the reasons why you are quitting. Keep this list in places where you will see it often. Tell your family, friends, and co-workers that you are quitting. Their support is important. Talk with your doctor about the choices that may help you quit. Find out if your health insurance will pay for these treatments. Know the people, places, things, and activities that make you want to smoke (triggers). Avoid them. What first steps can I take to quit smoking? Throw away all cigarettes at home, at work, and in your car. Throw away the things that you use when you smoke, such as ashtrays and lighters. Clean your car. Make sure to empty the ashtray. Clean your home, including curtains and carpets. What can I do to help me quit smoking? Talk with your doctor about taking medicines and seeing a counselor at the same time. You are more likely to succeed when you do both. If you are pregnant or breastfeeding, talk with your doctor about counseling or other ways to quit smoking. Do not take medicine to help you quit smoking unless your doctor tells you to do so. To quit smoking: Quit right away Quit smoking  totally, instead of slowly cutting back on how much you smoke over a period of time. Go to counseling. You are more likely to quit if you go to counseling sessions regularly. Take medicine You may take medicines to help you quit. Some medicines need a prescription, and some you can buy over-the-counter. Some medicines may contain a drug called nicotine to replace the nicotine in cigarettes. Medicines may: Help you to stop having the desire to smoke (cravings). Help to stop the problems that come when you stop smoking (withdrawal symptoms). Your doctor may ask you to use: Nicotine patches, gum, or lozenges. Nicotine inhalers or sprays. Non-nicotine medicine that is taken by mouth. Find resources Find resources and other ways to help you quit smoking and remain smoke-free after you quit. These resources are most helpful when you use them often. They include: Online chats with a Social worker. Phone quitlines. Printed Furniture conservator/restorer. Support groups or group counseling. Text messaging programs. Mobile phone apps. Use apps on your mobile phone or tablet that can help you stick to your quit plan. There are many free apps for mobile phones and tablets as well as websites. Examples include Quit Guide  from the [facility] and smokefree.gov  What things can I do to make it easier to quit?  Talk to your family and friends. Ask them to support and encourage you. Call a phone quitline (1-800-QUIT-NOW), reach out to support groups, or work with a Social worker. Ask people who smoke to not smoke around you. Avoid places that make you want to smoke, such as: Bars. Parties. Smoke-break areas at work. Spend time with people who do not smoke. Lower the stress in your life. Stress can make you want to smoke. Try these things to help your stress: Getting regular exercise. Doing deep-breathing exercises. Doing yoga. Meditating. Doing a body scan. To do this, close your eyes, focus on one area of your body at a time  from head to toe. Notice which parts of your body are tense. Try to relax the muscles in those areas. How will I feel when I quit smoking? Day 1 to 3 weeks Within the first 24 hours, you may start to have some problems that come from quitting tobacco. These problems are very bad 2-3 days after you quit, but they do not often last for more than 2-3 weeks. You may get these symptoms: Mood swings. Feeling restless, nervous, angry, or annoyed. Trouble concentrating. Dizziness. Strong desire for high-sugar foods and nicotine. Weight gain. Trouble pooping (constipation). Feeling like you may vomit (nausea). Coughing or a sore throat. Changes in how the medicines that you take for other issues work in your body. Depression. Trouble sleeping (insomnia). Week 3 and afterward After the first 2-3 weeks of quitting, you may start to notice more positive results, such as: Better sense of smell and taste. Less coughing and sore throat. Slower heart rate. Lower blood pressure. Clearer skin. Better breathing. Fewer sick days. Quitting smoking can be hard. Do not give up if you fail the first time. Some people need to try a few times before they succeed. Do your best to stick to your quit plan, and talk with your doctor if you have any questions or concerns. Summary Smoking tobacco is the leading cause of preventable death. Quitting smoking can be hard, but it is one of the best things that you can do for your health. When you decide to quit smoking, make a plan to help you succeed. Quit smoking right away, not slowly over a period of time. When you start quitting, seek help from your doctor, family, or friends. This information is not intended to replace advice given to you by your health care provider. Make sure you discuss any questions you have with your health care provider. Document Revised: 02/25/2021 Document Reviewed: 09/07/2018 Elsevier Patient Education  [place].

## 2021-07-14 ENCOUNTER — Other Ambulatory Visit (HOSPITAL_COMMUNITY)
Admission: RE | Admit: 2021-07-14 | Discharge: 2021-07-14 | Disposition: A | Discharge status: Home / Self Care | Payer: 59 | Source: Ambulatory Visit | Attending: Family Medicine | Admitting: Family Medicine

## 2021-07-14 ENCOUNTER — Other Ambulatory Visit: Payer: Self-pay

## 2021-07-14 ENCOUNTER — Encounter: Payer: Self-pay | Admitting: Family Medicine

## 2021-07-14 DIAGNOSIS — Z125 Encounter for screening for malignant neoplasm of prostate: Secondary | ICD-10-CM | POA: Diagnosis not present

## 2021-07-14 DIAGNOSIS — R739 Hyperglycemia, unspecified: Secondary | ICD-10-CM | POA: Diagnosis not present

## 2021-07-14 DIAGNOSIS — E7800 Pure hypercholesterolemia, unspecified: Secondary | ICD-10-CM | POA: Diagnosis not present

## 2021-07-14 LAB — HEMOGLOBIN A1C
Hgb A1c MFr Bld: 5.7 % — ABNORMAL HIGH (ref 4.8–5.6)
Mean Plasma Glucose: 116.89 mg/dL

## 2021-07-14 LAB — BASIC METABOLIC PANEL
Anion gap: 6 (ref 5–15)
BUN: 19 mg/dL (ref 6–20)
CO2: 27 mmol/L (ref 22–32)
Calcium: 8.5 mg/dL — ABNORMAL LOW (ref 8.9–10.3)
Chloride: 104 mmol/L (ref 98–111)
Creatinine, Ser: 0.94 mg/dL (ref 0.61–1.24)
GFR, Estimated: >60 mL/min (ref >60)
Glucose, Bld: 121 mg/dL — ABNORMAL HIGH (ref 70–99)
Potassium: 4.3 mmol/L (ref 3.5–5.1)
Sodium: 137 mmol/L (ref 135–145)

## 2021-07-14 LAB — LIPID PANEL
Cholesterol: 108 mg/dL (ref 0–200)
HDL: 30 mg/dL — ABNORMAL LOW (ref >40)
LDL Cholesterol: 60 mg/dL (ref 0–99)
Total CHOL/HDL Ratio: 3.6 RATIO
Triglycerides: 89 mg/dL (ref <150)
VLDL: 18 mg/dL (ref 0–40)

## 2021-07-14 LAB — PSA: Prostatic Specific Antigen: 0.52 ng/mL (ref 0.00–4.00)

## 2021-07-18 ENCOUNTER — Other Ambulatory Visit: Payer: Self-pay | Admitting: Family Medicine

## 2021-07-18 DIAGNOSIS — Z1321 Encounter for screening for nutritional disorder: Secondary | ICD-10-CM

## 2021-07-26 ENCOUNTER — Other Ambulatory Visit (HOSPITAL_COMMUNITY): Payer: Self-pay

## 2021-07-26 MED ORDER — ZOSTER VAC RECOMB ADJUVANTED 50 MCG/0.5ML IM SUSR
INTRAMUSCULAR | 0 refills | Status: DC
  Filled 2021-08-12: qty 0.5, 1d supply, fill #0

## 2021-08-12 ENCOUNTER — Other Ambulatory Visit (HOSPITAL_COMMUNITY): Payer: Self-pay

## 2021-08-18 ENCOUNTER — Other Ambulatory Visit: Payer: Self-pay | Admitting: Student

## 2021-08-19 ENCOUNTER — Other Ambulatory Visit (HOSPITAL_COMMUNITY): Payer: Self-pay

## 2021-08-19 MED ORDER — SPIRONOLACTONE 25 MG PO TABS
ORAL_TABLET | Freq: Every day | ORAL | 3 refills | Status: DC
  Filled 2021-08-19: qty 45, 90d supply, fill #0
  Filled 2021-12-04: qty 45, 90d supply, fill #1
  Filled 2022-02-26: qty 45, 90d supply, fill #2

## 2021-10-02 ENCOUNTER — Other Ambulatory Visit (HOSPITAL_COMMUNITY): Payer: Self-pay

## 2021-10-03 ENCOUNTER — Other Ambulatory Visit (HOSPITAL_COMMUNITY): Payer: Self-pay

## 2021-10-30 ENCOUNTER — Other Ambulatory Visit: Payer: Self-pay | Admitting: Family Medicine

## 2021-10-31 ENCOUNTER — Other Ambulatory Visit (HOSPITAL_COMMUNITY): Payer: Self-pay

## 2021-10-31 MED ORDER — FUROSEMIDE 20 MG PO TABS
ORAL_TABLET | ORAL | 1 refills | Status: DC
  Filled 2021-10-31: qty 30, 30d supply, fill #0
  Filled 2022-01-25: qty 30, 30d supply, fill #1

## 2021-11-30 ENCOUNTER — Other Ambulatory Visit (HOSPITAL_COMMUNITY): Payer: Self-pay

## 2021-11-30 ENCOUNTER — Encounter: Payer: Self-pay | Admitting: Student

## 2021-11-30 ENCOUNTER — Ambulatory Visit: Payer: 59 | Admitting: Student

## 2021-11-30 VITALS — BP 142/92 | HR 74 | Ht 72.0 in | Wt 239.0 lb

## 2021-11-30 DIAGNOSIS — I255 Ischemic cardiomyopathy: Secondary | ICD-10-CM

## 2021-11-30 DIAGNOSIS — I10 Essential (primary) hypertension: Secondary | ICD-10-CM | POA: Diagnosis not present

## 2021-11-30 DIAGNOSIS — I2510 Atherosclerotic heart disease of native coronary artery without angina pectoris: Secondary | ICD-10-CM | POA: Diagnosis not present

## 2021-11-30 DIAGNOSIS — I6523 Occlusion and stenosis of bilateral carotid arteries: Secondary | ICD-10-CM | POA: Diagnosis not present

## 2021-11-30 DIAGNOSIS — E785 Hyperlipidemia, unspecified: Secondary | ICD-10-CM | POA: Diagnosis not present

## 2021-11-30 MED ORDER — ATORVASTATIN CALCIUM 80 MG PO TABS
ORAL_TABLET | Freq: Every day | ORAL | 3 refills | Status: DC
  Filled 2021-11-30: qty 90, 90d supply, fill #0
  Filled 2022-02-26: qty 90, 90d supply, fill #1
  Filled 2022-08-06: qty 90, 90d supply, fill #2
  Filled 2022-11-26: qty 90, 90d supply, fill #3

## 2021-11-30 NOTE — Progress Notes (Signed)
Cardiology Office Note    Date:  11/30/2021   ID:  Olanrewaju, Osborn 18-Dec-1960, MRN [id number]  PCP:  Kathyrn Drown, MD  Cardiologist: Previously Dr. Bronson Ing --> Needs to switch to new MD   Chief Complaint  Patient presents with   Follow-up    6 month visit    History of Present Illness:    [name] [name] is a 61 y.o. male with past medical history of CAD (s/p CABG in 12/2015 with LIMA-LAD, SVG-OM and SVG-PDA), HFmrEF/ICM (EF 20-25% in 12/2015, at 35-40% by repeat echo in 10/2016 and 50% in 03/2021), carotid artery stenosis (s/p L CEA in 02/2016 and R CEA in 03/2021), HTN, HLD, tobacco use and recent CVA (occurring in 03/2021) who presents to the office today for 5-monthfollow-up.  He was last examined by myself in 05/2021 and denied any recent anginal symptoms. He did report still having mild peripheral vision loss since his recent CVA but had been cleared to drive by Ophthalmology. He was continued on his current cardiac medications including ASA 81 mg daily, Atorvastatin 80 mg daily, Coreg 12.5 mg twice daily, Lisinopril 10 mg daily and Spironolactone 12.5 mg daily.  In talking with the patient today, he reports overall feeling well since his last office visit. He remains active at baseline as [place] [employer] also [place] and fishing at home. He denies any recent chest pain or dyspnea on exertion. No recent orthopnea, PND or pitting edema. He reports his work today has been stressful and BP was initially recorded at 172/102, rechecked and at 142/92. He says this has been well controlled when checked at home. He has reduced his tobacco use and is now smoking 0.5 ppd.   Past Medical History:  Diagnosis Date   Acute systolic congestive heart failure (HCC)    Atrial fibrillation with rapid ventricular response ([facility] 12/13/2015   a. isolated episode in 2017 --> no recurrence by monitors   Cardiomyopathy ([facility] 12/13/2015    Cardiomyopathy, ischemic 12/14/2015   a. EF 20-25% in 12/2015 b. EF at 35-40% by repeat echo in 10/2016   Coronary artery disease involving native coronary artery with unstable angina pectoris ([facility] 12/15/2015   Hypercholesterolemia 12/15/2015   Mitral regurgitation 12/14/2015   moderate   NSTEMI (non-ST elevated myocardial infarction) (HCC)    PONV (postoperative nausea and vomiting)    "sick with very first surgery but nothing since" in 1990s   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 12/17/2015   LIMA to LAD, SVG to OM, SVG to PDA, EVH via right thigh with clipping of LA appendage   Stroke ([facility]    Tobacco abuse     Past Surgical History:  Procedure Laterality Date   CARDIAC CATHETERIZATION N/A 12/15/2015   Procedure: Right/Left Heart Cath and Coronary Angiography;  Surgeon: MWellington Hampshire MD;  [facility];  Service: Cardiovascular;  Laterality: N/A;   CLIPPING OF ATRIAL APPENDAGE Left 12/17/2015   Procedure: CLIPPING OF ATRIAL APPENDAGE;  Surgeon: CRexene Alberts MD;  [facility]  Service: Open Heart Surgery;  Laterality: Left;   COLONOSCOPY WITH PROPOFOL N/A 06/21/2016   Procedure: COLONOSCOPY WITH PROPOFOL;  Surgeon: SManus Gunning MD;  [facility];  Service: Gastroenterology;  Laterality: N/A;   CORONARY ARTERY BYPASS GRAFT N/A 12/17/2015   Procedure: CORONARY ARTERY BYPASS GRAFTING (CABG) x three , using left internal mammary artery and right leg  greater saphenous vein;  Surgeon: [name], MD;  Location: [facility];  Service: Open Heart Surgery;  Laterality: N/A;   ENDARTERECTOMY Left 02/09/2016   Procedure: LEFT CAROTID ENDARTERECTOMY;  Surgeon: Serafina Mitchell, MD;  Location: [facility];  Service: Vascular;  Laterality: Left;   ENDARTERECTOMY Right 03/11/2021   Procedure: RIGHT CAROTID ENDARTERECTOMY;  Surgeon: Serafina Mitchell, MD;  Location: [facility];  Service: Vascular;  Laterality: Right;   KNEE ARTHROSCOPY Right 1991   PATCH ANGIOPLASTY  Left 02/09/2016   Procedure: PATCH ANGIOPLASTY USING Rueben Bash BIOLOGIC PATCH;  Surgeon: Serafina Mitchell, MD;  Location: [facility];  Service: Vascular;  Laterality: Left;   PATCH ANGIOPLASTY Right 03/11/2021   Procedure: PATCH ANGIOPLASTY;  Surgeon: Serafina Mitchell, MD;  [facility];  Service: Vascular;  Laterality: Right;    Current Medications: Outpatient Medications Prior to Visit  Medication Sig Dispense Refill   aspirin EC 81 MG tablet Take 1 tablet (81 mg total) by mouth daily. Swallow whole. 30 tablet 11   carvedilol (COREG) 12.5 MG tablet Take 1 tablet (12.5 mg total) by mouth 2 (two) times daily. 180 tablet 3   furosemide (LASIX) 20 MG tablet Take 1 tablet by mouth every morning for the next 3 days then 1 every morning as needed edema 30 tablet 1   lisinopril (ZESTRIL) 20 MG tablet Take 1 tablet (20 mg total) by mouth daily. 90 tablet 3   spironolactone (ALDACTONE) 25 MG tablet TAKE 1/2 TABLET BY MOUTH DAILY 45 tablet 3   atorvastatin (LIPITOR) 80 MG tablet TAKE 1 TABLET BY MOUTH DAILY. 90 tablet 3   doxycycline (VIBRA-TABS) 100 MG tablet Take 1 tablet (100 mg total) by mouth 2 (two) times daily. 20 tablet 0   Zoster Vaccine Adjuvanted [facility]) injection To be administered by the pharmacist 0.5 mL 1   Zoster Vaccine Adjuvanted [facility]) injection Inject into the muscle. 0.5 mL 0   No facility-administered medications prior to visit.     Allergies:   Patient has no known allergies.   Social History   Socioeconomic History   Marital status: Married    Spouse name: Not on file   Number of children: 2   Years of education: Not on file   Highest education level: Not on file  Occupational History   Occupation: Maintanence    Employer: [employer]    Comment: [employer]  Tobacco Use   Smoking status: Some Days    Packs/day: 1.00    Years: 35.00    Pack years: 35.00    Types: Cigarettes    Last attempt to quit: 12/13/2015    Years since quitting: 5.9   Smokeless tobacco: Former     Quit date: 12/13/2015  Vaping Use   Vaping Use: Never used  Substance and Sexual Activity   Alcohol use: Not Currently    Comment: rare   Drug use: No   Sexual activity: Yes  Other Topics Concern   Not on file  Social History Narrative   Not on file   Social Determinants of Health   Financial Resource Strain: Not on file  Food Insecurity: Not on file  Transportation Needs: Not on file  Physical Activity: Not on file  Stress: Not on file  Social Connections: Not on file     Family History:  The patient's family history includes Atrial fibrillation in his father; Heart attack in his paternal grandfather; Heart failure in his father.   Review of Systems:  Please see the history of present illness.     All other systems reviewed and are otherwise negative except as noted above.   Physical Exam:    VS:  BP (!) 142/92   Pulse 74   Ht 6' (1.829 m)   Wt 239 lb (108.4 kg)   SpO2 99%   BMI 32.41 kg/m    General: Well developed, well nourished,male appearing in no acute distress. Head: Normocephalic, atraumatic. Neck: No carotid bruits. JVD not elevated.  Lungs: Respirations regular and unlabored, without wheezes or rales.  Heart: Regular rate and rhythm. No S3 or S4.  No murmur, no rubs, or gallops appreciated. Abdomen: Appears non-distended. No obvious abdominal masses. Msk:  Strength and tone appear normal for age. No obvious joint deformities or effusions. Extremities: No clubbing or cyanosis. No pitting edema.  Distal pedal pulses are 2+ bilaterally. Neuro: Alert and oriented X 3. Moves all extremities spontaneously. No focal deficits noted. Psych:  Responds to questions appropriately with a normal affect. Skin: No rashes or lesions noted  Wt Readings from Last 3 Encounters:  11/30/21 239 lb (108.4 kg)  06/30/21 236 lb 12.8 oz (107.4 kg)  05/24/21 235 lb (106.6 kg)     Studies/Labs Reviewed:   EKG:  EKG is not ordered today.    Recent Labs: 02/01/2021: B  Natriuretic Peptide 90.0 03/11/2021: ALT 23 03/12/2021: Hemoglobin 15.0; Platelets 246 07/14/2021: BUN 19; Creatinine, Ser 0.94; Potassium 4.3; Sodium 137   Lipid Panel    Component Value Date/Time   CHOL 108 07/14/2021 0804   TRIG 89 07/14/2021 0804   HDL 30 (L) 07/14/2021 0804   CHOLHDL 3.6 07/14/2021 0804   VLDL 18 07/14/2021 0804   LDLCALC 60 07/14/2021 0804    Additional studies/ records that were reviewed today include:   R/LHC: 12/2015 There is moderate to severe left ventricular systolic dysfunction. Ost LM to LM lesion, 70% stenosed. Ost RCA lesion, 100% stenosed. Mid LAD lesion, 100% stenosed. Prox LAD to Mid LAD lesion, 90% stenosed. Ost Cx to Prox Cx lesion, 70% stenosed. Mid Cx lesion, 80% stenosed.   1. Significant left main and severe three-vessel coronary artery disease with ostial occlusion of the right coronary artery and occlusion of the mid LAD. 2. Moderately to severely reduced LV systolic function with an ejection fraction of 30-35%. 3. Right heart catheterization showed mild pulmonary hypertension, moderately elevated filling pressures and normal cardiac output.   Recommendations: CABG. I left a message to cardiothoracic surgery. Please note that there was severe pressure dampening every time I engaged the left main coronary artery with PVCs on the monitor. Thus, I only took 3 shots. The right coronary artery appears to be flush occluded at the ostium and could not be engaged. Collaterals are seen from the left side all the way back to the proximal RCA. The LAD is occluded in the midsegment with only faint collaterals and might not be suitable for grafting. However, there are good targets in the diagonal, left circumflex and right coronary artery.  Echocardiogram: 03/2021 IMPRESSIONS     1. Left ventricular ejection fraction, by estimation, is 50%. The left  ventricle has low normal function. Left ventricular endocardial border not  optimally defined to  evaluate regional wall motion. The left ventricular  internal cavity size was  moderately dilated. Left ventricular diastolic parameters are consistent  with Grade I diastolic dysfunction (impaired relaxation).   2. Right ventricular systolic function is normal. The right ventricular  size is mildly enlarged. There  is normal pulmonary artery systolic  pressure.   3. Left atrial size was moderately dilated.   4. The mitral valve is normal in structure. Mild mitral valve  regurgitation. No evidence of mitral stenosis.   5. The aortic valve is tricuspid. Aortic valve regurgitation is not  visualized. Mild to moderate aortic valve sclerosis/calcification is  present, without any evidence of aortic stenosis. Aortic valve area, by  VTI measures 2.20 cm. Aortic valve mean  gradient measures 4.0 mmHg. Aortic valve Vmax measures 1.29 m/s.   6. The inferior vena cava is normal in size with greater than 50%  respiratory variability, suggesting right atrial pressure of 3 mmHg.   7. Recommend limited echo with definity to assess LV Apex.   Event Monitor: 04/2021 ZIO XT reviewed.  13 days, 15 hours analyzed.  Predominant rhythm is sinus with heart rate ranging from 53 bpm up to 118 bpm and average heart rate 77 bpm.  There were rare PACs including couplets and triplets representing less than 1% total beats.  There were rare PVCs including couplets and triplets representing less than 1% total beats.  Limited episodes of ventricular bigeminy and trigeminy were noted.  There were 7 episodes of PSVT, the longest of which lasted for 18 beats with average heart rate 115.  No sustained events were noted and there were no pauses.  Assessment:    1. Coronary artery disease involving native coronary artery of native heart without angina pectoris   2. Ischemic cardiomyopathy   3. Bilateral carotid artery stenosis   4. Essential hypertension   5. Hyperlipidemia LDL goal <70      Plan:   In order of problems  listed above:  1. CAD - He is s/p CABG in 12/2015 with LIMA-LAD, SVG-OM and SVG-PDA. He remains active at baseline and denies any recent anginal symptoms. Continue current medical therapy with ASA 81 mg daily, Atorvastatin 80 mg daily, Coreg 12.5 mg twice daily and Lisinopril 20 mg daily.   2. HFmrEF/ICM - His EF was previously at 20-25% in 12/2015, at 35-40% by repeat echo in 10/2016 and 50% in 03/2021.  He denies any recent orthopnea, PND or pitting edema and appears euvolemic on examination today. Continue current medical therapy with Coreg, Lisinopril and Spironolactone. He only takes Lasix as needed and has only utilized this twice within the past several months. His creatinine was stable at 0.94 by recent labs in 07/2021.  3. Carotid Artery Stenosis  - He is s/p L CEA in 02/2016 and R CEA in 03/2021. He is planning to schedule routine follow-up with Vascular Surgery for later this summer. Continue ASA 81 mg daily and Atorvastatin 80 mg daily.  4. HTN - His BP was initially elevated at 172/102 upon arrival to the clinic, rechecked and improved to 142/92. He reports his BP has been well controlled when checked at home and I encouraged him to continue to follow this and report back if numbers remain elevated. Continue current medical therapy for now with Coreg 12.5 mg twice daily, Lisinopril 20 mg daily and Spironolactone 12.5 mg daily.  5. HLD - His LDL was at 60 in 07/2021. Continue Atorvastatin 80 mg daily.    Medication Adjustments/Labs and Tests Ordered: Current medicines are reviewed at length with the patient today.  Concerns regarding medicines are outlined above.  Medication changes, Labs and Tests ordered today are listed in the Patient Instructions below. Patient Instructions  Medication Instructions:   Continue current medication regimen.   *If  you need a refill on your cardiac medications before your next appointment, please call your  pharmacy*   Testing/Procedures:  None   Follow-Up: At [facility], you and your health needs are our priority.  As part of our continuing mission to provide you with exceptional heart care, we have created designated Provider Care Teams.  These Care Teams include your primary Cardiologist (physician) and Advanced Practice Providers (APPs -  Physician Assistants and Nurse Practitioners) who all work together to provide you with the care you need, when you need it.  We recommend signing up for the patient portal called "MyChart".  Sign up information is provided on this After Visit Summary.  MyChart is used to connect with patients for Virtual Visits (Telemedicine).  Patients are able to view lab/test results, encounter notes, upcoming appointments, etc.  Non-urgent messages can be sent to your provider as well.   To learn more about what you can do with MyChart, go to [email or web address].    Your next appointment:   6 months with Bernerd Pho, PA  If primary card or EP is not listed click here to update    :1}    Important Information About Sugar         Signed, Erma Heritage, PA-C  11/30/2021 4:59 PM    [place] [address] [place], [place] [address] Phone: [phone] Fax: [phone]

## 2021-11-30 NOTE — Patient Instructions (Signed)
Medication Instructions:   Continue current medication regimen.   *If you need a refill on your cardiac medications before your next appointment, please call your pharmacy*   Testing/Procedures:  None   Follow-Up: At [facility], you and your health needs are our priority.  As part of our continuing mission to provide you with exceptional heart care, we have created designated Provider Care Teams.  These Care Teams include your primary Cardiologist (physician) and Advanced Practice Providers (APPs -  Physician Assistants and Nurse Practitioners) who all work together to provide you with the care you need, when you need it.  We recommend signing up for the patient portal called "MyChart".  Sign up information is provided on this After Visit Summary.  MyChart is used to connect with patients for Virtual Visits (Telemedicine).  Patients are able to view lab/test results, encounter notes, upcoming appointments, etc.  Non-urgent messages can be sent to your provider as well.   To learn more about what you can do with MyChart, go to [email or web address].    Your next appointment:   6 months with Bernerd Pho, PA  If primary card or EP is not listed click here to update    :1}    Important Information About Sugar

## 2021-12-05 ENCOUNTER — Other Ambulatory Visit (HOSPITAL_COMMUNITY): Payer: Self-pay

## 2021-12-12 ENCOUNTER — Other Ambulatory Visit (HOSPITAL_COMMUNITY): Payer: Self-pay

## 2022-01-16 ENCOUNTER — Other Ambulatory Visit (HOSPITAL_COMMUNITY): Payer: Self-pay

## 2022-01-25 ENCOUNTER — Other Ambulatory Visit (HOSPITAL_COMMUNITY): Payer: Self-pay

## 2022-02-27 ENCOUNTER — Other Ambulatory Visit (HOSPITAL_COMMUNITY): Payer: Self-pay

## 2022-04-29 ENCOUNTER — Other Ambulatory Visit: Payer: Self-pay | Admitting: Physician Assistant

## 2022-05-01 ENCOUNTER — Other Ambulatory Visit (HOSPITAL_COMMUNITY): Payer: Self-pay

## 2022-05-01 MED ORDER — CARVEDILOL 12.5 MG PO TABS
12.5000 mg | ORAL_TABLET | Freq: Two times a day (BID) | ORAL | 3 refills | Status: DC
  Filled 2022-05-01: qty 180, 90d supply, fill #0
  Filled 2022-08-06: qty 180, 90d supply, fill #1
  Filled 2022-11-26: qty 180, 90d supply, fill #2
  Filled 2023-02-25: qty 180, 90d supply, fill #3

## 2022-05-04 ENCOUNTER — Other Ambulatory Visit (HOSPITAL_COMMUNITY): Payer: Self-pay

## 2022-05-04 ENCOUNTER — Encounter (HOSPITAL_COMMUNITY): Payer: Self-pay

## 2022-05-09 ENCOUNTER — Other Ambulatory Visit (HOSPITAL_COMMUNITY): Payer: Self-pay

## 2022-05-31 DIAGNOSIS — H5213 Myopia, bilateral: Secondary | ICD-10-CM | POA: Diagnosis not present

## 2022-06-01 ENCOUNTER — Ambulatory Visit: Payer: 59 | Attending: Student | Admitting: Student

## 2022-06-01 ENCOUNTER — Encounter: Payer: Self-pay | Admitting: Student

## 2022-06-01 ENCOUNTER — Other Ambulatory Visit (HOSPITAL_COMMUNITY): Payer: Self-pay

## 2022-06-01 VITALS — BP 142/84 | HR 72 | Ht 72.0 in | Wt 233.6 lb

## 2022-06-01 DIAGNOSIS — E785 Hyperlipidemia, unspecified: Secondary | ICD-10-CM | POA: Diagnosis not present

## 2022-06-01 DIAGNOSIS — I255 Ischemic cardiomyopathy: Secondary | ICD-10-CM | POA: Diagnosis not present

## 2022-06-01 DIAGNOSIS — Z131 Encounter for screening for diabetes mellitus: Secondary | ICD-10-CM | POA: Diagnosis not present

## 2022-06-01 DIAGNOSIS — Z79899 Other long term (current) drug therapy: Secondary | ICD-10-CM

## 2022-06-01 DIAGNOSIS — I6523 Occlusion and stenosis of bilateral carotid arteries: Secondary | ICD-10-CM | POA: Diagnosis not present

## 2022-06-01 DIAGNOSIS — I10 Essential (primary) hypertension: Secondary | ICD-10-CM | POA: Diagnosis not present

## 2022-06-01 DIAGNOSIS — I2510 Atherosclerotic heart disease of native coronary artery without angina pectoris: Secondary | ICD-10-CM | POA: Diagnosis not present

## 2022-06-01 MED ORDER — SPIRONOLACTONE 25 MG PO TABS
12.5000 mg | ORAL_TABLET | Freq: Every day | ORAL | 3 refills | Status: DC
  Filled 2022-06-01: qty 45, 90d supply, fill #0
  Filled 2022-08-28: qty 45, 90d supply, fill #1
  Filled 2022-11-26: qty 45, 90d supply, fill #2
  Filled 2023-02-25: qty 45, 90d supply, fill #3

## 2022-06-01 MED ORDER — LISINOPRIL 20 MG PO TABS
20.0000 mg | ORAL_TABLET | Freq: Every day | ORAL | 3 refills | Status: DC
  Filled 2022-06-01 – 2022-08-06 (×2): qty 90, 90d supply, fill #0
  Filled 2022-11-26: qty 90, 90d supply, fill #1
  Filled 2023-02-25: qty 90, 90d supply, fill #2
  Filled 2023-05-26: qty 90, 90d supply, fill #3

## 2022-06-01 NOTE — Progress Notes (Signed)
Cardiology Office Note    Date:  06/01/2022   ID:  Elmin, Wiederholt 1961/05/19, MRN [id number]  PCP:  Kathyrn Drown, MD  Cardiologist: Previously Dr. Bronson Ing  Chief Complaint  Patient presents with   Follow-up    6 month visit    History of Present Illness:    [name] [name] is a 61 y.o. male  with past medical history of CAD (s/p CABG in 12/2015 with LIMA-LAD, SVG-OM and SVG-PDA), HFmrEF/ICM (EF 20-25% in 12/2015, at 35-40% by repeat echo in 10/2016 and 50% in 03/2021), carotid artery stenosis (s/p L CEA in 02/2016 and R CEA in 03/2021), HTN, HLD and prior CVA (occurring in 03/2021) who presents to the office today for 43-monthfollow-up.  He was last examined by myself in 10/2021 and reported overall remaining active at work and home and denied any recent anginal symptoms. He had reduced his tobacco use to less than 0.5 packs/day. He was continued on his current cardiac medications including ASA 81 mg daily, Atorvastatin 80 mg daily, Coreg 12.5 mg twice daily, Lisinopril 20 mg daily and Spironolactone 12.5 mg daily.  In talking with the patient today, he reports overall doing well since his last office visit. He denies any recent chest pain or dyspnea on exertion. No recent orthopnea, PND or pitting edema. He does experience easy bruising with ASA. His blood pressure was initially recorded at 160/100 today, rechecked and at 142/84. He does follow his blood pressure at home and says his systolic readings are typically in the 120's to 130's. He has a prescription for Lasix but has not needed to utilize this in over 6 months.   Past Medical History:  Diagnosis Date   Acute systolic congestive heart failure (HCC)    Atrial fibrillation with rapid ventricular response ([facility] 12/13/2015   a. isolated episode in 2017 --> no recurrence by monitors   Cardiomyopathy ([facility] 12/13/2015   Cardiomyopathy, ischemic 12/14/2015   a. EF 20-25% in 12/2015 b. EF at 35-40% by repeat echo in 10/2016  c. EF 50% in 03/2021   Coronary artery disease involving native coronary artery with unstable angina pectoris ([facility] 12/15/2015   Hypercholesterolemia 12/15/2015   Mitral regurgitation 12/14/2015   moderate   NSTEMI (non-ST elevated myocardial infarction) ([facility]    PONV (postoperative nausea and vomiting)    "sick with very first surgery but nothing since" in 1990s   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 12/17/2015   LIMA to LAD, SVG to OM, SVG to PDA, EVH via right thigh with clipping of LA appendage   Stroke ([facility]    Tobacco abuse     Past Surgical History:  Procedure Laterality Date   CARDIAC CATHETERIZATION N/A 12/15/2015   Procedure: Right/Left Heart Cath and Coronary Angiography;  Surgeon: MWellington Hampshire MD;  [facility];  Service: Cardiovascular;  Laterality: N/A;   CLIPPING OF ATRIAL APPENDAGE Left 12/17/2015   Procedure: CLIPPING OF ATRIAL APPENDAGE;  Surgeon: CRexene Alberts MD;  [facility]  Service: Open Heart Surgery;  Laterality: Left;   COLONOSCOPY WITH PROPOFOL N/A 06/21/2016   Procedure: COLONOSCOPY WITH PROPOFOL;  Surgeon: SManus Gunning MD;  [facility];  Service: Gastroenterology;  Laterality: N/A;   CORONARY ARTERY BYPASS GRAFT N/A 12/17/2015   Procedure: CORONARY ARTERY BYPASS GRAFTING (CABG) x three , using left internal mammary artery and right leg greater saphenous vein;  Surgeon: CRexene Alberts MD;  [facility]  Service: Open Heart Surgery;  Laterality: N/A;   ENDARTERECTOMY Left 02/09/2016   Procedure: LEFT CAROTID ENDARTERECTOMY;  Surgeon: Serafina Mitchell, MD;  Location: [facility];  Service: Vascular;  Laterality: Left;   ENDARTERECTOMY Right 03/11/2021   Procedure: RIGHT CAROTID ENDARTERECTOMY;  Surgeon: Serafina Mitchell, MD;  Location: [facility];  Service: Vascular;  Laterality: Right;   KNEE ARTHROSCOPY Right 1991   PATCH ANGIOPLASTY Left 02/09/2016   Procedure: PATCH ANGIOPLASTY USING Rueben Bash BIOLOGIC PATCH;   Surgeon: Serafina Mitchell, MD;  Location: [facility];  Service: Vascular;  Laterality: Left;   PATCH ANGIOPLASTY Right 03/11/2021   Procedure: PATCH ANGIOPLASTY;  Surgeon: Serafina Mitchell, MD;  Location[facility];  Service: Vascular;  Laterality: Right;    Current Medications: Outpatient Medications Prior to Visit  Medication Sig Dispense Refill   aspirin EC 81 MG tablet Take 1 tablet (81 mg total) by mouth daily. Swallow whole. 30 tablet 11   atorvastatin (LIPITOR) 80 MG tablet TAKE 1 TABLET BY MOUTH DAILY. 90 tablet 3   carvedilol (COREG) 12.5 MG tablet Take 1 tablet (12.5 mg total) by mouth 2 (two) times daily. 180 tablet 3   furosemide (LASIX) 20 MG tablet Take 1 tablet by mouth every morning for the next 3 days then 1 every morning as needed edema 30 tablet 1   lisinopril (ZESTRIL) 20 MG tablet Take 1 tablet (20 mg total) by mouth daily. 90 tablet 3   spironolactone (ALDACTONE) 25 MG tablet TAKE 1/2 TABLET BY MOUTH DAILY 45 tablet 3   No facility-administered medications prior to visit.     Allergies:   Patient has no known allergies.   Social History   Socioeconomic History   Marital status: Married    Spouse name: Not on file   Number of children: 2   Years of education: Not on file   Highest education level: Not on file  Occupational History   Occupation: Maintanence    Employer: [employer]    Comment: [employer]  Tobacco Use   Smoking status: Some Days    Packs/day: 0.50    Years: 35.00    Total pack years: 17.50    Types: Cigarettes    Last attempt to quit: 12/13/2015    Years since quitting: 6.4   Smokeless tobacco: Former    Quit date: 12/13/2015  Vaping Use   Vaping Use: Never used  Substance and Sexual Activity   Alcohol use: Not Currently    Comment: rare   Drug use: No   Sexual activity: Yes  Other Topics Concern   Not on file  Social History Narrative   Not on file   Social Determinants of Health   Financial Resource Strain: Not on file  Food Insecurity: Not on  file  Transportation Needs: Not on file  Physical Activity: Not on file  Stress: Not on file  Social Connections: Not on file     Family History:  The patient's family history includes Atrial fibrillation in his father; Heart attack in his paternal grandfather; Heart failure in his father.   Review of Systems:    Please see the history of present illness.     All other systems reviewed and are otherwise negative except as noted above.   Physical Exam:    VS:  BP (!) 142/84   Pulse 72   Ht 6' (1.829 m)   Wt 233 lb 9.6 oz (106 kg)   SpO2 100%   BMI 31.68 kg/m  General: Pleasant male appearing in no acute distress. Head: Normocephalic, atraumatic. Neck: No carotid bruits. JVD not elevated.  Lungs: Respirations regular and unlabored, without wheezes or rales.  Heart: Regular rate and rhythm. No S3 or S4.  No murmur, no rubs, or gallops appreciated. Abdomen: Appears non-distended. No obvious abdominal masses. Msk:  Strength and tone appear normal for age. No obvious joint deformities or effusions. Extremities: No clubbing or cyanosis. No pitting edema.  Distal pedal pulses are 2+ bilaterally. Neuro: Alert and oriented X 3. Moves all extremities spontaneously. No focal deficits noted. Psych:  Responds to questions appropriately with a normal affect. Skin: No rashes or lesions noted  Wt Readings from Last 3 Encounters:  06/01/22 233 lb 9.6 oz (106 kg)  11/30/21 239 lb (108.4 kg)  06/30/21 236 lb 12.8 oz (107.4 kg)     Studies/Labs Reviewed:   EKG:  EKG is ordered today. The ekg ordered today demonstrates normal sinus rhythm, heart rate 69 with anterior infarct pattern and ST abnormalities along the lateral leads which are overall similar to prior tracings. No acute changes.  Recent Labs: 07/14/2021: BUN 19; Creatinine, Ser 0.94; Potassium 4.3; Sodium 137   Lipid Panel    Component Value Date/Time   CHOL 108 07/14/2021 0804   TRIG 89 07/14/2021 0804   HDL 30 (L)  07/14/2021 0804   CHOLHDL 3.6 07/14/2021 0804   VLDL 18 07/14/2021 0804   LDLCALC 60 07/14/2021 0804    Additional studies/ records that were reviewed today include:   Echocardiogram: 03/2021 IMPRESSIONS     1. Left ventricular ejection fraction, by estimation, is 50%. The left  ventricle has low normal function. Left ventricular endocardial border not  optimally defined to evaluate regional wall motion. The left ventricular  internal cavity size was  moderately dilated. Left ventricular diastolic parameters are consistent  with Grade I diastolic dysfunction (impaired relaxation).   2. Right ventricular systolic function is normal. The right ventricular  size is mildly enlarged. There is normal pulmonary artery systolic  pressure.   3. Left atrial size was moderately dilated.   4. The mitral valve is normal in structure. Mild mitral valve  regurgitation. No evidence of mitral stenosis.   5. The aortic valve is tricuspid. Aortic valve regurgitation is not  visualized. Mild to moderate aortic valve sclerosis/calcification is  present, without any evidence of aortic stenosis. Aortic valve area, by  VTI measures 2.20 cm. Aortic valve mean  gradient measures 4.0 mmHg. Aortic valve Vmax measures 1.29 m/s.   6. The inferior vena cava is normal in size with greater than 50%  respiratory variability, suggesting right atrial pressure of 3 mmHg.   7. Recommend limited echo with definity to assess LV Apex.    Event Monitor: 04/2021 ZIO XT reviewed. 13 days, 15 hours analyzed. Predominant rhythm is sinus with heart rate ranging from 53 bpm up to 118 bpm and average heart rate 77 bpm. There were rare PACs including couplets and triplets representing less than 1% total beats. There were rare PVCs including couplets and triplets representing less than 1% total beats. Limited episodes of ventricular bigeminy and trigeminy were noted. There were 7 episodes of PSVT, the longest of which lasted for  18 beats with average heart rate 115. No sustained events were noted and there were no pauses.   Assessment:    1. Coronary artery disease involving native coronary artery of native heart without angina pectoris   2. Ischemic cardiomyopathy   3. Bilateral carotid  artery stenosis   4. Essential hypertension   5. Hyperlipidemia LDL goal <70   6. Screening for diabetes mellitus (DM)   7. Medication management      Plan:   In order of problems listed above:  1. CAD - He underwent CABG in 12/2015 with LIMA-LAD, SVG-OM and SVG-PDA. He remains active at baseline denies any recent anginal symptoms. Continue current medical therapy with ASA 81 mg daily, Atorvastatin 80 mg daily and Coreg 12.5 mg twice daily. Given the timeframe since his most recent labs, will recheck a CBC, CMET, FLP and Hgb A1c.   2. HFmrEF/ICM - EF was previously reduced to 20-25% in 12/2015, at 35-40% by repeat echo in 10/2016 and 50% in 03/2021.  - He appears euvolemic on examination today and has not required the use of Lasix recently. Continue Coreg 12.5 mg twice daily, Lisinopril 20 mg daily and Spironolactone 12.5 mg daily.   3. Carotid Artery Stenosis  - He is s/p L CEA in 02/2016 and R CEA in 03/2021. He has not been evaluated by Vascular Surgery recently and says he was previously told by Dr. Trula Slade he would not need carotid dopplers for several years. He does not have a bruit on exam but I will send a message to Vascular Surgery to verify this. - Continue ASA 81 mg daily and Atorvastatin 80 mg daily.   4. HTN - BP was initially at 160/100 during today's visit, rechecked and improved to 142/84. He has been under increased stress while at work and came directly from work to his visit today.  Reports readings have overall been well controlled when checked at home. I encouraged him to reach out to make [place] aware if he starts to notice elevated readings in the ambulatory setting.  Continue current medical therapy for now  with Coreg 12.5 mg twice daily, Lisinopril 20 mg daily and Spironolactone 12.5 mg daily. If BP remains above goal, would plan to titrate Lisinopril to 40 mg daily.   5. HLD - His LDL was at 60 in 07/2021. Remains on Atorvastatin '80mg'$  daily. Will plan to obtain a follow-up FLP and LFTs.    Medication Adjustments/Labs and Tests Ordered: Current medicines are reviewed at length with the patient today.  Concerns regarding medicines are outlined above.  Medication changes, Labs and Tests ordered today are listed in the Patient Instructions below. Patient Instructions  Medication Instructions:  Your physician recommends that you continue on your current medications as directed. Please refer to the Current Medication list given to you today.  *If you need a refill on your cardiac medications before your next appointment, please call your pharmacy*   Lab Work: Your physician recommends that you return for lab work in: [place]   If you have labs (blood work) drawn today and your tests are completely normal, you will receive your results only by: [place] (if you have [place]) OR A paper copy in the mail If you have any lab test that is abnormal or we need to change your treatment, we will call you to review the results.   Testing/Procedures: NONE    Follow-Up: At [facility], you and your health needs are our priority.  As part of our continuing mission to provide you with exceptional heart care, we have created designated Provider Care Teams.  These Care Teams include your primary Cardiologist (physician) and Advanced Practice Providers (APPs -  Physician Assistants and Nurse Practitioners) who all work together to provide you with the  care you need, when you need it.  We recommend signing up for the patient portal called "MyChart".  Sign up information is provided on this After Visit Summary.  MyChart is used to connect with patients for Virtual Visits (Telemedicine).   Patients are able to view lab/test results, encounter notes, upcoming appointments, etc.  Non-urgent messages can be sent to your provider as well.   To learn more about what you can do with MyChart, go to [email or web address].    Your next appointment:   1 year(s)  The format for your next appointment:   In Person  Provider:   Bernerd Pho, PA-C    Other Instructions Thank you for choosing [facility]!    Important Information About Sugar         Signed, [name] Heritage, PA-C  06/01/2022 4:11 PM    [facility] [address] [place], [place] [address] Phone: [phone] Fax: [phone]

## 2022-06-01 NOTE — Patient Instructions (Signed)
Medication Instructions:  Your physician recommends that you continue on your current medications as directed. Please refer to the Current Medication list given to you today.  *If you need a refill on your cardiac medications before your next appointment, please call your pharmacy*   Lab Work: Your physician recommends that you return for lab work in: [place]   If you have labs (blood work) drawn today and your tests are completely normal, you will receive your results only by: [place] (if you have [place]) OR A paper copy in the mail If you have any lab test that is abnormal or we need to change your treatment, we will call you to review the results.   Testing/Procedures: NONE    Follow-Up: At [facility], you and your health needs are our priority.  As part of our continuing mission to provide you with exceptional heart care, we have created designated Provider Care Teams.  These Care Teams include your primary Cardiologist (physician) and Advanced Practice Providers (APPs -  Physician Assistants and Nurse Practitioners) who all work together to provide you with the care you need, when you need it.  We recommend signing up for the patient portal called "MyChart".  Sign up information is provided on this After Visit Summary.  MyChart is used to connect with patients for Virtual Visits (Telemedicine).  Patients are able to view lab/test results, encounter notes, upcoming appointments, etc.  Non-urgent messages can be sent to your provider as well.   To learn more about what you can do with MyChart, go to [email or web address].    Your next appointment:   1 year(s)  The format for your next appointment:   In Person  Provider:   Bernerd Pho, PA-C    Other Instructions Thank you for choosing [facility]!    Important Information About Sugar

## 2022-06-07 ENCOUNTER — Telehealth: Payer: Self-pay | Admitting: Student

## 2022-06-07 NOTE — Telephone Encounter (Signed)
   Please let Mr. Cochrane know that I did hear back from the Vascular Surgery group and they recommended yearly follow-up for his carotids. He can either call them to get this scheduled with Dr. Trula Slade or we can arrange for carotid dopplers here at [facility] and follow-up with them afterwards. Whichever he prefers.   Thanks,  [place]

## 2022-06-07 NOTE — Telephone Encounter (Signed)
Pt notified and agrees with plan of care.

## 2022-06-29 ENCOUNTER — Other Ambulatory Visit (HOSPITAL_COMMUNITY)
Admission: RE | Admit: 2022-06-29 | Discharge: 2022-06-29 | Disposition: A | Discharge status: Home / Self Care | Payer: 59 | Source: Ambulatory Visit | Attending: Student | Admitting: Student

## 2022-06-29 ENCOUNTER — Telehealth: Payer: Self-pay

## 2022-06-29 DIAGNOSIS — E785 Hyperlipidemia, unspecified: Secondary | ICD-10-CM | POA: Insufficient documentation

## 2022-06-29 DIAGNOSIS — E7800 Pure hypercholesterolemia, unspecified: Secondary | ICD-10-CM

## 2022-06-29 DIAGNOSIS — Z131 Encounter for screening for diabetes mellitus: Secondary | ICD-10-CM | POA: Insufficient documentation

## 2022-06-29 DIAGNOSIS — Z79899 Other long term (current) drug therapy: Secondary | ICD-10-CM | POA: Diagnosis not present

## 2022-06-29 LAB — COMPREHENSIVE METABOLIC PANEL
ALT: 21 U/L (ref 0–44)
AST: 20 U/L (ref 15–41)
Albumin: 3.8 g/dL (ref 3.5–5.0)
Alkaline Phosphatase: 125 U/L (ref 38–126)
Anion gap: 6 (ref 5–15)
BUN: 14 mg/dL (ref 6–20)
CO2: 27 mmol/L (ref 22–32)
Calcium: 8.6 mg/dL — ABNORMAL LOW (ref 8.9–10.3)
Chloride: 106 mmol/L (ref 98–111)
Creatinine, Ser: 1.05 mg/dL (ref 0.61–1.24)
GFR, Estimated: >60 mL/min (ref >60)
Glucose, Bld: 124 mg/dL — ABNORMAL HIGH (ref 70–99)
Potassium: 5 mmol/L (ref 3.5–5.1)
Sodium: 139 mmol/L (ref 135–145)
Total Bilirubin: 0.6 mg/dL (ref 0.3–1.2)
Total Protein: 6.9 g/dL (ref 6.5–8.1)

## 2022-06-29 LAB — LIPID PANEL
Cholesterol: 117 mg/dL (ref 0–200)
HDL: 28 mg/dL — ABNORMAL LOW (ref >40)
LDL Cholesterol: 77 mg/dL (ref 0–99)
Total CHOL/HDL Ratio: 4.2 RATIO
Triglycerides: 59 mg/dL (ref <150)
VLDL: 12 mg/dL (ref 0–40)

## 2022-06-29 LAB — CBC
HCT: 42.5 % (ref 39.0–52.0)
Hemoglobin: 14.5 g/dL (ref 13.0–17.0)
MCH: 32.2 pg (ref 26.0–34.0)
MCHC: 34.1 g/dL (ref 30.0–36.0)
MCV: 94.2 fL (ref 80.0–100.0)
Platelets: 237 10*3/uL (ref 150–400)
RBC: 4.51 MIL/uL (ref 4.22–5.81)
RDW: 13.1 % (ref 11.5–15.5)
WBC: 7.6 10*3/uL (ref 4.0–10.5)
nRBC: 0 % (ref 0.0–0.2)

## 2022-06-29 NOTE — Telephone Encounter (Signed)
-----   Message from Isaiah Serge, NP sent at 06/29/2022  3:23 PM EST ----- Ok sounds good Hepatic and lipid in 3 months ----- Message ----- From: Berlinda Last, CMA Sent: 06/29/2022   3:19 PM EST To: Isaiah Serge, NP  Patient would like to improve diet and recheck in 3 months.

## 2022-06-30 LAB — HEMOGLOBIN A1C
Hgb A1c MFr Bld: 5.7 % — ABNORMAL HIGH (ref 4.8–5.6)
Mean Plasma Glucose: 117 mg/dL

## 2022-08-07 ENCOUNTER — Other Ambulatory Visit (HOSPITAL_COMMUNITY): Payer: Self-pay

## 2022-08-07 ENCOUNTER — Other Ambulatory Visit: Payer: Self-pay

## 2022-08-29 ENCOUNTER — Other Ambulatory Visit: Payer: Self-pay

## 2022-08-31 ENCOUNTER — Encounter: Payer: Self-pay | Admitting: Radiology

## 2022-11-28 ENCOUNTER — Other Ambulatory Visit (HOSPITAL_COMMUNITY): Payer: Self-pay

## 2022-11-28 ENCOUNTER — Other Ambulatory Visit: Payer: Self-pay

## 2023-02-01 ENCOUNTER — Other Ambulatory Visit (HOSPITAL_COMMUNITY): Payer: Self-pay

## 2023-02-25 ENCOUNTER — Other Ambulatory Visit (HOSPITAL_COMMUNITY): Payer: Self-pay

## 2023-02-25 ENCOUNTER — Other Ambulatory Visit: Payer: Self-pay | Admitting: Student

## 2023-02-27 ENCOUNTER — Other Ambulatory Visit: Payer: Self-pay

## 2023-02-27 ENCOUNTER — Other Ambulatory Visit (HOSPITAL_COMMUNITY): Payer: Self-pay

## 2023-02-27 MED ORDER — ATORVASTATIN CALCIUM 80 MG PO TABS
80.0000 mg | ORAL_TABLET | Freq: Every day | ORAL | 3 refills | Status: DC
  Filled 2023-02-27: qty 90, 90d supply, fill #0
  Filled 2023-05-26: qty 90, 90d supply, fill #1
  Filled 2023-08-31: qty 90, 90d supply, fill #2
  Filled 2023-12-06: qty 90, 90d supply, fill #3

## 2023-04-23 ENCOUNTER — Other Ambulatory Visit (HOSPITAL_COMMUNITY): Payer: Self-pay

## 2023-05-20 ENCOUNTER — Other Ambulatory Visit: Payer: Self-pay | Admitting: Student

## 2023-05-23 ENCOUNTER — Other Ambulatory Visit (HOSPITAL_COMMUNITY): Payer: Self-pay

## 2023-05-23 ENCOUNTER — Other Ambulatory Visit: Payer: Self-pay

## 2023-05-23 MED ORDER — SPIRONOLACTONE 25 MG PO TABS
12.5000 mg | ORAL_TABLET | Freq: Every day | ORAL | 2 refills | Status: DC
  Filled 2023-05-23: qty 45, 90d supply, fill #0
  Filled 2023-08-31: qty 45, 90d supply, fill #1
  Filled 2023-12-06: qty 45, 90d supply, fill #2

## 2023-05-23 MED ORDER — CARVEDILOL 12.5 MG PO TABS
12.5000 mg | ORAL_TABLET | Freq: Two times a day (BID) | ORAL | 2 refills | Status: DC
  Filled 2023-05-23: qty 180, 90d supply, fill #0
  Filled 2023-08-31: qty 180, 90d supply, fill #1
  Filled 2023-12-06: qty 180, 90d supply, fill #2

## 2023-05-26 ENCOUNTER — Other Ambulatory Visit (HOSPITAL_COMMUNITY): Payer: Self-pay

## 2023-06-07 DIAGNOSIS — H5213 Myopia, bilateral: Secondary | ICD-10-CM | POA: Diagnosis not present

## 2023-06-13 ENCOUNTER — Encounter: Payer: Self-pay | Admitting: Student

## 2023-06-13 ENCOUNTER — Ambulatory Visit: Payer: Commercial Managed Care - PPO | Attending: Student | Admitting: Student

## 2023-06-13 ENCOUNTER — Other Ambulatory Visit (HOSPITAL_COMMUNITY): Payer: Self-pay

## 2023-06-13 VITALS — BP 162/82 | HR 78 | Ht 72.0 in | Wt 225.0 lb

## 2023-06-13 DIAGNOSIS — Z131 Encounter for screening for diabetes mellitus: Secondary | ICD-10-CM | POA: Diagnosis not present

## 2023-06-13 DIAGNOSIS — Z125 Encounter for screening for malignant neoplasm of prostate: Secondary | ICD-10-CM

## 2023-06-13 DIAGNOSIS — I6523 Occlusion and stenosis of bilateral carotid arteries: Secondary | ICD-10-CM

## 2023-06-13 DIAGNOSIS — E785 Hyperlipidemia, unspecified: Secondary | ICD-10-CM

## 2023-06-13 DIAGNOSIS — I10 Essential (primary) hypertension: Secondary | ICD-10-CM | POA: Diagnosis not present

## 2023-06-13 DIAGNOSIS — I255 Ischemic cardiomyopathy: Secondary | ICD-10-CM

## 2023-06-13 DIAGNOSIS — I2510 Atherosclerotic heart disease of native coronary artery without angina pectoris: Secondary | ICD-10-CM

## 2023-06-13 DIAGNOSIS — Z79899 Other long term (current) drug therapy: Secondary | ICD-10-CM | POA: Diagnosis not present

## 2023-06-13 MED ORDER — LISINOPRIL 40 MG PO TABS
40.0000 mg | ORAL_TABLET | Freq: Every day | ORAL | 3 refills | Status: DC
  Filled 2023-06-13: qty 90, 90d supply, fill #0
  Filled 2023-08-31: qty 90, 90d supply, fill #1
  Filled 2023-12-06: qty 90, 90d supply, fill #2
  Filled 2024-03-29: qty 90, 90d supply, fill #3

## 2023-06-13 NOTE — Progress Notes (Signed)
Cardiology Office Note    Date:  06/13/2023  ID:  [name] PRISOCK, DOB January 01, 1961, MRN [id number] Cardiologist: Previously Dr. Purvis Sheffield   History of Present Illness:    [name] [name] is a 62 y.o. male with past medical history of CAD (s/p CABG in 12/2015 with LIMA-LAD, SVG-OM and SVG-PDA), HFmrEF/ICM (EF 20-25% in 12/2015, at 35-40% by repeat echo in 10/2016 and 50% in 03/2021), carotid artery stenosis (s/p L CEA in 02/2016 and R CEA in 03/2021), HTN, HLD and prior CVA (occurring in 03/2021) who presents to the office today for annual follow-up.  He was examined by myself in 05/2022 and denied any recent anginal symptoms. BP was initially elevated but had improved on recheck and had overall been well-controlled when checked at home. He was continued on his current medical therapy with ASA 81 mg daily, Atorvastatin 80 mg daily, Coreg 12.5 mg twice daily, Lisinopril 20 mg daily and Spironolactone 12.5 mg daily.  In talking with the patient today, he reports he has been under increased stress given his work and also issues at home in closing the estate for his father-in-law. He has not checked his blood pressure at home but it was initially recorded at 180/90 during today's visit and at 162/82 by repeat check. He denies any recent chest pain or dyspnea on exertion. No specific orthopnea, PND or pitting edema. He does not exercise routinely but remains physically active at work and also enjoys going hunting and fishing when at home.  Studies Reviewed:   EKG: EKG is ordered today and demonstrates:   EKG Interpretation Date/Time:  Wednesday June 13 2023 15:11:51 EST Ventricular Rate:  76 PR Interval:  150 QRS Duration:  100 QT Interval:  390 QTC Calculation: 438 R Axis:   53  Text Interpretation: Normal sinus rhythm Inferior infarct pattern LVH with TWI along lateral leads Confirmed by Randall An ([id number]) on 06/13/2023 4:00:50 PM       Echocardiogram: 03/2021 IMPRESSIONS      1. Left ventricular ejection fraction, by estimation, is 50%. The left  ventricle has low normal function. Left ventricular endocardial border not  optimally defined to evaluate regional wall motion. The left ventricular  internal cavity size was  moderately dilated. Left ventricular diastolic parameters are consistent  with Grade I diastolic dysfunction (impaired relaxation).   2. Right ventricular systolic function is normal. The right ventricular  size is mildly enlarged. There is normal pulmonary artery systolic  pressure.   3. Left atrial size was moderately dilated.   4. The mitral valve is normal in structure. Mild mitral valve  regurgitation. No evidence of mitral stenosis.   5. The aortic valve is tricuspid. Aortic valve regurgitation is not  visualized. Mild to moderate aortic valve sclerosis/calcification is  present, without any evidence of aortic stenosis. Aortic valve area, by  VTI measures 2.20 cm. Aortic valve mean  gradient measures 4.0 mmHg. Aortic valve Vmax measures 1.29 m/s.   6. The inferior vena cava is normal in size with greater than 50%  respiratory variability, suggesting right atrial pressure of 3 mmHg.   7. Recommend limited echo with definity to assess LV Apex.   Conclusion(s)/Recommendation(s): No intracardiac source of embolism  detected on this transthoracic study. A transesophageal echocardiogram is  recommended to exclude cardiac source of embolism if clinically indicated.   Event Monitor: 04/2021 ZIO XT reviewed. 13 days, 15 hours analyzed. Predominant rhythm is sinus with heart rate ranging from 53 bpm up to 118 bpm  and average heart rate 77 bpm. There were rare PACs including couplets and triplets representing less than 1% total beats. There were rare PVCs including couplets and triplets representing less than 1% total beats. Limited episodes of ventricular bigeminy and trigeminy were noted. There were 7 episodes of PSVT, the longest of which lasted  for 18 beats with average heart rate 115. No sustained events were noted and there were no pauses.   Risk Assessment/Calculations:     HYPERTENSION CONTROL Vitals:   06/13/23 1502 06/13/23 1514 06/13/23 1538  BP: (!) 180/90 (!) 176/86 (!) 162/82    The patient's blood pressure is elevated above target today.  In order to address the patient's elevated BP: A current anti-hypertensive medication was adjusted today.     Physical Exam:   VS:  BP (!) 162/82   Pulse 78   Ht 6' (1.829 m)   Wt 225 lb (102.1 kg)   SpO2 96%   BMI 30.52 kg/m    Wt Readings from Last 3 Encounters:  06/13/23 225 lb (102.1 kg)  06/01/22 233 lb 9.6 oz (106 kg)  11/30/21 239 lb (108.4 kg)     GEN: Well nourished, well developed male appearing in no acute distress NECK: No JVD; No carotid bruits CARDIAC: RRR, no murmurs, rubs, gallops RESPIRATORY:  Clear to auscultation without rales, wheezing or rhonchi  ABDOMEN: Appears non-distended. No obvious abdominal masses. EXTREMITIES: No clubbing or cyanosis. No pitting edema.  Distal pedal pulses are 2+ bilaterally.   Assessment and Plan:   1. CAD - He underwent CABG in 12/2015 with details as outlined above. He remains active at baseline and denies any recent anginal symptoms. Continue ASA 81 mg daily, Atorvastatin 80 mg daily and Coreg 12.5 mg twice daily.   2. Chronic HFmrEF - His ejection fraction was previously at 20 to 25% in 2017 and has continued to improve, at 50% by most recent check in 03/2021. He denies any recent respiratory issues and appears euvolemic by examination today. Continue Coreg 12.5 mg twice daily, Spironolactone 12.5 mg daily and will increase Lisinopril from 20 mg daily to 40 mg daily given elevated BP.  3. Carotid Artery Stenosis - He underwent left CEA in 02/2016 and right CEA in 03/2021. He has not been evaluated by Vascular Surgery recently and is overdue for carotid dopplers. We discussed obtaining follow-up carotid dopplers  today but he wishes to reestablish with Vascular and plans to do so in 2025. Continue ASA and statin therapy.  4. HTN - His BP has been elevated as discussed above and was still elevated on recheck. He is currently taking Coreg 12.5 mg twice daily, Lisinopril 20 mg daily and Spironolactone 12.5 mg daily. Will increase Lisinopril to 40 mg daily and recheck electrolytes and renal function in 2 to 3 weeks.  5. HLD - His LDL was 77 in 06/2022. He was given the option of adding Zetia or adjusting his diet and wished to focus on diet changes and recheck in 3 months. It does not appear that follow-up labs have been obtained in the interim. Will recheck with upcoming labs.  6. Screening for Prostate Cancer - He requests to have a PSA checked with upcoming labs and this will be ordered.  7. Prediabetes - Hgb A1c was at 5.7 when checked in 06/2022. Will recheck with upcoming labs.  Signed, Ellsworth Lennox, PA-C

## 2023-06-13 NOTE — Patient Instructions (Addendum)
Medication Instructions:  Your physician has recommended you make the following change in your medication:  Increase lisinopril to 40 mg daily. You may take (2) of your 20 mg daily until they are finished Continue all other medications the same  Labwork: Your physician recommends that you return for a FASTING lipid profile, CMET, CBC, PSA & HgA1C. Please do not eat or drink for at least 8 hours when you have this done. You may take your medications that morning with a sip of water. [facility]  Testing/Procedures: none  Follow-Up: Your physician recommends that you schedule a follow-up appointment in: 1 year. You will receive a reminder call in about 10 months reminding you to schedule your appointment. If you don't receive this call, please contact our office.  Any Other Special Instructions Will Be Listed Below (If Applicable).  If you need a refill on your cardiac medications before your next appointment, please call your pharmacy.

## 2023-06-14 ENCOUNTER — Other Ambulatory Visit: Payer: Self-pay

## 2023-06-29 ENCOUNTER — Other Ambulatory Visit (HOSPITAL_COMMUNITY)
Admission: RE | Admit: 2023-06-29 | Discharge: 2023-06-29 | Disposition: A | Discharge status: Home / Self Care | Payer: Commercial Managed Care - PPO | Source: Ambulatory Visit | Attending: Student | Admitting: Student

## 2023-06-29 DIAGNOSIS — I10 Essential (primary) hypertension: Secondary | ICD-10-CM | POA: Insufficient documentation

## 2023-06-29 DIAGNOSIS — I2510 Atherosclerotic heart disease of native coronary artery without angina pectoris: Secondary | ICD-10-CM | POA: Insufficient documentation

## 2023-06-29 DIAGNOSIS — E785 Hyperlipidemia, unspecified: Secondary | ICD-10-CM | POA: Insufficient documentation

## 2023-06-29 DIAGNOSIS — Z125 Encounter for screening for malignant neoplasm of prostate: Secondary | ICD-10-CM | POA: Insufficient documentation

## 2023-06-29 DIAGNOSIS — Z131 Encounter for screening for diabetes mellitus: Secondary | ICD-10-CM | POA: Insufficient documentation

## 2023-06-29 DIAGNOSIS — I6523 Occlusion and stenosis of bilateral carotid arteries: Secondary | ICD-10-CM | POA: Diagnosis not present

## 2023-06-29 DIAGNOSIS — Z79899 Other long term (current) drug therapy: Secondary | ICD-10-CM | POA: Insufficient documentation

## 2023-06-29 LAB — LIPID PANEL
Cholesterol: 101 mg/dL (ref 0–200)
HDL: 30 mg/dL — ABNORMAL LOW (ref >40)
LDL Cholesterol: 62 mg/dL (ref 0–99)
Total CHOL/HDL Ratio: 3.4 {ratio}
Triglycerides: 46 mg/dL (ref <150)
VLDL: 9 mg/dL (ref 0–40)

## 2023-06-29 LAB — CBC
HCT: 42.5 % (ref 39.0–52.0)
Hemoglobin: 14.8 g/dL (ref 13.0–17.0)
MCH: 33.1 pg (ref 26.0–34.0)
MCHC: 34.8 g/dL (ref 30.0–36.0)
MCV: 95.1 fL (ref 80.0–100.0)
Platelets: 143 10*3/uL — ABNORMAL LOW (ref 150–400)
RBC: 4.47 MIL/uL (ref 4.22–5.81)
RDW: 12.9 % (ref 11.5–15.5)
WBC: 7.3 10*3/uL (ref 4.0–10.5)
nRBC: 0 % (ref 0.0–0.2)

## 2023-06-29 LAB — HEMOGLOBIN A1C
Hgb A1c MFr Bld: 5.8 % — ABNORMAL HIGH (ref 4.8–5.6)
Mean Plasma Glucose: 120 mg/dL

## 2023-06-29 LAB — COMPREHENSIVE METABOLIC PANEL
ALT: 21 U/L (ref 0–44)
AST: 20 U/L (ref 15–41)
Albumin: 3.8 g/dL (ref 3.5–5.0)
Alkaline Phosphatase: 120 U/L (ref 38–126)
Anion gap: 5 (ref 5–15)
BUN: 19 mg/dL (ref 6–20)
CO2: 25 mmol/L (ref 22–32)
Calcium: 8.4 mg/dL — ABNORMAL LOW (ref 8.9–10.3)
Chloride: 105 mmol/L (ref 98–111)
Creatinine, Ser: 0.98 mg/dL (ref 0.61–1.24)
GFR, Estimated: >60 mL/min (ref >60)
Glucose, Bld: 111 mg/dL — ABNORMAL HIGH (ref 70–99)
Potassium: 4.1 mmol/L (ref 3.5–5.1)
Sodium: 135 mmol/L (ref 135–145)
Total Bilirubin: 1 mg/dL (ref <1.2)
Total Protein: 6.8 g/dL (ref 6.5–8.1)

## 2023-06-30 LAB — PSA: Prostatic Specific Antigen: 0.6 ng/mL (ref 0.00–4.00)

## 2023-08-01 ENCOUNTER — Telehealth: Payer: Self-pay

## 2023-08-01 NOTE — Telephone Encounter (Signed)
Patient is wanting to know if he can get a letter letting [facility] know that he will need to stay on first shift due to being able to care for his wife. He is her transportation as well.

## 2023-08-02 ENCOUNTER — Encounter: Payer: Self-pay | Admitting: Family Medicine

## 2023-08-02 NOTE — Progress Notes (Signed)
Please print for patient.

## 2023-08-02 NOTE — Telephone Encounter (Signed)
Letter was dictated he can pick this up please assist

## 2023-08-31 ENCOUNTER — Other Ambulatory Visit: Payer: Self-pay | Admitting: Family Medicine

## 2023-08-31 ENCOUNTER — Other Ambulatory Visit (HOSPITAL_COMMUNITY): Payer: Self-pay

## 2023-08-31 MED ORDER — FUROSEMIDE 20 MG PO TABS
20.0000 mg | ORAL_TABLET | Freq: Every day | ORAL | 1 refills | Status: AC | PRN
  Filled 2023-08-31 (×2): qty 90, 90d supply, fill #0

## 2023-12-06 ENCOUNTER — Other Ambulatory Visit (HOSPITAL_COMMUNITY): Payer: Self-pay

## 2023-12-10 ENCOUNTER — Other Ambulatory Visit (HOSPITAL_COMMUNITY): Payer: Self-pay

## 2024-03-29 ENCOUNTER — Other Ambulatory Visit: Payer: Self-pay | Admitting: Student

## 2024-03-31 ENCOUNTER — Other Ambulatory Visit: Payer: Self-pay

## 2024-03-31 ENCOUNTER — Other Ambulatory Visit (HOSPITAL_COMMUNITY): Payer: Self-pay

## 2024-03-31 MED ORDER — CARVEDILOL 12.5 MG PO TABS
12.5000 mg | ORAL_TABLET | Freq: Two times a day (BID) | ORAL | 0 refills | Status: DC
  Filled 2024-03-31: qty 180, 90d supply, fill #0

## 2024-03-31 MED ORDER — ATORVASTATIN CALCIUM 80 MG PO TABS
80.0000 mg | ORAL_TABLET | Freq: Every day | ORAL | 0 refills | Status: DC
  Filled 2024-03-31: qty 90, 90d supply, fill #0

## 2024-03-31 MED ORDER — SPIRONOLACTONE 25 MG PO TABS
12.5000 mg | ORAL_TABLET | Freq: Every day | ORAL | 0 refills | Status: DC
  Filled 2024-03-31: qty 45, 90d supply, fill #0

## 2024-07-02 ENCOUNTER — Ambulatory Visit: Admitting: Student

## 2024-07-16 ENCOUNTER — Other Ambulatory Visit: Payer: Self-pay | Admitting: Student

## 2024-07-17 ENCOUNTER — Other Ambulatory Visit (HOSPITAL_COMMUNITY): Payer: Self-pay

## 2024-07-17 ENCOUNTER — Other Ambulatory Visit: Payer: Self-pay

## 2024-07-17 MED ORDER — LISINOPRIL 40 MG PO TABS
40.0000 mg | ORAL_TABLET | Freq: Every day | ORAL | 3 refills | Status: DC
  Filled 2024-07-17: qty 90, 90d supply, fill #0

## 2024-07-17 MED ORDER — SPIRONOLACTONE 25 MG PO TABS
12.5000 mg | ORAL_TABLET | Freq: Every day | ORAL | 0 refills | Status: DC
  Filled 2024-07-17: qty 45, 90d supply, fill #0

## 2024-07-17 MED ORDER — CARVEDILOL 12.5 MG PO TABS
12.5000 mg | ORAL_TABLET | Freq: Two times a day (BID) | ORAL | 0 refills | Status: DC
  Filled 2024-07-17: qty 180, 90d supply, fill #0

## 2024-07-17 MED ORDER — ATORVASTATIN CALCIUM 80 MG PO TABS
80.0000 mg | ORAL_TABLET | Freq: Every day | ORAL | 0 refills | Status: DC
  Filled 2024-07-17: qty 90, 90d supply, fill #0

## 2024-07-17 NOTE — Telephone Encounter (Signed)
 In accordance with refill protocols, please review and address the following requirements before this medication refill can be authorized:  Labs

## 2024-08-05 ENCOUNTER — Other Ambulatory Visit: Payer: Self-pay

## 2024-08-05 ENCOUNTER — Ambulatory Visit: Admitting: Student

## 2024-08-05 ENCOUNTER — Other Ambulatory Visit (HOSPITAL_COMMUNITY): Payer: Self-pay

## 2024-08-05 ENCOUNTER — Encounter: Payer: Self-pay | Admitting: Student

## 2024-08-05 VITALS — BP 132/74 | HR 60 | Ht 72.0 in | Wt 228.4 lb

## 2024-08-05 DIAGNOSIS — E785 Hyperlipidemia, unspecified: Secondary | ICD-10-CM | POA: Diagnosis not present

## 2024-08-05 DIAGNOSIS — I2510 Atherosclerotic heart disease of native coronary artery without angina pectoris: Secondary | ICD-10-CM | POA: Diagnosis not present

## 2024-08-05 DIAGNOSIS — I10 Essential (primary) hypertension: Secondary | ICD-10-CM

## 2024-08-05 DIAGNOSIS — Z125 Encounter for screening for malignant neoplasm of prostate: Secondary | ICD-10-CM

## 2024-08-05 DIAGNOSIS — Z131 Encounter for screening for diabetes mellitus: Secondary | ICD-10-CM

## 2024-08-05 DIAGNOSIS — I5020 Unspecified systolic (congestive) heart failure: Secondary | ICD-10-CM | POA: Diagnosis not present

## 2024-08-05 DIAGNOSIS — I6523 Occlusion and stenosis of bilateral carotid arteries: Secondary | ICD-10-CM | POA: Diagnosis not present

## 2024-08-05 MED ORDER — LISINOPRIL 40 MG PO TABS
40.0000 mg | ORAL_TABLET | Freq: Every day | ORAL | 3 refills | Status: AC
  Filled 2024-08-05: qty 90, 90d supply, fill #0

## 2024-08-05 MED ORDER — CARVEDILOL 12.5 MG PO TABS
12.5000 mg | ORAL_TABLET | Freq: Two times a day (BID) | ORAL | 3 refills | Status: AC
  Filled 2024-08-05: qty 180, 90d supply, fill #0

## 2024-08-05 MED ORDER — ATORVASTATIN CALCIUM 80 MG PO TABS
80.0000 mg | ORAL_TABLET | Freq: Every day | ORAL | 3 refills | Status: AC
  Filled 2024-08-05: qty 90, 90d supply, fill #0

## 2024-08-05 MED ORDER — SPIRONOLACTONE 25 MG PO TABS
12.5000 mg | ORAL_TABLET | Freq: Every day | ORAL | 3 refills | Status: AC
  Filled 2024-08-05: qty 45, 90d supply, fill #0

## 2024-09-15 ENCOUNTER — Ambulatory Visit (HOSPITAL_COMMUNITY)

## 2024-09-15 ENCOUNTER — Encounter: Admitting: Surgery
